# Patient Record
Sex: Male | Born: 1945
Health system: Southern US, Community
[De-identification: ages and names within clinical notes are randomized; demographics above are authoritative.]

## PROBLEM LIST (undated history)

## (undated) DIAGNOSIS — E785 Hyperlipidemia, unspecified: Secondary | ICD-10-CM

## (undated) DIAGNOSIS — E119 Type 2 diabetes mellitus without complications: Secondary | ICD-10-CM

## (undated) DIAGNOSIS — I2510 Atherosclerotic heart disease of native coronary artery without angina pectoris: Secondary | ICD-10-CM

## (undated) DIAGNOSIS — I10 Essential (primary) hypertension: Secondary | ICD-10-CM

## (undated) HISTORY — DX: Hyperlipidemia, unspecified: E78.5

## (undated) HISTORY — PX: BACK SURGERY: SHX140

## (undated) HISTORY — PX: CHOLECYSTECTOMY: SHX55

## (undated) HISTORY — PX: APPENDECTOMY: SHX54

---

## 1996-07-31 HISTORY — PX: CORONARY ANGIOPLASTY WITH STENT PLACEMENT: SHX49

## 2015-09-02 DIAGNOSIS — M79661 Pain in right lower leg: Secondary | ICD-10-CM | POA: Diagnosis not present

## 2015-09-02 DIAGNOSIS — Z6827 Body mass index (BMI) 27.0-27.9, adult: Secondary | ICD-10-CM | POA: Diagnosis not present

## 2015-09-02 DIAGNOSIS — D509 Iron deficiency anemia, unspecified: Secondary | ICD-10-CM | POA: Diagnosis not present

## 2015-09-02 DIAGNOSIS — E785 Hyperlipidemia, unspecified: Secondary | ICD-10-CM | POA: Diagnosis not present

## 2015-09-02 DIAGNOSIS — I10 Essential (primary) hypertension: Secondary | ICD-10-CM | POA: Diagnosis not present

## 2015-09-02 DIAGNOSIS — R609 Edema, unspecified: Secondary | ICD-10-CM | POA: Diagnosis not present

## 2015-09-02 DIAGNOSIS — M7989 Other specified soft tissue disorders: Secondary | ICD-10-CM | POA: Diagnosis not present

## 2015-09-02 DIAGNOSIS — Z23 Encounter for immunization: Secondary | ICD-10-CM | POA: Diagnosis not present

## 2015-09-02 DIAGNOSIS — E1140 Type 2 diabetes mellitus with diabetic neuropathy, unspecified: Secondary | ICD-10-CM | POA: Diagnosis not present

## 2015-12-08 DIAGNOSIS — D509 Iron deficiency anemia, unspecified: Secondary | ICD-10-CM | POA: Diagnosis not present

## 2015-12-08 DIAGNOSIS — Z139 Encounter for screening, unspecified: Secondary | ICD-10-CM | POA: Diagnosis not present

## 2015-12-08 DIAGNOSIS — K529 Noninfective gastroenteritis and colitis, unspecified: Secondary | ICD-10-CM | POA: Diagnosis not present

## 2015-12-08 DIAGNOSIS — I10 Essential (primary) hypertension: Secondary | ICD-10-CM | POA: Diagnosis not present

## 2015-12-08 DIAGNOSIS — E1140 Type 2 diabetes mellitus with diabetic neuropathy, unspecified: Secondary | ICD-10-CM | POA: Diagnosis not present

## 2015-12-08 DIAGNOSIS — E663 Overweight: Secondary | ICD-10-CM | POA: Diagnosis not present

## 2015-12-08 DIAGNOSIS — E785 Hyperlipidemia, unspecified: Secondary | ICD-10-CM | POA: Diagnosis not present

## 2015-12-08 DIAGNOSIS — Z6826 Body mass index (BMI) 26.0-26.9, adult: Secondary | ICD-10-CM | POA: Diagnosis not present

## 2015-12-08 DIAGNOSIS — G4762 Sleep related leg cramps: Secondary | ICD-10-CM | POA: Diagnosis not present

## 2015-12-23 DIAGNOSIS — R480 Dyslexia and alexia: Secondary | ICD-10-CM | POA: Diagnosis not present

## 2015-12-23 DIAGNOSIS — F419 Anxiety disorder, unspecified: Secondary | ICD-10-CM | POA: Diagnosis not present

## 2015-12-23 DIAGNOSIS — M5136 Other intervertebral disc degeneration, lumbar region: Secondary | ICD-10-CM | POA: Diagnosis not present

## 2015-12-23 DIAGNOSIS — M159 Polyosteoarthritis, unspecified: Secondary | ICD-10-CM | POA: Diagnosis not present

## 2015-12-23 DIAGNOSIS — M5417 Radiculopathy, lumbosacral region: Secondary | ICD-10-CM | POA: Diagnosis not present

## 2016-01-20 DIAGNOSIS — N132 Hydronephrosis with renal and ureteral calculous obstruction: Secondary | ICD-10-CM | POA: Diagnosis not present

## 2016-01-20 DIAGNOSIS — N23 Unspecified renal colic: Secondary | ICD-10-CM | POA: Diagnosis not present

## 2016-01-20 DIAGNOSIS — M47815 Spondylosis without myelopathy or radiculopathy, thoracolumbar region: Secondary | ICD-10-CM | POA: Diagnosis not present

## 2016-01-20 DIAGNOSIS — I252 Old myocardial infarction: Secondary | ICD-10-CM | POA: Diagnosis not present

## 2016-01-20 DIAGNOSIS — K429 Umbilical hernia without obstruction or gangrene: Secondary | ICD-10-CM | POA: Diagnosis not present

## 2016-01-20 DIAGNOSIS — N281 Cyst of kidney, acquired: Secondary | ICD-10-CM | POA: Diagnosis not present

## 2016-01-20 DIAGNOSIS — Q438 Other specified congenital malformations of intestine: Secondary | ICD-10-CM | POA: Diagnosis not present

## 2016-01-20 DIAGNOSIS — N200 Calculus of kidney: Secondary | ICD-10-CM | POA: Diagnosis not present

## 2016-01-20 DIAGNOSIS — N134 Hydroureter: Secondary | ICD-10-CM | POA: Diagnosis not present

## 2016-01-20 DIAGNOSIS — R161 Splenomegaly, not elsewhere classified: Secondary | ICD-10-CM | POA: Diagnosis not present

## 2016-01-20 DIAGNOSIS — J449 Chronic obstructive pulmonary disease, unspecified: Secondary | ICD-10-CM | POA: Diagnosis not present

## 2016-01-20 DIAGNOSIS — R112 Nausea with vomiting, unspecified: Secondary | ICD-10-CM | POA: Diagnosis not present

## 2016-01-20 DIAGNOSIS — I10 Essential (primary) hypertension: Secondary | ICD-10-CM | POA: Diagnosis not present

## 2016-01-20 DIAGNOSIS — K7689 Other specified diseases of liver: Secondary | ICD-10-CM | POA: Diagnosis not present

## 2016-01-20 DIAGNOSIS — E119 Type 2 diabetes mellitus without complications: Secondary | ICD-10-CM | POA: Diagnosis not present

## 2016-01-20 DIAGNOSIS — R1032 Left lower quadrant pain: Secondary | ICD-10-CM | POA: Diagnosis not present

## 2016-02-14 DIAGNOSIS — R1032 Left lower quadrant pain: Secondary | ICD-10-CM | POA: Diagnosis not present

## 2016-02-14 DIAGNOSIS — N200 Calculus of kidney: Secondary | ICD-10-CM | POA: Diagnosis not present

## 2016-03-16 DIAGNOSIS — Z79899 Other long term (current) drug therapy: Secondary | ICD-10-CM | POA: Diagnosis not present

## 2016-03-16 DIAGNOSIS — D509 Iron deficiency anemia, unspecified: Secondary | ICD-10-CM | POA: Diagnosis not present

## 2016-03-16 DIAGNOSIS — E1140 Type 2 diabetes mellitus with diabetic neuropathy, unspecified: Secondary | ICD-10-CM | POA: Diagnosis not present

## 2016-03-16 DIAGNOSIS — E785 Hyperlipidemia, unspecified: Secondary | ICD-10-CM | POA: Diagnosis not present

## 2016-03-16 DIAGNOSIS — I10 Essential (primary) hypertension: Secondary | ICD-10-CM | POA: Diagnosis not present

## 2016-03-16 DIAGNOSIS — Z9181 History of falling: Secondary | ICD-10-CM | POA: Diagnosis not present

## 2016-03-16 DIAGNOSIS — Z125 Encounter for screening for malignant neoplasm of prostate: Secondary | ICD-10-CM | POA: Diagnosis not present

## 2016-03-16 DIAGNOSIS — Z1389 Encounter for screening for other disorder: Secondary | ICD-10-CM | POA: Diagnosis not present

## 2016-03-16 DIAGNOSIS — Z6825 Body mass index (BMI) 25.0-25.9, adult: Secondary | ICD-10-CM | POA: Diagnosis not present

## 2016-06-19 DIAGNOSIS — D509 Iron deficiency anemia, unspecified: Secondary | ICD-10-CM | POA: Diagnosis not present

## 2016-06-19 DIAGNOSIS — E785 Hyperlipidemia, unspecified: Secondary | ICD-10-CM | POA: Diagnosis not present

## 2016-06-19 DIAGNOSIS — K529 Noninfective gastroenteritis and colitis, unspecified: Secondary | ICD-10-CM | POA: Diagnosis not present

## 2016-06-19 DIAGNOSIS — I10 Essential (primary) hypertension: Secondary | ICD-10-CM | POA: Diagnosis not present

## 2016-06-19 DIAGNOSIS — Z6826 Body mass index (BMI) 26.0-26.9, adult: Secondary | ICD-10-CM | POA: Diagnosis not present

## 2016-06-19 DIAGNOSIS — E1140 Type 2 diabetes mellitus with diabetic neuropathy, unspecified: Secondary | ICD-10-CM | POA: Diagnosis not present

## 2016-06-27 DIAGNOSIS — M5136 Other intervertebral disc degeneration, lumbar region: Secondary | ICD-10-CM

## 2016-06-27 DIAGNOSIS — F419 Anxiety disorder, unspecified: Secondary | ICD-10-CM | POA: Insufficient documentation

## 2016-06-27 DIAGNOSIS — M5417 Radiculopathy, lumbosacral region: Secondary | ICD-10-CM | POA: Insufficient documentation

## 2016-06-27 DIAGNOSIS — R480 Dyslexia and alexia: Secondary | ICD-10-CM | POA: Insufficient documentation

## 2016-06-27 DIAGNOSIS — M51369 Other intervertebral disc degeneration, lumbar region without mention of lumbar back pain or lower extremity pain: Secondary | ICD-10-CM | POA: Insufficient documentation

## 2016-06-27 DIAGNOSIS — M159 Polyosteoarthritis, unspecified: Secondary | ICD-10-CM

## 2016-06-27 HISTORY — DX: Other intervertebral disc degeneration, lumbar region: M51.36

## 2016-06-27 HISTORY — DX: Other intervertebral disc degeneration, lumbar region without mention of lumbar back pain or lower extremity pain: M51.369

## 2016-06-27 HISTORY — DX: Radiculopathy, lumbosacral region: M54.17

## 2016-06-27 HISTORY — DX: Dyslexia and alexia: R48.0

## 2016-06-27 HISTORY — DX: Polyosteoarthritis, unspecified: M15.9

## 2016-06-27 HISTORY — DX: Anxiety disorder, unspecified: F41.9

## 2016-09-28 DIAGNOSIS — I10 Essential (primary) hypertension: Secondary | ICD-10-CM | POA: Diagnosis not present

## 2016-09-28 DIAGNOSIS — E1140 Type 2 diabetes mellitus with diabetic neuropathy, unspecified: Secondary | ICD-10-CM | POA: Diagnosis not present

## 2016-09-28 DIAGNOSIS — K219 Gastro-esophageal reflux disease without esophagitis: Secondary | ICD-10-CM | POA: Diagnosis not present

## 2016-09-28 DIAGNOSIS — E785 Hyperlipidemia, unspecified: Secondary | ICD-10-CM | POA: Diagnosis not present

## 2016-09-28 DIAGNOSIS — Z6825 Body mass index (BMI) 25.0-25.9, adult: Secondary | ICD-10-CM | POA: Diagnosis not present

## 2016-09-28 DIAGNOSIS — D509 Iron deficiency anemia, unspecified: Secondary | ICD-10-CM | POA: Diagnosis not present

## 2016-12-26 DIAGNOSIS — R480 Dyslexia and alexia: Secondary | ICD-10-CM | POA: Diagnosis not present

## 2016-12-26 DIAGNOSIS — M159 Polyosteoarthritis, unspecified: Secondary | ICD-10-CM | POA: Diagnosis not present

## 2016-12-26 DIAGNOSIS — M5417 Radiculopathy, lumbosacral region: Secondary | ICD-10-CM | POA: Diagnosis not present

## 2016-12-26 DIAGNOSIS — F419 Anxiety disorder, unspecified: Secondary | ICD-10-CM | POA: Diagnosis not present

## 2016-12-26 DIAGNOSIS — M5136 Other intervertebral disc degeneration, lumbar region: Secondary | ICD-10-CM | POA: Diagnosis not present

## 2017-01-11 DIAGNOSIS — E785 Hyperlipidemia, unspecified: Secondary | ICD-10-CM | POA: Diagnosis not present

## 2017-01-11 DIAGNOSIS — K219 Gastro-esophageal reflux disease without esophagitis: Secondary | ICD-10-CM | POA: Diagnosis not present

## 2017-01-11 DIAGNOSIS — I10 Essential (primary) hypertension: Secondary | ICD-10-CM | POA: Diagnosis not present

## 2017-01-11 DIAGNOSIS — E1140 Type 2 diabetes mellitus with diabetic neuropathy, unspecified: Secondary | ICD-10-CM | POA: Diagnosis not present

## 2017-01-11 DIAGNOSIS — Z6825 Body mass index (BMI) 25.0-25.9, adult: Secondary | ICD-10-CM | POA: Diagnosis not present

## 2017-01-11 DIAGNOSIS — Z139 Encounter for screening, unspecified: Secondary | ICD-10-CM | POA: Diagnosis not present

## 2017-01-11 DIAGNOSIS — D509 Iron deficiency anemia, unspecified: Secondary | ICD-10-CM | POA: Diagnosis not present

## 2017-04-27 DIAGNOSIS — E1140 Type 2 diabetes mellitus with diabetic neuropathy, unspecified: Secondary | ICD-10-CM | POA: Diagnosis not present

## 2017-04-27 DIAGNOSIS — I10 Essential (primary) hypertension: Secondary | ICD-10-CM | POA: Diagnosis not present

## 2017-04-27 DIAGNOSIS — D509 Iron deficiency anemia, unspecified: Secondary | ICD-10-CM | POA: Diagnosis not present

## 2017-04-27 DIAGNOSIS — K219 Gastro-esophageal reflux disease without esophagitis: Secondary | ICD-10-CM | POA: Diagnosis not present

## 2017-04-27 DIAGNOSIS — Z1389 Encounter for screening for other disorder: Secondary | ICD-10-CM | POA: Diagnosis not present

## 2017-04-27 DIAGNOSIS — Z9181 History of falling: Secondary | ICD-10-CM | POA: Diagnosis not present

## 2017-04-27 DIAGNOSIS — E785 Hyperlipidemia, unspecified: Secondary | ICD-10-CM | POA: Diagnosis not present

## 2017-04-27 DIAGNOSIS — Z125 Encounter for screening for malignant neoplasm of prostate: Secondary | ICD-10-CM | POA: Diagnosis not present

## 2017-04-27 DIAGNOSIS — Z139 Encounter for screening, unspecified: Secondary | ICD-10-CM | POA: Diagnosis not present

## 2017-08-10 DIAGNOSIS — Z6825 Body mass index (BMI) 25.0-25.9, adult: Secondary | ICD-10-CM | POA: Diagnosis not present

## 2017-08-10 DIAGNOSIS — E1140 Type 2 diabetes mellitus with diabetic neuropathy, unspecified: Secondary | ICD-10-CM | POA: Diagnosis not present

## 2017-08-10 DIAGNOSIS — E785 Hyperlipidemia, unspecified: Secondary | ICD-10-CM | POA: Diagnosis not present

## 2017-08-10 DIAGNOSIS — D509 Iron deficiency anemia, unspecified: Secondary | ICD-10-CM | POA: Diagnosis not present

## 2017-08-10 DIAGNOSIS — I10 Essential (primary) hypertension: Secondary | ICD-10-CM | POA: Diagnosis not present

## 2017-08-10 DIAGNOSIS — K219 Gastro-esophageal reflux disease without esophagitis: Secondary | ICD-10-CM | POA: Diagnosis not present

## 2017-10-19 DIAGNOSIS — I10 Essential (primary) hypertension: Secondary | ICD-10-CM | POA: Diagnosis not present

## 2017-10-19 DIAGNOSIS — E782 Mixed hyperlipidemia: Secondary | ICD-10-CM | POA: Diagnosis not present

## 2017-10-19 DIAGNOSIS — E559 Vitamin D deficiency, unspecified: Secondary | ICD-10-CM | POA: Diagnosis not present

## 2017-10-19 DIAGNOSIS — Z79899 Other long term (current) drug therapy: Secondary | ICD-10-CM | POA: Diagnosis not present

## 2017-10-19 DIAGNOSIS — E038 Other specified hypothyroidism: Secondary | ICD-10-CM | POA: Diagnosis not present

## 2017-10-19 DIAGNOSIS — E1165 Type 2 diabetes mellitus with hyperglycemia: Secondary | ICD-10-CM | POA: Diagnosis not present

## 2017-10-19 DIAGNOSIS — Z0000 Encounter for general adult medical examination without abnormal findings: Secondary | ICD-10-CM | POA: Diagnosis not present

## 2017-10-19 DIAGNOSIS — N401 Enlarged prostate with lower urinary tract symptoms: Secondary | ICD-10-CM | POA: Diagnosis not present

## 2017-10-19 DIAGNOSIS — D518 Other vitamin B12 deficiency anemias: Secondary | ICD-10-CM | POA: Diagnosis not present

## 2017-11-09 DIAGNOSIS — I10 Essential (primary) hypertension: Secondary | ICD-10-CM | POA: Diagnosis not present

## 2017-11-09 DIAGNOSIS — K219 Gastro-esophageal reflux disease without esophagitis: Secondary | ICD-10-CM | POA: Diagnosis not present

## 2017-11-09 DIAGNOSIS — D509 Iron deficiency anemia, unspecified: Secondary | ICD-10-CM | POA: Diagnosis not present

## 2017-11-09 DIAGNOSIS — Z6825 Body mass index (BMI) 25.0-25.9, adult: Secondary | ICD-10-CM | POA: Diagnosis not present

## 2017-11-09 DIAGNOSIS — E1140 Type 2 diabetes mellitus with diabetic neuropathy, unspecified: Secondary | ICD-10-CM | POA: Diagnosis not present

## 2017-11-09 DIAGNOSIS — M25512 Pain in left shoulder: Secondary | ICD-10-CM | POA: Diagnosis not present

## 2017-11-09 DIAGNOSIS — E785 Hyperlipidemia, unspecified: Secondary | ICD-10-CM | POA: Diagnosis not present

## 2018-02-22 DIAGNOSIS — D509 Iron deficiency anemia, unspecified: Secondary | ICD-10-CM | POA: Diagnosis not present

## 2018-02-22 DIAGNOSIS — E785 Hyperlipidemia, unspecified: Secondary | ICD-10-CM | POA: Diagnosis not present

## 2018-02-22 DIAGNOSIS — E1140 Type 2 diabetes mellitus with diabetic neuropathy, unspecified: Secondary | ICD-10-CM | POA: Diagnosis not present

## 2018-05-28 DIAGNOSIS — F1010 Alcohol abuse, uncomplicated: Secondary | ICD-10-CM | POA: Diagnosis not present

## 2018-05-28 DIAGNOSIS — Z9181 History of falling: Secondary | ICD-10-CM | POA: Diagnosis not present

## 2018-05-28 DIAGNOSIS — Z139 Encounter for screening, unspecified: Secondary | ICD-10-CM | POA: Diagnosis not present

## 2018-05-28 DIAGNOSIS — I10 Essential (primary) hypertension: Secondary | ICD-10-CM | POA: Diagnosis not present

## 2018-05-28 DIAGNOSIS — Z1331 Encounter for screening for depression: Secondary | ICD-10-CM | POA: Diagnosis not present

## 2018-05-28 DIAGNOSIS — K219 Gastro-esophageal reflux disease without esophagitis: Secondary | ICD-10-CM | POA: Diagnosis not present

## 2018-05-28 DIAGNOSIS — D509 Iron deficiency anemia, unspecified: Secondary | ICD-10-CM | POA: Diagnosis not present

## 2018-05-28 DIAGNOSIS — E1140 Type 2 diabetes mellitus with diabetic neuropathy, unspecified: Secondary | ICD-10-CM | POA: Diagnosis not present

## 2018-05-28 DIAGNOSIS — E785 Hyperlipidemia, unspecified: Secondary | ICD-10-CM | POA: Diagnosis not present

## 2018-05-28 DIAGNOSIS — Z1339 Encounter for screening examination for other mental health and behavioral disorders: Secondary | ICD-10-CM | POA: Diagnosis not present

## 2018-11-19 ENCOUNTER — Emergency Department (HOSPITAL_COMMUNITY): Payer: PPO

## 2018-11-19 ENCOUNTER — Inpatient Hospital Stay (HOSPITAL_COMMUNITY)
Admission: EM | Disposition: A | Discharge status: Home Health | Payer: Self-pay | Source: Home / Self Care | Attending: Surgery

## 2018-11-19 ENCOUNTER — Other Ambulatory Visit: Payer: Self-pay

## 2018-11-19 ENCOUNTER — Inpatient Hospital Stay (HOSPITAL_COMMUNITY)
Admission: EM | Admit: 2018-11-19 | Discharge: 2018-11-27 | DRG: 234 | Disposition: A | Discharge status: Home Health | Payer: PPO | Attending: Surgery | Admitting: Surgery

## 2018-11-19 ENCOUNTER — Inpatient Hospital Stay (HOSPITAL_COMMUNITY): Payer: PPO

## 2018-11-19 ENCOUNTER — Encounter (HOSPITAL_COMMUNITY): Payer: Self-pay | Admitting: Emergency Medicine

## 2018-11-19 ENCOUNTER — Other Ambulatory Visit (HOSPITAL_COMMUNITY): Payer: PPO

## 2018-11-19 DIAGNOSIS — Z955 Presence of coronary angioplasty implant and graft: Secondary | ICD-10-CM

## 2018-11-19 DIAGNOSIS — F17220 Nicotine dependence, chewing tobacco, uncomplicated: Secondary | ICD-10-CM | POA: Diagnosis not present

## 2018-11-19 DIAGNOSIS — K429 Umbilical hernia without obstruction or gangrene: Secondary | ICD-10-CM | POA: Diagnosis present

## 2018-11-19 DIAGNOSIS — Z7982 Long term (current) use of aspirin: Secondary | ICD-10-CM

## 2018-11-19 DIAGNOSIS — D62 Acute posthemorrhagic anemia: Secondary | ICD-10-CM | POA: Diagnosis not present

## 2018-11-19 DIAGNOSIS — J9811 Atelectasis: Secondary | ICD-10-CM | POA: Diagnosis not present

## 2018-11-19 DIAGNOSIS — I252 Old myocardial infarction: Secondary | ICD-10-CM | POA: Diagnosis not present

## 2018-11-19 DIAGNOSIS — Z7984 Long term (current) use of oral hypoglycemic drugs: Secondary | ICD-10-CM

## 2018-11-19 DIAGNOSIS — E119 Type 2 diabetes mellitus without complications: Secondary | ICD-10-CM | POA: Diagnosis not present

## 2018-11-19 DIAGNOSIS — I2510 Atherosclerotic heart disease of native coronary artery without angina pectoris: Secondary | ICD-10-CM | POA: Diagnosis not present

## 2018-11-19 DIAGNOSIS — I10 Essential (primary) hypertension: Secondary | ICD-10-CM

## 2018-11-19 DIAGNOSIS — I313 Pericardial effusion (noninflammatory): Secondary | ICD-10-CM | POA: Diagnosis not present

## 2018-11-19 DIAGNOSIS — I214 Non-ST elevation (NSTEMI) myocardial infarction: Secondary | ICD-10-CM | POA: Diagnosis not present

## 2018-11-19 DIAGNOSIS — I351 Nonrheumatic aortic (valve) insufficiency: Secondary | ICD-10-CM | POA: Diagnosis not present

## 2018-11-19 DIAGNOSIS — D696 Thrombocytopenia, unspecified: Secondary | ICD-10-CM | POA: Diagnosis not present

## 2018-11-19 DIAGNOSIS — I25110 Atherosclerotic heart disease of native coronary artery with unstable angina pectoris: Secondary | ICD-10-CM | POA: Diagnosis not present

## 2018-11-19 DIAGNOSIS — R079 Chest pain, unspecified: Secondary | ICD-10-CM

## 2018-11-19 DIAGNOSIS — Z880 Allergy status to penicillin: Secondary | ICD-10-CM | POA: Diagnosis not present

## 2018-11-19 DIAGNOSIS — Z951 Presence of aortocoronary bypass graft: Secondary | ICD-10-CM

## 2018-11-19 DIAGNOSIS — Z09 Encounter for follow-up examination after completed treatment for conditions other than malignant neoplasm: Secondary | ICD-10-CM

## 2018-11-19 DIAGNOSIS — Z01810 Encounter for preprocedural cardiovascular examination: Secondary | ICD-10-CM | POA: Diagnosis not present

## 2018-11-19 HISTORY — DX: Type 2 diabetes mellitus without complications: E11.9

## 2018-11-19 HISTORY — DX: Non-ST elevation (NSTEMI) myocardial infarction: I21.4

## 2018-11-19 HISTORY — PX: LEFT HEART CATH AND CORONARY ANGIOGRAPHY: CATH118249

## 2018-11-19 HISTORY — DX: Essential (primary) hypertension: I10

## 2018-11-19 LAB — GLUCOSE, CAPILLARY
Glucose-Capillary: 103 mg/dL — ABNORMAL HIGH (ref 70–99)
Glucose-Capillary: 150 mg/dL — ABNORMAL HIGH (ref 70–99)

## 2018-11-19 LAB — CBC
HCT: 44.5 % (ref 39.0–52.0)
Hemoglobin: 14.9 g/dL (ref 13.0–17.0)
MCH: 29.7 pg (ref 26.0–34.0)
MCHC: 33.5 g/dL (ref 30.0–36.0)
MCV: 88.8 fL (ref 80.0–100.0)
Platelets: 126 10*3/uL — ABNORMAL LOW (ref 150–400)
RBC: 5.01 MIL/uL (ref 4.22–5.81)
RDW: 12.3 % (ref 11.5–15.5)
WBC: 7.1 10*3/uL (ref 4.0–10.5)
nRBC: 0 % (ref 0.0–0.2)

## 2018-11-19 LAB — ECHOCARDIOGRAM LIMITED
Height: 69 in
Weight: 2720 oz

## 2018-11-19 LAB — BASIC METABOLIC PANEL
Anion gap: 12 (ref 5–15)
BUN: 18 mg/dL (ref 8–23)
CO2: 19 mmol/L — ABNORMAL LOW (ref 22–32)
Calcium: 8.5 mg/dL — ABNORMAL LOW (ref 8.9–10.3)
Chloride: 110 mmol/L (ref 98–111)
Creatinine, Ser: 0.95 mg/dL (ref 0.61–1.24)
GFR calc Af Amer: >60 mL/min (ref >60)
GFR calc non Af Amer: >60 mL/min (ref >60)
Glucose, Bld: 263 mg/dL — ABNORMAL HIGH (ref 70–99)
Potassium: 3.7 mmol/L (ref 3.5–5.1)
Sodium: 141 mmol/L (ref 135–145)

## 2018-11-19 LAB — POCT ACTIVATED CLOTTING TIME: Activated Clotting Time: 180 seconds

## 2018-11-19 LAB — TROPONIN I: Troponin I: 0.3 ng/mL (ref <0.03)

## 2018-11-19 SURGERY — LEFT HEART CATH AND CORONARY ANGIOGRAPHY
Anesthesia: LOCAL

## 2018-11-19 MED ORDER — ONDANSETRON HCL 4 MG/2ML IJ SOLN: 4.0000 mg | Freq: Four times a day (QID) | INTRAMUSCULAR | Status: DC | PRN

## 2018-11-19 MED ORDER — HEPARIN SODIUM (PORCINE) 1000 UNIT/ML IJ SOLN
INTRAMUSCULAR | Status: AC
  Filled 2018-11-19: qty 1

## 2018-11-19 MED ORDER — IOHEXOL 350 MG/ML SOLN
INTRAVENOUS | Status: DC | PRN
  Administered 2018-11-19: 75 mL via INTRA_ARTERIAL

## 2018-11-19 MED ORDER — ASPIRIN 81 MG PO CHEW: 324.0000 mg | CHEWABLE_TABLET | ORAL | Status: DC

## 2018-11-19 MED ORDER — VERAPAMIL HCL 2.5 MG/ML IV SOLN
INTRAVENOUS | Status: AC
  Filled 2018-11-19: qty 2

## 2018-11-19 MED ORDER — ACETAMINOPHEN 325 MG PO TABS: 650.0000 mg | ORAL_TABLET | ORAL | Status: DC | PRN

## 2018-11-19 MED ORDER — NITROGLYCERIN 0.4 MG SL SUBL
0.4000 mg | SUBLINGUAL_TABLET | SUBLINGUAL | Status: DC | PRN
  Filled 2018-11-19: qty 1

## 2018-11-19 MED ORDER — SODIUM CHLORIDE 0.9% FLUSH: 3.0000 mL | INTRAVENOUS | Status: DC | PRN

## 2018-11-19 MED ORDER — SODIUM CHLORIDE 0.9 % IV SOLN
INTRAVENOUS | Status: AC | PRN
  Administered 2018-11-19: 10 mL/h via INTRAVENOUS

## 2018-11-19 MED ORDER — SODIUM CHLORIDE 0.9% FLUSH
3.0000 mL | Freq: Two times a day (BID) | INTRAVENOUS | Status: DC
  Administered 2018-11-20 (×2): 3 mL via INTRAVENOUS

## 2018-11-19 MED ORDER — SODIUM CHLORIDE 0.9 % IV SOLN: INTRAVENOUS | Status: DC

## 2018-11-19 MED ORDER — ASPIRIN 81 MG PO CHEW
324.0000 mg | CHEWABLE_TABLET | Freq: Once | ORAL | Status: AC
  Administered 2018-11-19: 324 mg via ORAL
  Filled 2018-11-19: qty 4

## 2018-11-19 MED ORDER — LABETALOL HCL 5 MG/ML IV SOLN: 10.0000 mg | INTRAVENOUS | Status: AC | PRN

## 2018-11-19 MED ORDER — ATORVASTATIN CALCIUM 80 MG PO TABS
80.0000 mg | ORAL_TABLET | Freq: Every day | ORAL | Status: DC
  Administered 2018-11-19 – 2018-11-26 (×7): 80 mg via ORAL
  Filled 2018-11-19 (×8): qty 1

## 2018-11-19 MED ORDER — HYDRALAZINE HCL 20 MG/ML IJ SOLN: 10.0000 mg | INTRAMUSCULAR | Status: AC | PRN

## 2018-11-19 MED ORDER — HEPARIN (PORCINE) 25000 UT/250ML-% IV SOLN
900.0000 [IU]/h | INTRAVENOUS | Status: DC
  Administered 2018-11-20: 950 [IU]/h via INTRAVENOUS
  Filled 2018-11-19: qty 250

## 2018-11-19 MED ORDER — ZOLPIDEM TARTRATE 5 MG PO TABS: 5.0000 mg | ORAL_TABLET | Freq: Every evening | ORAL | Status: DC | PRN

## 2018-11-19 MED ORDER — AMLODIPINE BESYLATE 10 MG PO TABS
10.0000 mg | ORAL_TABLET | Freq: Every day | ORAL | Status: DC
  Administered 2018-11-19 – 2018-11-20 (×2): 10 mg via ORAL
  Filled 2018-11-19 (×2): qty 1

## 2018-11-19 MED ORDER — HEPARIN (PORCINE) 25000 UT/250ML-% IV SOLN
950.0000 [IU]/h | INTRAVENOUS | Status: DC
  Administered 2018-11-19: 950 [IU]/h via INTRAVENOUS
  Filled 2018-11-19: qty 250

## 2018-11-19 MED ORDER — MIDAZOLAM HCL 2 MG/2ML IJ SOLN
INTRAMUSCULAR | Status: AC
  Filled 2018-11-19: qty 2

## 2018-11-19 MED ORDER — ASPIRIN 81 MG PO CHEW: 81.0000 mg | CHEWABLE_TABLET | Freq: Every day | ORAL | Status: DC

## 2018-11-19 MED ORDER — HEPARIN BOLUS VIA INFUSION
4000.0000 [IU] | Freq: Once | INTRAVENOUS | Status: AC
  Administered 2018-11-19: 4000 [IU] via INTRAVENOUS
  Filled 2018-11-19: qty 4000

## 2018-11-19 MED ORDER — CARVEDILOL 6.25 MG PO TABS
6.2500 mg | ORAL_TABLET | Freq: Two times a day (BID) | ORAL | Status: DC
  Administered 2018-11-19 – 2018-11-20 (×3): 6.25 mg via ORAL
  Filled 2018-11-19 (×3): qty 1

## 2018-11-19 MED ORDER — NITROGLYCERIN IN D5W 200-5 MCG/ML-% IV SOLN: 0.0000 ug/min | INTRAVENOUS | Status: DC

## 2018-11-19 MED ORDER — ASPIRIN EC 81 MG PO TBEC
81.0000 mg | DELAYED_RELEASE_TABLET | Freq: Every day | ORAL | Status: DC
  Administered 2018-11-20: 81 mg via ORAL
  Filled 2018-11-19: qty 1

## 2018-11-19 MED ORDER — SODIUM CHLORIDE 0.9 % IV SOLN
250.0000 mL | INTRAVENOUS | Status: DC | PRN
  Administered 2018-11-21: 12:00:00 via INTRAVENOUS

## 2018-11-19 MED ORDER — NITROGLYCERIN IN D5W 200-5 MCG/ML-% IV SOLN
INTRAVENOUS | Status: AC | PRN
  Administered 2018-11-19: 10 ug/min via INTRAVENOUS

## 2018-11-19 MED ORDER — SODIUM CHLORIDE 0.9% FLUSH: 3.0000 mL | Freq: Once | INTRAVENOUS | Status: DC

## 2018-11-19 MED ORDER — FERROUS SULFATE 325 (65 FE) MG PO TABS
325.0000 mg | ORAL_TABLET | Freq: Every day | ORAL | Status: DC
  Administered 2018-11-19 – 2018-11-20 (×2): 325 mg via ORAL
  Filled 2018-11-19 (×2): qty 1

## 2018-11-19 MED ORDER — LIDOCAINE HCL (PF) 1 % IJ SOLN
INTRAMUSCULAR | Status: AC
  Filled 2018-11-19: qty 30

## 2018-11-19 MED ORDER — PANTOPRAZOLE SODIUM 40 MG PO TBEC
40.0000 mg | DELAYED_RELEASE_TABLET | Freq: Every day | ORAL | Status: DC
  Administered 2018-11-20: 40 mg via ORAL
  Filled 2018-11-19: qty 1

## 2018-11-19 MED ORDER — FENTANYL CITRATE (PF) 100 MCG/2ML IJ SOLN
INTRAMUSCULAR | Status: DC | PRN
  Administered 2018-11-19: 25 ug via INTRAVENOUS

## 2018-11-19 MED ORDER — NITROGLYCERIN IN D5W 200-5 MCG/ML-% IV SOLN
INTRAVENOUS | Status: AC
  Filled 2018-11-19: qty 250

## 2018-11-19 MED ORDER — LISINOPRIL 40 MG PO TABS
40.0000 mg | ORAL_TABLET | Freq: Every day | ORAL | Status: DC
  Administered 2018-11-20: 40 mg via ORAL
  Filled 2018-11-19: qty 1

## 2018-11-19 MED ORDER — ASPIRIN 300 MG RE SUPP: 300.0000 mg | RECTAL | Status: DC

## 2018-11-19 MED ORDER — DIAZEPAM 5 MG PO TABS: 5.0000 mg | ORAL_TABLET | Freq: Four times a day (QID) | ORAL | Status: DC | PRN

## 2018-11-19 MED ORDER — INSULIN ASPART 100 UNIT/ML ~~LOC~~ SOLN
0.0000 [IU] | Freq: Three times a day (TID) | SUBCUTANEOUS | Status: DC
  Administered 2018-11-20 (×2): 3 [IU] via SUBCUTANEOUS
  Administered 2018-11-20: 2 [IU] via SUBCUTANEOUS

## 2018-11-19 MED ORDER — FENTANYL CITRATE (PF) 100 MCG/2ML IJ SOLN
INTRAMUSCULAR | Status: AC
  Filled 2018-11-19: qty 2

## 2018-11-19 MED ORDER — MIDAZOLAM HCL 2 MG/2ML IJ SOLN
INTRAMUSCULAR | Status: DC | PRN
  Administered 2018-11-19: 1 mg via INTRAVENOUS

## 2018-11-19 MED ORDER — HEPARIN (PORCINE) IN NACL 1000-0.9 UT/500ML-% IV SOLN
INTRAVENOUS | Status: AC
  Filled 2018-11-19: qty 1000

## 2018-11-19 MED ORDER — LIDOCAINE HCL (PF) 1 % IJ SOLN
INTRAMUSCULAR | Status: DC | PRN
  Administered 2018-11-19: 2 mL

## 2018-11-19 MED ORDER — VERAPAMIL HCL 2.5 MG/ML IV SOLN
INTRAVENOUS | Status: DC | PRN
  Administered 2018-11-19: 17:00:00 via INTRA_ARTERIAL

## 2018-11-19 MED ORDER — HEPARIN SODIUM (PORCINE) 1000 UNIT/ML IJ SOLN
INTRAMUSCULAR | Status: DC | PRN
  Administered 2018-11-19: 2000 [IU] via INTRAVENOUS

## 2018-11-19 MED ORDER — DIPHENOXYLATE-ATROPINE 2.5-0.025 MG PO TABS: 1.0000 | ORAL_TABLET | Freq: Four times a day (QID) | ORAL | Status: DC | PRN

## 2018-11-19 SURGICAL SUPPLY — 12 items
CATH INFINITI 5FR ANG PIGTAIL (CATHETERS) ×1 IMPLANT
CATH OPTITORQUE TIG 4.0 5F (CATHETERS) ×1 IMPLANT
COVER DOME SNAP 22 D (MISCELLANEOUS) ×1 IMPLANT
DEVICE RAD COMP TR BAND LRG (VASCULAR PRODUCTS) ×1 IMPLANT
GLIDESHEATH SLEND SS 6F .021 (SHEATH) ×1 IMPLANT
GUIDEWIRE INQWIRE 1.5J.035X260 (WIRE) IMPLANT
INQWIRE 1.5J .035X260CM (WIRE) ×2
KIT HEART LEFT (KITS) ×2 IMPLANT
PACK CARDIAC CATHETERIZATION (CUSTOM PROCEDURE TRAY) ×2 IMPLANT
SYR MEDRAD MARK 7 150ML (SYRINGE) ×2 IMPLANT
TRANSDUCER W/STOPCOCK (MISCELLANEOUS) ×2 IMPLANT
TUBING CIL FLEX 10 FLL-RA (TUBING) ×2 IMPLANT

## 2018-11-19 NOTE — Progress Notes (Signed)
TCTS consulted for CABG evaluation. °

## 2018-11-19 NOTE — ED Notes (Signed)
Dr. Regenia Skeeter spoke with pt's daughter via telephone and updated on pt's condition

## 2018-11-19 NOTE — Progress Notes (Signed)
ANTICOAGULATION CONSULT NOTE - Initial Consult  Pharmacy Consult for heparin Indication: chest pain/ACS  Allergies  Allergen Reactions  . Penicillins     Patient Measurements: Height: 5\' 9"  (175.3 cm) Weight: 170 lb (77.1 kg) IBW/kg (Calculated) : 70.7 Heparin Dosing Weight: 77.1kg  Vital Signs: Temp: 97.6 F (36.4 C) (04/21 1300) Temp Source: Oral (04/21 1300) BP: 120/98 (04/21 1300) Pulse Rate: 105 (04/21 1300)  Labs: Recent Labs    11/19/18 1302  HGB 14.9  HCT 44.5  PLT 126*  CREATININE 0.95  TROPONINI 0.30*    Estimated Creatinine Clearance: 67.2 mL/min (by C-G formula based on SCr of 0.95 mg/dL).   Medical History: Past Medical History:  Diagnosis Date  . Diabetes mellitus without complication ([place])    type 2  . Hypertension     Medications:  Infusions:  . heparin      Assessment: 45 yom presented to the ED with CP. Troponin elevated and now starting IV heparin. Baseline Hgb is WNL but platelets are slightly low. He is not on anticoagulation PTA.   Goal of Therapy:  Heparin level 0.3-0.7 units/ml Monitor platelets by anticoagulation protocol: Yes   Plan:  Heparin bolus 4000 units IV x 1 Heparin gtt 950 units/hr Check an 8 hr heparin level Daily heparin level and CBC  Othello Sgroi, Rande Lawman 11/19/2018,3:00 PM

## 2018-11-19 NOTE — Progress Notes (Signed)
[facility] BAND REMOVAL  LOCATION:    right radial  DEFLATED PER PROTOCOL:    Yes.    TIME BAND OFF / DRESSING APPLIED:    20:30   SITE UPON ARRIVAL:    Level 0  SITE AFTER BAND REMOVAL:    Level 0  CIRCULATION SENSATION AND MOVEMENT:    Within Normal Limits   Yes.    COMMENTS:   Post TR band instructions given. Pt tolerated well. 

## 2018-11-19 NOTE — Progress Notes (Signed)
ANTICOAGULATION CONSULT NOTE - [place] for heparin Indication: chest pain/ACS  Allergies  Allergen Reactions  . Penicillins Itching    Patient Measurements: Height: 5\' 9"  (175.3 cm) Weight: 170 lb (77.1 kg) IBW/kg (Calculated) : 70.7 Heparin Dosing Weight: 77.1kg  Vital Signs: Temp: 97.6 F (36.4 C) (04/21 1300) Temp Source: Oral (04/21 1300) BP: 149/83 (04/21 1500) Pulse Rate: 74 (04/21 1500)  Labs: Recent Labs    11/19/18 1302  HGB 14.9  HCT 44.5  PLT 126*  CREATININE 0.95  TROPONINI 0.30*    Estimated Creatinine Clearance: 67.2 mL/min (by C-G formula based on SCr of 0.95 mg/dL).   Medical History: Past Medical History:  Diagnosis Date  . Diabetes mellitus without complication ([place])    type 2  . Hypertension     Medications:  Infusions:  . sodium chloride    . sodium chloride    . nitroGLYCERIN      Assessment: Keith Lucero presented to the ED with CP. Troponin elevated and now starting IV heparin. Baseline Hgb is WNL but platelets are slightly low. He is not on anticoagulation PTA.   Angiography revealed significant multivessel CAD. Pharmacy asked to resume heparin 8hr after sheath pull.  Goal of Therapy:  Heparin level 0.3-0.7 units/ml Monitor platelets by anticoagulation protocol: Yes   Plan:  -Restart 950 units/hr with no bolus at 0100 -Check 8hr heparin level -Follow-up plans for surgical revascularization  Arrie Senate, PharmD, BCPS Clinical Pharmacist [phone] Please check AMION for all [facility]acy numbers 11/19/2018

## 2018-11-19 NOTE — ED Triage Notes (Signed)
Pt began having left CP that radiates to left arm x 1 week, bp was 268/185, took a nitro with some relief. Took bp med this morning.

## 2018-11-19 NOTE — ED Provider Notes (Signed)
[facility] EMERGENCY DEPARTMENT Provider Note   CSN: [id number] Arrival date & time: 11/19/18  1244    History   Chief Complaint Chief Complaint  Patient presents with   Chest Pain    HPI Keith Lucero. is a 73 y.o. male.     HPI  73 year old male presents for evaluation of chest pain.  Has been having on and off chest pain for about a week.  He states that it has been intermittent.  Is in the middle of his chest and sometimes feels like his heart is beating hard and sometimes the heaviness.  He is also had pain running down his left arm with some numbness.  Chronic back pain from prior injury but no new back pain or radiation of the pain to his back.  He also gets short of breath.  Mostly this is been with exertion though is now getting worse.  He took his blood pressure this morning was 268/185 and he took 1 nitroglycerin which relieved his chest pain.he has prior coronary disease and has 2 stents, one placed about 20 years ago and one 8.  These were done in [facility].  Currently no chest pain.  Past Medical History:  Diagnosis Date   Diabetes mellitus without complication ([place])    type 2   Hypertension     There are no active problems to display for this patient.   Past Surgical History:  Procedure Laterality Date   CORONARY ANGIOPLASTY WITH STENT PLACEMENT  1998        Home Medications    Prior to Admission medications   Not on File    Family History History reviewed. No pertinent family history.  Social History Social History   Tobacco Use   Smoking status: Never Smoker   Smokeless tobacco: Never Used  Substance Use Topics   Alcohol use: Not Currently   Drug use: Never     Allergies   Penicillins   Review of Systems Review of Systems  Constitutional: Negative for fever.  Respiratory: Positive for shortness of breath. Negative for cough.   Cardiovascular: Positive for chest pain.  Gastrointestinal: Negative  for abdominal pain.  Musculoskeletal: Negative for back pain.  All other systems reviewed and are negative.    Physical Exam Updated Vital Signs BP (!) 149/83    Pulse 74    Temp 97.6 F (36.4 C) (Oral)    Resp 20    Ht 5\' 9"  (1.753 m)    Wt 77.1 kg    SpO2 99%    BMI 25.10 kg/m   Physical Exam Vitals signs and nursing note reviewed.  Constitutional:      General: He is not in acute distress.    Appearance: He is well-developed. He is not ill-appearing or diaphoretic.  HENT:     Head: Normocephalic and atraumatic.     Right Ear: External ear normal.     Left Ear: External ear normal.     Nose: Nose normal.  Eyes:     General:        Right eye: No discharge.        Left eye: No discharge.  Neck:     Musculoskeletal: Neck supple.  Cardiovascular:     Rate and Rhythm: Normal rate and regular rhythm.     Pulses:          Radial pulses are 2+ on the right side and 2+ on the left side.  Heart sounds: Normal heart sounds.  Pulmonary:     Effort: Pulmonary effort is normal.     Breath sounds: Normal breath sounds.  Abdominal:     Palpations: Abdomen is soft.     Tenderness: There is no abdominal tenderness.  Skin:    General: Skin is warm and dry.  Neurological:     Mental Status: He is alert.  Psychiatric:        Mood and Affect: Mood is not anxious.      ED Treatments / Results  Labs (all labs ordered are listed, but only abnormal results are displayed) Labs Reviewed  BASIC METABOLIC PANEL - Abnormal; Notable for the following components:      Result Value   CO2 19 (*)    Glucose, Bld 263 (*)    Calcium 8.5 (*)    All other components within normal limits  CBC - Abnormal; Notable for the following components:   Platelets 126 (*)    All other components within normal limits  TROPONIN I - Abnormal; Notable for the following components:   Troponin I 0.30 (*)    All other components within normal limits  HEPARIN LEVEL (UNFRACTIONATED)    EKG EKG  Interpretation  Date/Time:  Tuesday November 19 2018 12:50:51 EDT Ventricular Rate:  95 PR Interval:  156 QRS Duration: 90 QT Interval:  430 QTC Calculation: 540 R Axis:   -67 Text Interpretation:  Normal sinus rhythm Left anterior fascicular block Anterior infarct , age undetermined T wave abnormality, consider lateral ischemia Prolonged QT Abnormal ECG No old tracing to compare Confirmed by Sherwood Gambler [phone]) on 11/19/2018 2:09:53 PM Also confirmed by Sherwood Gambler [phone]), editor Philomena Doheny [phone])  on 11/19/2018 3:24:22 PM   Radiology Dg Chest 2 View  Result Date: 11/19/2018 CLINICAL DATA:  Acute LEFT chest pain for 1 week. EXAM: CHEST - 2 VIEW COMPARISON:  09/11/2008 and prior chest FINDINGS: Cardiomegaly and mild peribronchial thickening again noted. There is no evidence of focal airspace disease, pulmonary edema, suspicious pulmonary nodule/mass, pleural effusion, or pneumothorax. No acute bony abnormalities are identified. IMPRESSION: Cardiomegaly without evidence of acute cardiopulmonary disease. Electronically Signed   By: Margarette Canada M.D.   On: 11/19/2018 13:19    Procedures .Critical Care Performed by: Sherwood Gambler, MD Authorized by: Sherwood Gambler, MD   Critical care provider statement:    Critical care time (minutes):  35   Critical care time was exclusive of:  Separately billable procedures and treating other patients   Critical care was necessary to treat or prevent imminent or life-threatening deterioration of the following conditions:  Circulatory failure and cardiac failure   Critical care was time spent personally by me on the following activities:  Discussions with consultants, evaluation of patient's response to treatment, examination of patient, ordering and performing treatments and interventions, ordering and review of laboratory studies, ordering and review of radiographic studies, pulse oximetry, re-evaluation of patient's condition, obtaining history  from patient or surrogate and review of old charts   (including critical care time)  Medications Ordered in ED Medications  sodium chloride flush (NS) 0.9 % injection 3 mL (0 mLs Intravenous Hold 11/19/18 1456)  nitroGLYCERIN (NITROSTAT) SL tablet 0.4 mg (has no administration in time range)  heparin ADULT infusion 100 units/mL (25000 units/227mL sodium chloride 0.45%) (950 Units/hr Intravenous New Bag/Given 11/19/18 1506)  aspirin chewable tablet 324 mg (324 mg Oral Given 11/19/18 1456)  heparin bolus via infusion 4,000 Units (4,000 Units Intravenous Bolus from Bag  11/19/18 1508)     Initial Impression / Assessment and Plan / ED Course  I have reviewed the triage vital signs and the nursing notes.  Pertinent labs & imaging results that were available during my care of the patient were reviewed by me and considered in my medical decision making (see chart for details).        Patient presents with intermittent chest pain, worse this morning.  The patient has nonspecific T wave seen though no old to compare to in our system.  In combination with a story, this is concerning for ACS.  His initial troponin is 0.30.  He reports a severe elevated blood pressure at home though his blood pressure is okay here.  Currently he denies any chest pain.  He will be placed on heparin, given aspirin, and have PRN nitro.  I have consulted cardiology for admission.  Final Clinical Impressions(s) / ED Diagnoses   Final diagnoses:  NSTEMI (non-ST elevated myocardial infarction) [facility])    ED Discharge Orders    None       Sherwood Gambler, MD 11/19/18 1531

## 2018-11-19 NOTE — H&P (Addendum)
Cardiology Admission History and Physical:   Patient ID: Keith Lucero. MRN: [id number]; DOB: 04-Nov-1945   Admission date: 11/19/2018  Primary Care Provider: No primary care provider on file. Primary Cardiologist: No primary care provider on file.  Primary Electrophysiologist:  None   Chief Complaint:  Chest Pain  Patient Profile:   Keith Rosko. is a 73 y.o. male with CAD, hypertension, and type 2 diabetes mellitus, who presents to the [facility] ED for evaluation of chest pain.  History of Present Illness:   Keith Lucero is a 73 year old male with a history of CAD, hypertension, and type 2 diabetes mellitus who is followed at [facility].   Patient presents to the ED for evaluation of chest pain.  Patient reports substernal left sided chest pain with radiation to left arm over the last 2-3 days. Patient describes it as a "heavy pain" that has been consistently worse with exertion over the last few day. This morning patient had significant 10/10 left arm pain while walking. He sat down and checked his BP which was significantly elevated in the 260s/180s. He took a sublingual  Nitroglycerin with almost complete resolution of his pain and after a few minutes his blood pressure improved some but was still elevated. Patient reports feeling weak and nauseous over the past few days with a decreased appetite but denies any fever, chills, body aches, vomiting, or respiratory symptoms. He reports some palpitations with the pain this morning and some dizziness with standing. No orthopnea, PND, or edema.   Upon arrival to the ED, vitals stable with BP of 120/98. EKG showed normal sinus rhythm with T wave inversions in the anterolateral leads (no prior tracings available for comparison). Initial troponin elevated at 0.30. Chest x-ray showed cardiomegaly but no acute findings. WBC 7.1, Hgb 14.9, Plts 126. Na 141, K 3.7, Glucose 263, SCr 0.95.   Patient reports chewing tobacco since the  age of 56 but denies any other tobacco use. No alcohol or illicit drug use. No known family history of heart disease.   Of note, patient cannot read and can only write his name. He states he only completed the 3rd grade because he had to start working to help provide for his family.  Past Medical History:  Diagnosis Date  . Diabetes mellitus without complication ([place])    type 2  . Hypertension     Past Surgical History:  Procedure Laterality Date  . CORONARY ANGIOPLASTY WITH STENT PLACEMENT  1998     Medications Prior to Admission: Prior to Admission medications   Not on File     Allergies:    Allergies  Allergen Reactions  . Penicillins     Social History:   Social History   Socioeconomic History  . Marital status: Married    Spouse name: Not on file  . Number of children: Not on file  . Years of education: Not on file  . Highest education level: Not on file  Occupational History  . Not on file  Social Needs  . Financial resource strain: Not on file  . Food insecurity:    Worry: Not on file    Inability: Not on file  . Transportation needs:    Medical: Not on file    Non-medical: Not on file  Tobacco Use  . Smoking status: Never Smoker  . Smokeless tobacco: Never Used  Substance and Sexual Activity  . Alcohol use: Not Currently  . Drug use: Never  .  Sexual activity: Not on file  Lifestyle  . Physical activity:    Days per week: Not on file    Minutes per session: Not on file  . Stress: Not on file  Relationships  . Social connections:    Talks on phone: Not on file    Gets together: Not on file    Attends religious service: Not on file    Active member of club or organization: Not on file    Attends meetings of clubs or organizations: Not on file    Relationship status: Not on file  . Intimate partner violence:    Fear of current or ex partner: Not on file    Emotionally abused: Not on file    Physically abused: Not on file    Forced sexual  activity: Not on file  Other Topics Concern  . Not on file  Social History Narrative  . Not on file    Family History:   The patient's family history is not on file.    ROS:  Please see the history of present illness.  Review of Systems  Constitutional: Negative for chills and fever. Diaphoresis: sweaty.  HENT: Negative for congestion, nosebleeds and sore throat.   Respiratory: Negative for cough, hemoptysis and shortness of breath.   Cardiovascular: Positive for chest pain and palpitations. Negative for orthopnea, leg swelling and PND.  Gastrointestinal: Positive for nausea. Negative for blood in stool and vomiting.  Genitourinary: Negative for hematuria.  Musculoskeletal: Negative for myalgias.  Neurological: Positive for dizziness. Negative for tingling and loss of consciousness.  Endo/Heme/Allergies: Does not bruise/bleed easily.  Psychiatric/Behavioral: Positive for memory loss and substance abuse (chews tobacco).     Physical Exam/Data:   Vitals:   11/19/18 1300 11/19/18 1400  BP: (!) 120/98   Pulse: (!) 105   Resp: 16   Temp: 97.6 F (36.4 C)   TempSrc: Oral   SpO2: 99%   Weight:  77.1 kg  Height:  5\' 9"  (1.753 m)   No intake or output data in the 24 hours ending 11/19/18 1455 Last 3 Weights 11/19/2018  Weight (lbs) 170 lb  Weight (kg) 77.111 kg   Physical Exam per MD:  Body mass index is 25.1 kg/m.  General:  Well nourished, well developed, in no acute distress. HEENT: Normal Neck: Supple. No JVD. Vascular: No carotid bruits. FA pulses 2+ bilaterally without bruits  Cardiac: RRR. Distinct S1 and S2. No significant murmurs, gallops, or rubs.  Lungs: No increased work of breathing. Clear to auscultation bilaterally. No wheezing, rhonchi or rales.  Abd: Soft, non-distended, and non-tender to palpation. Umbilical hernia noted. Bowel sounds present. Ext: No edema. Musculoskeletal:  No deformities, BUE and BLE strength normal and equal Skin: warm and dry   Neuro:  Alert and oriented x3. No focal deficits. Psych:  Normal affect. Responds appropriately.   EKG:  The ECG that was done was personally reviewed and demonstrates T wave inversion in anterolateral leads.  Telemetry: Telemetry was personally reviewed and demonstrates sinus rhythm.  Relevant CV Studies: None  Laboratory Data:  Chemistry Recent Labs  Lab 11/19/18 1302  NA 141  K 3.7  CL 110  CO2 19*  GLUCOSE 263*  BUN 18  CREATININE 0.95  CALCIUM 8.5*  GFRNONAA >60  GFRAA >60  ANIONGAP 12    No results for input(s): PROT, ALBUMIN, AST, ALT, ALKPHOS, BILITOT in the last 168 hours. Hematology Recent Labs  Lab 11/19/18 1302  WBC 7.1  RBC 5.01  HGB 14.9  HCT 44.5  MCV 88.8  MCH 29.7  MCHC 33.5  RDW 12.3  PLT 126*   Cardiac Enzymes Recent Labs  Lab 11/19/18 1302  TROPONINI 0.30*   No results for input(s): TROPIPOC in the last 168 hours.  BNPNo results for input(s): BNP, PROBNP in the last 168 hours.  DDimer No results for input(s): DDIMER in the last 168 hours.  Radiology/Studies:  Dg Chest 2 View  Result Date: 11/19/2018 CLINICAL DATA:  Acute LEFT chest pain for 1 week. EXAM: CHEST - 2 VIEW COMPARISON:  09/11/2008 and prior chest FINDINGS: Cardiomegaly and mild peribronchial thickening again noted. There is no evidence of focal airspace disease, pulmonary edema, suspicious pulmonary nodule/mass, pleural effusion, or pneumothorax. No acute bony abnormalities are identified. IMPRESSION: Cardiomegaly without evidence of acute cardiopulmonary disease. Electronically Signed   By: Margarette Canada M.D.   On: 11/19/2018 13:19    Assessment and Plan:   NSTEMI - Patient presents with exertional chest pain that radiates to left arm for the past 2-3 days. Patient has history of CAD s/p 2 stents several years ago. - EKG shows T wave inversions in anterolateral leads (no prior tracings for comparison). - Initial troponin 0.30. - Patient currently chest pain free.  -  Will check lipid panel and hemoglobin A1c. - IV Heparin started in the ED. - Continue Aspirin 81mg  daily. Patient not on a statin or beta-blocker. Will start both. - Plan is for cardiac catheterization today. The patient understands that risks include but are not limited to stroke (1 in 1000), death (1 in 53), kidney failure [usually temporary] (1 in 500), bleeding (1 in 200), allergic reaction [possibly serious] (1 in 200), and agrees to proceed.   Hypertension - BP elevated at 149/83. Patient reports it was as high as the 260s/180s earlier today. - Continue home Amlodipine 10mg  daily and Lisinopril 40mg  daily. - Will add Coreg 6.25mg  twice daily.  Type 2 Diabetes Mellitus - Patient on Metformin 750mg  daily at home. Last dose was last night. Will hold given cardiac catheterization. Can restart 48 hours after cath. - Will check hemoglobin A1c. - Will start sliding scale insulin.   Severity of Illness: The appropriate patient status for this patient is INPATIENT. Inpatient status is judged to be reasonable and necessary in order to provide the required intensity of service to ensure the patient's safety. The patient's presenting symptoms, physical exam findings, and initial radiographic and laboratory data in the context of their chronic comorbidities is felt to place them at high risk for further clinical deterioration. Furthermore, it is not anticipated that the patient will be medically stable for discharge from the hospital within 2 midnights of admission. The following factors support the patient status of inpatient.   " The patient's presenting symptoms include chest pain. " The worrisome physical exam findings as above. " The initial radiographic and laboratory data are worrisome because of abnormal EKG and elevated troponin. " The chronic co-morbidities include hypertension and diabetes.   * I certify that at the point of admission it is my clinical judgment that the patient will  require inpatient hospital care spanning beyond 2 midnights from the point of admission due to high intensity of service, high risk for further deterioration and high frequency of surveillance required.*    For questions or updates, please contact [place] Please consult [email or web address] for contact info under        Signed, Darreld Mclean, PA-C  11/19/2018 2:55 PM  Patient seen and examined and history reviewed. Agree with above findings and plan. Very pleasant 73  Yo WM with known history of CAD s/p PCI  With stent x 2 > 10 years ago. History of HTN and DM. On no lipid treatment. Reports severe episode of chest pain on Saturday lasting several hours. Did not take Ntg or seek attention. This am pain recurred and BP was severely elevated. Sought medical attention. Currently pain free.  On exam he is hypertensive. No distress No JVD or bruits. Lungs are clear.  CV no gallop, murmur or rub. Abd with umbilical hernia otherwise normal Pulses 2+ no edema Neuro intact  Ecg shows NSR with LAFB, marked ST-T wave changes in anterolateral leads. I have personally reviewed and interpreted this study.  Impression NSTEMI. Known history of CAD. Cardiac risk factors of DM, HTN, untreated lipids.  Plan; admit IV heparin. Add Coreg for beta blocker. Continue ACEi and amlodipine. Start  High dose statin Recommend proceeding with cardiac cath today with possible PCI. The procedure and risks were reviewed including but not limited to death, myocardial infarction, stroke, arrythmias, bleeding, transfusion, emergency surgery, dye allergy, or renal dysfunction. The patient voices understanding and is agreeable to proceed.  Sahiti Joswick [place], [place] 11/19/2018 4:08 PM

## 2018-11-19 NOTE — Progress Notes (Signed)
  Echocardiogram 2D Echocardiogram has been performed.  Keith Lucero 11/19/2018, 5:56 PM

## 2018-11-20 ENCOUNTER — Encounter (HOSPITAL_COMMUNITY): Payer: Self-pay | Admitting: Cardiovascular Disease

## 2018-11-20 ENCOUNTER — Inpatient Hospital Stay (HOSPITAL_COMMUNITY): Payer: PPO

## 2018-11-20 DIAGNOSIS — I25110 Atherosclerotic heart disease of native coronary artery with unstable angina pectoris: Secondary | ICD-10-CM

## 2018-11-20 DIAGNOSIS — E119 Type 2 diabetes mellitus without complications: Secondary | ICD-10-CM

## 2018-11-20 DIAGNOSIS — Z01810 Encounter for preprocedural cardiovascular examination: Secondary | ICD-10-CM

## 2018-11-20 DIAGNOSIS — I10 Essential (primary) hypertension: Secondary | ICD-10-CM

## 2018-11-20 LAB — CBC
HCT: 40.2 % (ref 39.0–52.0)
Hemoglobin: 13.8 g/dL (ref 13.0–17.0)
MCH: 29.6 pg (ref 26.0–34.0)
MCHC: 34.3 g/dL (ref 30.0–36.0)
MCV: 86.3 fL (ref 80.0–100.0)
Platelets: 112 10*3/uL — ABNORMAL LOW (ref 150–400)
RBC: 4.66 MIL/uL (ref 4.22–5.81)
RDW: 12.3 % (ref 11.5–15.5)
WBC: 6.6 10*3/uL (ref 4.0–10.5)
nRBC: 0 % (ref 0.0–0.2)

## 2018-11-20 LAB — URINALYSIS, ROUTINE W REFLEX MICROSCOPIC
Bacteria, UA: NONE SEEN
Bilirubin Urine: NEGATIVE
Glucose, UA: NEGATIVE mg/dL
Ketones, ur: NEGATIVE mg/dL
Nitrite: NEGATIVE
Protein, ur: 30 mg/dL — AB
Specific Gravity, Urine: 1.018 (ref 1.005–1.030)
pH: 5 (ref 5.0–8.0)

## 2018-11-20 LAB — BASIC METABOLIC PANEL
Anion gap: 9 (ref 5–15)
BUN: 13 mg/dL (ref 8–23)
CO2: 20 mmol/L — ABNORMAL LOW (ref 22–32)
Calcium: 8.5 mg/dL — ABNORMAL LOW (ref 8.9–10.3)
Chloride: 110 mmol/L (ref 98–111)
Creatinine, Ser: 0.82 mg/dL (ref 0.61–1.24)
GFR calc Af Amer: >60 mL/min (ref >60)
GFR calc non Af Amer: >60 mL/min (ref >60)
Glucose, Bld: 156 mg/dL — ABNORMAL HIGH (ref 70–99)
Potassium: 3.9 mmol/L (ref 3.5–5.1)
Sodium: 139 mmol/L (ref 135–145)

## 2018-11-20 LAB — HEMOGLOBIN A1C
Hgb A1c MFr Bld: 6.2 % — ABNORMAL HIGH (ref 4.8–5.6)
Mean Plasma Glucose: 131.24 mg/dL

## 2018-11-20 LAB — HEPARIN LEVEL (UNFRACTIONATED): Heparin Unfractionated: 0.66 IU/mL (ref 0.30–0.70)

## 2018-11-20 LAB — SURGICAL PCR SCREEN
MRSA, PCR: NEGATIVE
Staphylococcus aureus: NEGATIVE

## 2018-11-20 LAB — LIPID PANEL
Cholesterol: 117 mg/dL (ref 0–200)
HDL: 23 mg/dL — ABNORMAL LOW (ref >40)
LDL Cholesterol: 60 mg/dL (ref 0–99)
Total CHOL/HDL Ratio: 5.1 RATIO
Triglycerides: 171 mg/dL — ABNORMAL HIGH (ref <150)
VLDL: 34 mg/dL (ref 0–40)

## 2018-11-20 LAB — TYPE AND SCREEN
ABO/RH(D): O POS
Antibody Screen: NEGATIVE

## 2018-11-20 LAB — GLUCOSE, CAPILLARY
Glucose-Capillary: 144 mg/dL — ABNORMAL HIGH (ref 70–99)
Glucose-Capillary: 167 mg/dL — ABNORMAL HIGH (ref 70–99)
Glucose-Capillary: 114 mg/dL — ABNORMAL HIGH (ref 70–99)

## 2018-11-20 LAB — PROTIME-INR
INR: 1.2 (ref 0.8–1.2)
Prothrombin Time: 15.5 seconds — ABNORMAL HIGH (ref 11.4–15.2)

## 2018-11-20 LAB — APTT: aPTT: 46 seconds — ABNORMAL HIGH (ref 24–36)

## 2018-11-20 MED ORDER — PHENYLEPHRINE HCL-NACL 20-0.9 MG/250ML-% IV SOLN
30.0000 ug/min | INTRAVENOUS | Status: DC
  Administered 2018-11-21: 09:00:00 25 ug/min via INTRAVENOUS
  Filled 2018-11-20: qty 250

## 2018-11-20 MED ORDER — NOREPINEPHRINE 4 MG/250ML-% IV SOLN
0.0000 ug/min | INTRAVENOUS | Status: DC
  Filled 2018-11-20: qty 250

## 2018-11-20 MED ORDER — DOPAMINE-DEXTROSE 3.2-5 MG/ML-% IV SOLN
0.0000 ug/kg/min | INTRAVENOUS | Status: DC
  Filled 2018-11-20: qty 250

## 2018-11-20 MED ORDER — TRANEXAMIC ACID (OHS) BOLUS VIA INFUSION
15.0000 mg/kg | INTRAVENOUS | Status: DC
  Filled 2018-11-20: qty 1164

## 2018-11-20 MED ORDER — TRANEXAMIC ACID (OHS) PUMP PRIME SOLUTION
2.0000 mg/kg | INTRAVENOUS | Status: DC
  Filled 2018-11-20: qty 1.55

## 2018-11-20 MED ORDER — ALUM & MAG HYDROXIDE-SIMETH 200-200-20 MG/5ML PO SUSP
30.0000 mL | ORAL | Status: DC | PRN
  Administered 2018-11-20: 23:00:00 30 mL via ORAL
  Filled 2018-11-20: qty 30

## 2018-11-20 MED ORDER — CHLORHEXIDINE GLUCONATE CLOTH 2 % EX PADS: 6.0000 | MEDICATED_PAD | Freq: Once | CUTANEOUS | Status: DC

## 2018-11-20 MED ORDER — SODIUM CHLORIDE 0.9 % IV SOLN
INTRAVENOUS | Status: DC
  Filled 2018-11-20: qty 30

## 2018-11-20 MED ORDER — LEVOFLOXACIN IN D5W 500 MG/100ML IV SOLN
500.0000 mg | INTRAVENOUS | Status: DC
  Administered 2018-11-21: 500 mg via INTRAVENOUS
  Filled 2018-11-20: qty 100

## 2018-11-20 MED ORDER — CHLORHEXIDINE GLUCONATE CLOTH 2 % EX PADS
6.0000 | MEDICATED_PAD | Freq: Once | CUTANEOUS | Status: AC
  Administered 2018-11-20: 6 via TOPICAL

## 2018-11-20 MED ORDER — INSULIN REGULAR(HUMAN) IN NACL 100-0.9 UT/100ML-% IV SOLN
INTRAVENOUS | Status: DC
  Administered 2018-11-21: 1.5 [IU]/h via INTRAVENOUS
  Filled 2018-11-20: qty 100

## 2018-11-20 MED ORDER — MAGNESIUM SULFATE 50 % IJ SOLN
40.0000 meq | INTRAMUSCULAR | Status: DC
  Filled 2018-11-20: qty 9.85

## 2018-11-20 MED ORDER — EPINEPHRINE PF 1 MG/ML IJ SOLN
0.0000 ug/min | INTRAVENOUS | Status: DC
  Filled 2018-11-20: qty 4

## 2018-11-20 MED ORDER — PLASMA-LYTE 148 IV SOLN
INTRAVENOUS | Status: DC
  Filled 2018-11-20: qty 2.5

## 2018-11-20 MED ORDER — DEXMEDETOMIDINE HCL IN NACL 400 MCG/100ML IV SOLN
0.1000 ug/kg/h | INTRAVENOUS | Status: DC
  Administered 2018-11-21: 08:00:00 .2 ug/kg/h via INTRAVENOUS
  Filled 2018-11-20: qty 100

## 2018-11-20 MED ORDER — CHLORHEXIDINE GLUCONATE 0.12 % MT SOLN
15.0000 mL | Freq: Once | OROMUCOSAL | Status: AC
  Administered 2018-11-21: 15 mL via OROMUCOSAL
  Filled 2018-11-20: qty 15

## 2018-11-20 MED ORDER — BISACODYL 5 MG PO TBEC: 5.0000 mg | DELAYED_RELEASE_TABLET | Freq: Once | ORAL | Status: DC

## 2018-11-20 MED ORDER — POTASSIUM CHLORIDE 2 MEQ/ML IV SOLN
80.0000 meq | INTRAVENOUS | Status: DC
  Filled 2018-11-20: qty 40

## 2018-11-20 MED ORDER — MILRINONE LACTATE IN DEXTROSE 20-5 MG/100ML-% IV SOLN
0.3000 ug/kg/min | INTRAVENOUS | Status: DC
  Filled 2018-11-20: qty 100

## 2018-11-20 MED ORDER — NITROGLYCERIN IN D5W 200-5 MCG/ML-% IV SOLN
2.0000 ug/min | INTRAVENOUS | Status: DC
  Filled 2018-11-20: qty 250

## 2018-11-20 MED ORDER — TRANEXAMIC ACID 1000 MG/10ML IV SOLN
1.5000 mg/kg/h | INTRAVENOUS | Status: DC
  Administered 2018-11-21: 15 mg/kg/h via INTRAVENOUS
  Filled 2018-11-20: qty 25

## 2018-11-20 MED ORDER — CHLORHEXIDINE GLUCONATE 4 % EX LIQD
Freq: Once | CUTANEOUS | Status: AC
  Administered 2018-11-20: 22:00:00 via TOPICAL
  Filled 2018-11-20: qty 120

## 2018-11-20 MED ORDER — METOPROLOL TARTRATE 12.5 MG HALF TABLET
12.5000 mg | ORAL_TABLET | Freq: Once | ORAL | Status: AC
  Administered 2018-11-21: 12.5 mg via ORAL
  Filled 2018-11-20: qty 1

## 2018-11-20 MED ORDER — DIAZEPAM 2 MG PO TABS
2.0000 mg | ORAL_TABLET | Freq: Once | ORAL | Status: AC
  Administered 2018-11-21: 2 mg via ORAL
  Filled 2018-11-20: qty 1

## 2018-11-20 MED ORDER — TEMAZEPAM 7.5 MG PO CAPS: 15.0000 mg | ORAL_CAPSULE | Freq: Once | ORAL | Status: DC | PRN

## 2018-11-20 MED ORDER — VANCOMYCIN HCL 10 G IV SOLR
1250.0000 mg | INTRAVENOUS | Status: DC
  Administered 2018-11-21: 1250 mg via INTRAVENOUS
  Filled 2018-11-20: qty 1250

## 2018-11-20 MED FILL — Heparin Sod (Porcine)-NaCl IV Soln 1000 Unit/500ML-0.9%: INTRAVENOUS | Qty: 1000 | Status: AC

## 2018-11-20 NOTE — Progress Notes (Signed)
Cardiac Rehab Advisory Cardiac Rehab Phase I is not seeing pts face to face at this time due to Covid 19 restrictions. Ambulation is occurring through nursing, PT, and mobility teams. We will help facilitate that process as needed. We are calling pts in their rooms and discussing education. We will then deliver education materials to pts RN for delivery to pt.   [phone] Discussed with pt the importance of walking and IS after surgery. RN will need to give IS and instruct pt in use. Discussed with pt staying in the tube and adhering to sternal precautions. Encouraged pt to watch pre op video as chart states does not read well. Will give [facility] booklet, care guide and in the tube handout for pt to take home with him. He stated that his daughter and grandson will be available after discharge to help with care. Graylon Good RN BSN 11/20/2018 10:02 AM

## 2018-11-20 NOTE — Progress Notes (Addendum)
ANTICOAGULATION CONSULT NOTE - Outlook for heparin Indication: chest pain/ACS  Allergies  Allergen Reactions  . Penicillins Itching    Patient Measurements: Height: 5\' 9"  (175.3 cm) Weight: 171 lb 1.2 oz (77.6 kg) IBW/kg (Calculated) : 70.7 Heparin Dosing Weight: 77.1kg  Vital Signs: Temp: 97.8 F (36.6 C) (04/22 0601) Temp Source: Oral (04/22 0601) BP: 127/75 (04/22 0601) Pulse Rate: 82 (04/22 0601)  Labs: Recent Labs    11/19/18 1302 11/20/18 0414 11/20/18 0845  HGB 14.9 13.8  --   HCT 44.5 40.2  --   PLT 126* 112*  --   HEPARINUNFRC  --   --  0.66  CREATININE 0.95 0.82  --   TROPONINI 0.30*  --   --     Estimated Creatinine Clearance: 77.8 mL/min (by C-G formula based on SCr of 0.82 mg/dL).   Medical History: Past Medical History:  Diagnosis Date  . Diabetes mellitus without complication ([place])    type 2  . Hypertension     Medications:  Infusions:  . sodium chloride 70 mL/hr at 11/20/18 0210  . sodium chloride    . heparin 950 Units/hr (11/20/18 0600)  . nitroGLYCERIN 10 mcg/min (11/19/18 2200)    Assessment: 14 yom presented to the ED with CP. Troponin elevated and now starting IV heparin. Baseline Hgb is WNL but platelets are slightly low. He is not on anticoagulation PTA.   Angiography revealed significant multivessel CAD. Pharmacy asked to resume heparin 8hr after sheath pull. Heparin level today came back at 0.66, on 950 units/hr. Hgb 13.8, plt 112. No s/sx of bleeding. No infusion issues.   Goal of Therapy:  Heparin level 0.3-0.7 units/ml Monitor platelets by anticoagulation protocol: Yes   Plan:  -Reduce slightly to 900 units/hr since on higher end of goal range  -Plan for CABG on 4/23 AM -Monitor daily HL, CBC, and for s/sx of bleeding  Antonietta Jewel, PharmD, [place] Clinical Pharmacist  Pager: [phone] Phone: [phone] Please check AMION for all [facility] numbers 11/20/2018

## 2018-11-20 NOTE — Progress Notes (Signed)
Pre cabg evaluation       has been completed. Preliminary results can be found under CV proc through chart review. June Leap, BS, RDMS, RVT

## 2018-11-20 NOTE — Progress Notes (Addendum)
Progress Note  Patient Name: Keith Lucero. Date of Encounter: 11/20/2018  Primary Cardiologist: Ovie Cornelio Martinique, MD   Subjective   Feels well this am. No recurrent chest pain. Breathing ok.  Inpatient Medications    Scheduled Meds:  amLODipine  10 mg Oral Daily   aspirin EC  81 mg Oral Daily   atorvastatin  80 mg Oral q1800   carvedilol  6.25 mg Oral BID WC   ferrous sulfate  325 mg Oral QHS   insulin aspart  0-15 Units Subcutaneous TID WC   lisinopril  40 mg Oral Daily   pantoprazole  40 mg Oral Daily   sodium chloride flush  3 mL Intravenous Once   sodium chloride flush  3 mL Intravenous Q12H   Continuous Infusions:  sodium chloride 70 mL/hr at 11/20/18 0210   sodium chloride     heparin 950 Units/hr (11/20/18 0600)   nitroGLYCERIN 10 mcg/min (11/19/18 2200)   PRN Meds: sodium chloride, acetaminophen, diazepam, diphenoxylate-atropine, nitroGLYCERIN, ondansetron (ZOFRAN) IV, sodium chloride flush, zolpidem   Vital Signs    Vitals:   11/19/18 1828 11/19/18 1900 11/19/18 2000 11/20/18 0601  BP: 120/81 116/76 120/71 127/75  Pulse: 86 71 70 82  Resp:   18 19  Temp:   97.7 F (36.5 C) 97.8 F (36.6 C)  TempSrc:   Oral Oral  SpO2:  99% 97% 99%  Weight:    77.6 kg  Height:        Intake/Output Summary (Last 24 hours) at 11/20/2018 0910 Last data filed at 11/20/2018 0600 Gross per 24 hour  Intake 452.4 ml  Output 400 ml  Net 52.4 ml   Last 3 Weights 11/20/2018 11/19/2018  Weight (lbs) 171 lb 1.2 oz 170 lb  Weight (kg) 77.6 kg 77.111 kg      Telemetry    NSR with rare PVC - Personally Reviewed  ECG    NSR with LAFB, deep T wave inversion in anterolateral leads - Personally Reviewed  Physical Exam   GEN: No acute distress.   Neck: No JVD Cardiac: RRR, no murmurs, rubs, or gallops. Radial cath site without hematoma. Respiratory: Clear to auscultation bilaterally. GI: Soft, nontender, non-distended  MS: No edema; No  deformity. Neuro:  Nonfocal  Psych: Normal affect   Labs    Chemistry Recent Labs  Lab 11/19/18 1302 11/20/18 0414  NA 141 139  K 3.7 3.9  CL 110 110  CO2 19* 20*  GLUCOSE 263* 156*  BUN 18 13  CREATININE 0.95 0.82  CALCIUM 8.5* 8.5*  GFRNONAA >60 >60  GFRAA >60 >60  ANIONGAP 12 9     Hematology Recent Labs  Lab 11/19/18 1302 11/20/18 0414  WBC 7.1 6.6  RBC 5.01 4.66  HGB 14.9 13.8  HCT 44.5 40.2  MCV 88.8 86.3  MCH 29.7 29.6  MCHC 33.5 34.3  RDW 12.3 12.3  PLT 126* 112*    Cardiac Enzymes Recent Labs  Lab 11/19/18 1302  TROPONINI 0.30*   No results for input(s): TROPIPOC in the last 168 hours.   BNPNo results for input(s): BNP, PROBNP in the last 168 hours.   DDimer No results for input(s): DDIMER in the last 168 hours.   Radiology    Dg Chest 2 View  Result Date: 11/19/2018 CLINICAL DATA:  Acute LEFT chest pain for 1 week. EXAM: CHEST - 2 VIEW COMPARISON:  09/11/2008 and prior chest FINDINGS: Cardiomegaly and mild peribronchial thickening again noted. There is no evidence of  focal airspace disease, pulmonary edema, suspicious pulmonary nodule/mass, pleural effusion, or pneumothorax. No acute bony abnormalities are identified. IMPRESSION: Cardiomegaly without evidence of acute cardiopulmonary disease. Electronically Signed   By: Margarette Canada M.D.   On: 11/19/2018 13:19    Cardiac Studies   Procedures   LEFT HEART CATH AND CORONARY ANGIOGRAPHY  Conclusion     Prox RCA to Mid RCA lesion is 100% stenosed.  Ost 1st Mrg to 1st Mrg lesion is 70% stenosed.  1st Mrg lesion is 70% stenosed.  Mid Cx to Dist Cx lesion is 30% stenosed.  Dist LM to Prox LAD lesion is 40% stenosed.  Ost 1st Sept lesion is 90% stenosed.  Prox LAD lesion is 95% stenosed.  Ost 2nd Diag to 2nd Diag lesion is 80% stenosed.  Mid LAD to Dist LAD lesion is 85% stenosed.  Ost Cx to Prox Cx lesion is 50% stenosed.  The left ventricular systolic function is normal.  LV  end diastolic pressure is low.   Severe multivessel CAD with evidence for coronary calcification.    The left main is short and immediately bifurcates into a large LAD and dominant left circumflex vessel.    The LAD has diffuse 40% very proximal stenosis to the first septal perforating artery.  There is 90% stenosis in this large first septal followed by 95% stenosis in the LAD arising at sharp angle after the septal perforating artery.  The diagonal vessel has 80% mid stenosis.  The LAD beyond the diagonal in the mid LAD is 60% stenosed.  There is diffuse 80 to 85% mid distal LAD stenosis extending almost to the apex.  The left circumflex is a dominant vessel that has proximal stenosis of 50% and gives rise to a large first marginal branch which extends to the apex.  There is a long stent in the proximal portion of this marginal vessel with diffuse 70% in-stent narrowing and additional 70% narrowing beyond the stented segment.  The distal circumflex has 30% narrowing prior to giving rise to the smallPDA/ PLA vessels.  The RCA is a small totally occluded nondominant vessel.  Normal global LV contractility with an EF estimate of approximately 50% with a very small focal mid anterolateral area of hypocontractility and apical hypocontractility.  LVEDP is 5 mmHg.  RECOMMENDATION: With significant diffuse disease, will initially obtain surgical consultation for consideration of CABG revascularization surgery.  During the procedure the patient was started on intravenous nitroglycerin and was titrated to 20 mcg.  Plan for reinstituting heparin therapy 8 hours after TR band is removed.    Echo: IMPRESSIONS    1. Mild hypokinesis of the left ventricular, mid-apical anteroseptal wall, anterior wall and anterolateral wall.  2. Moderate hypokinesis of the left ventricular, entire apical segment.  3. The left ventricle has low normal systolic function, with an ejection fraction of 50-55%. The  cavity size was normal. There is mild concentric left ventricular hypertrophy. Left ventricular diastolic Doppler parameters are consistent with impaired  relaxation.  4. The right ventricle has normal systolic function. The cavity was normal. There is no increase in right ventricular wall thickness. Right ventricular systolic pressure could not be assessed.  5. Left atrial size was not assessed.  6. Small to moderate pericardial effusion.  7. The pericardial effusion is circumferential.  8. The mitral valve is grossly normal. Mild calcification of the mitral valve leaflet.  9. The aortic valve is tricuspid. Mild thickening of the aortic valve. Moderate calcification of the aortic valve. Aortic  valve regurgitation is trivial by color flow Doppler.   Patient Profile     73 y.o. male with CAD s/p remote PCI, hypertension, and type 2 diabetes mellitus, who presents to the [facility] ED for evaluation of unstable angina.  Assessment & Plan    1. NSTEMI - Patient presents with progressive chest pain that radiates to left arm for the past 2-3 days. Patient has history of CAD s/p 2 stents several years ago. - EKG shows marked T wave inversions in anterolateral leads (no prior tracings for comparison). - Initial troponin 0.30. - Patient currently chest pain free.  - on IV Ntg and IV heparin. - Continue Aspirin 81mg  daily. - on high dose statin and beta blocker - Cardiac cath demonstrates severe 3 vessel obstructive CAD. EF 50%. Echo with EF 50-55%. Small to moderate pericardial effusion - evaluated by CT surgery. Dr Cyndia Bent. Anticipate CABG in am.  2. Hypertension - BP now with good control  3. Type 2 Diabetes Mellitus - Patient on Metformin 750mg  daily at home- now on hold - A1c 6.2%.  - on sliding scale insulin.  For questions or updates, please contact [facility] Please consult [email or web address] for contact info under        Signed, Theodus Ran Martinique, MD  11/20/2018, 9:10 AM

## 2018-11-20 NOTE — Anesthesia Preprocedure Evaluation (Addendum)
Anesthesia Evaluation  Patient identified by MRN, date of birth, ID band Patient awake    Reviewed: Allergy & Precautions, H&P , NPO status , Patient's Chart, lab work & pertinent test results  Airway Mallampati: II  TM Distance: >3 FB     Dental  (+) Dental Advidsory Given   Pulmonary neg pulmonary ROS,    breath sounds clear to auscultation       Cardiovascular hypertension, Pt. on medications + CAD and + Past MI   Rhythm:Regular Rate:Normal     Neuro/Psych negative neurological ROS     GI/Hepatic negative GI ROS, Neg liver ROS,   Endo/Other  diabetes, Type 2  Renal/GU negative Renal ROS     Musculoskeletal   Abdominal   Peds  Hematology negative hematology ROS (+)   Anesthesia Other Findings   Reproductive/Obstetrics                           Lab Results  Component Value Date   WBC 6.6 11/20/2018   HGB 13.8 11/20/2018   HCT 40.2 11/20/2018   MCV 86.3 11/20/2018   PLT 112 (L) 11/20/2018   Lab Results  Component Value Date   CREATININE 0.82 11/20/2018   BUN 13 11/20/2018   NA 139 11/20/2018   K 3.9 11/20/2018   CL 110 11/20/2018   CO2 20 (L) 11/20/2018    Anesthesia Physical Anesthesia Plan  ASA: IV  Anesthesia Plan: General   Post-op Pain Management:    Induction: Intravenous  PONV Risk Score and Plan: 2 and Dexamethasone and Ondansetron  Airway Management Planned: Oral ETT  Additional Equipment: Arterial line, CVP, PA Cath, TEE and Ultrasound Guidance Line Placement  Intra-op Plan:   Post-operative Plan: Post-operative intubation/ventilation  Informed Consent: I have reviewed the patients History and Physical, chart, labs and discussed the procedure including the risks, benefits and alternatives for the proposed anesthesia with the patient or authorized representative who has indicated his/her understanding and acceptance.     Dental Advisory Given  Plan  Discussed with: CRNA  Anesthesia Plan Comments:       Anesthesia Quick Evaluation

## 2018-11-20 NOTE — Consult Note (Addendum)
[address]       [name] [address]             [phone]      Cardiothoracic Surgery Consultation  Reason for Consult: Severe multivessel coronary disease status post non-ST segment elevation MI Referring Physician: Dr. Linzie Collin. is an 73 y.o. male.  HPI:   The patient is a 73 year old gentleman with a history of type 2 diabetes, hypertension, and coronary artery disease status post PCI with stents in the past at [facility].  He believes this was in the late 1990s.  He has done relatively well over the years although he relates that over the past year he has had times when he felt good and other times when he felt wiped out.  He now presents with a 2 to 3-day history of intermittent substernal left chest pain with radiation left arm associated with exertion.  Yesterday morning he had 10 out of 10 chest and left arm pain while walking and sat down to check his blood pressure which was markedly elevated.  He took a sublingual nitroglycerin that he had with near complete resolution of his pain and a drop in his blood pressure to mildly elevated levels.  He had associated weakness and nausea.  On arrival to the emergency room his electrocardiogram showed sinus rhythm with T wave inversions in the anterolateral leads.  His initial troponin was elevated at 0.30.  He underwent cardiac catheterization yesterday showing severe three-vessel coronary disease with ejection fraction estimated at 50% with focal mid anterolateral and apical hypokinesis.  LVEDP was 5 mmHg.  He has been on heparin and nitroglycerin since catheterization with no further symptoms.  The patient is here alone today due to the coronavirus pandemic.  He lives at home with a 37 year old grandson who he has cared for.  His wife has been in a nursing home for several years due to dementia.  He cared for her for many years at home but it became too much for him to handle alone.   He has never smoked but is been chewing tobacco since age 61.  He says that he only completed third grade because he had to start working to help provide for his family so he cannot read and can only write his name.  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    Past Medical History:  Diagnosis Date   Diabetes mellitus without complication ([place])    type 2   Hypertension     Past Surgical History:  Procedure Laterality Date   CORONARY ANGIOPLASTY WITH STENT PLACEMENT  1998   LEFT HEART CATH AND CORONARY ANGIOGRAPHY N/A 11/19/2018   Procedure: LEFT HEART CATH AND CORONARY ANGIOGRAPHY;  Surgeon: Troy Sine, MD;  Location: [facility];  Service: Cardiovascular;  Laterality: N/A;    History reviewed. No pertinent family history.  Social History:  reports that he has never smoked. He has never used smokeless tobacco. He reports previous alcohol use. He reports that he does not use drugs.  Allergies:  Allergies  Allergen Reactions   Penicillins Itching    Medications:  I have reviewed the patient's current medications. Prior to Admission:  Medications Prior to Admission  Medication Sig Dispense Refill Last Dose   amLODipine (NORVASC) 10 MG tablet Take 10 mg by mouth daily.   11/18/2018 at Unknown time   aspirin EC  81 MG tablet Take 81 mg by mouth daily.   11/18/2018 at Unknown time   diphenoxylate-atropine (LOMOTIL) 2.5-0.025 MG tablet Take 1 tablet by mouth 4 (four) times daily as needed for diarrhea or loose stools.   11/18/2018 at Unknown time   ferrous sulfate (FEROSUL) 325 (65 FE) MG tablet Take 325 mg by mouth at bedtime.   11/18/2018 at Unknown time   lisinopril (ZESTRIL) 40 MG tablet Take 40 mg by mouth daily.   11/19/2018 at Unknown time   metFORMIN (GLUCOPHAGE-XR) 750 MG 24 hr tablet Take 750 mg by mouth at bedtime.   11/18/2018 at Unknown time   omeprazole (PRILOSEC) 20 MG capsule Take 20 mg by mouth every morning.    11/19/2018 at Unknown time   Scheduled:  amLODipine  10 mg Oral Daily   aspirin EC  81 mg Oral Daily   atorvastatin  80 mg Oral q1800   carvedilol  6.25 mg Oral BID WC   ferrous sulfate  325 mg Oral QHS   insulin aspart  0-15 Units Subcutaneous TID WC   lisinopril  40 mg Oral Daily   pantoprazole  40 mg Oral Daily   sodium chloride flush  3 mL Intravenous Once   sodium chloride flush  3 mL Intravenous Q12H   Continuous:  sodium chloride 70 mL/hr at 11/20/18 0210   sodium chloride     heparin 950 Units/hr (11/20/18 0600)   nitroGLYCERIN 10 mcg/min (11/19/18 2200)   UMP:NTIRWE chloride, acetaminophen, diazepam, diphenoxylate-atropine, nitroGLYCERIN, ondansetron (ZOFRAN) IV, sodium chloride flush, zolpidem Anti-infectives (From admission, onward)   None      Results for orders placed or performed during the hospital encounter of 11/19/18 (from the past 48 hour(s))  Basic metabolic panel     Status: Abnormal   Collection Time: 11/19/18  1:02 PM  Result Value Ref Range   Sodium 141 135 - 145 mmol/L   Potassium 3.7 3.5 - 5.1 mmol/L   Chloride 110 98 - 111 mmol/L   CO2 19 (L) 22 - 32 mmol/L   Glucose, Bld 263 (H) 70 - 99 mg/dL   BUN 18 8 - 23 mg/dL   Creatinine, Ser 0.95 0.61 - 1.24 mg/dL   Calcium 8.5 (L) 8.9 - 10.3 mg/dL   GFR calc non Af Amer >60 >60 mL/min   GFR calc Af Amer >60 >60 mL/min   Anion gap 12 5 - 15    Comment: Performed at [facility], [place] [address], [place], [place] [address]  CBC     Status: Abnormal   Collection Time: 11/19/18  1:02 PM  Result Value Ref Range   WBC 7.1 4.0 - 10.5 K/uL   RBC 5.01 4.22 - 5.81 MIL/uL   Hemoglobin 14.9 13.0 - 17.0 g/dL   HCT 44.5 39.0 - 52.0 %   MCV 88.8 80.0 - 100.0 fL   MCH 29.7 26.0 - 34.0 pg   MCHC 33.5 30.0 - 36.0 g/dL   RDW 12.3 11.5 - 15.5 %   Platelets 126 (L) 150 - 400 K/uL   nRBC 0.0 0.0 - 0.2 %    Comment: Performed at [facility], [place] [address] [place], [place] [address]    Troponin I - ONCE - STAT     Status: Abnormal   Collection Time: 11/19/18  1:02 PM  Result Value Ref Range   Troponin I 0.30 (HH) <0.03 ng/mL    Comment: CRITICAL RESULT CALLED TO, READ BACK BY AND VERIFIED WITH:  PULLIAM,P RN @ [phone] 11/19/18 LEONARD,A Performed at [facility], [place] [address], [place], [place] [address]   POCT Activated clotting time     Status: None   Collection Time: 11/19/18  4:54 PM  Result Value Ref Range   Activated Clotting Time 180 seconds  Glucose, capillary     Status: Abnormal   Collection Time: 11/19/18  6:10 PM  Result Value Ref Range   Glucose-Capillary 103 (H) 70 - 99 mg/dL  Glucose, capillary     Status: Abnormal   Collection Time: 11/19/18  9:27 PM  Result Value Ref Range   Glucose-Capillary 150 (H) 70 - 99 mg/dL  CBC     Status: Abnormal   Collection Time: 11/20/18  4:14 AM  Result Value Ref Range   WBC 6.6 4.0 - 10.5 K/uL   RBC 4.66 4.22 - 5.81 MIL/uL   Hemoglobin 13.8 13.0 - 17.0 g/dL   HCT 40.2 39.0 - 52.0 %   MCV 86.3 80.0 - 100.0 fL   MCH 29.6 26.0 - 34.0 pg   MCHC 34.3 30.0 - 36.0 g/dL   RDW 12.3 11.5 - 15.5 %   Platelets 112 (L) 150 - 400 K/uL    Comment: REPEATED TO VERIFY PLATELET COUNT CONFIRMED BY SMEAR SPECIMEN CHECKED FOR CLOTS Immature Platelet Fraction may be clinically indicated, consider ordering this additional test [id number]    nRBC 0.0 0.0 - 0.2 %    Comment: Performed at [facility], [place] [address] [place], [place] [address]  Hemoglobin A1c     Status: Abnormal   Collection Time: 11/20/18  4:14 AM  Result Value Ref Range   Hgb A1c MFr Bld 6.2 (H) 4.8 - 5.6 %    Comment: (NOTE) Pre diabetes:          5.7%-6.4% Diabetes:              >6.4% Glycemic control for   <7.0% adults with diabetes    Mean Plasma Glucose 131.24 mg/dL    Comment: Performed at [facility] [address], [place], [place] [address]  Lipid panel     Status: Abnormal   Collection Time: 11/20/18  4:14 AM  Result Value  Ref Range   Cholesterol 117 0 - 200 mg/dL   Triglycerides 171 (H) <150 mg/dL   HDL 23 (L) >40 mg/dL   Total CHOL/HDL Ratio 5.1 RATIO   VLDL 34 0 - 40 mg/dL   LDL Cholesterol 60 0 - 99 mg/dL    Comment:        Total Cholesterol/HDL:CHD Risk Coronary Heart Disease Risk Table                     Men   Women  1/2 Average Risk   3.4   3.3  Average Risk       5.0   4.4  2 X Average Risk   9.6   7.1  3 X Average Risk  23.4   11.0        Use the calculated Patient Ratio above and the CHD Risk Table to determine the patient's CHD Risk.        ATP III CLASSIFICATION (LDL):  <100     mg/dL   Optimal  100-129  mg/dL   Near or Above                    Optimal  130-159  mg/dL   Borderline  160-189  mg/dL   High  >190     mg/dL   Very High Performed at [facility] [address], [place], [place] [address]   Basic metabolic panel     Status: Abnormal   Collection Time: 11/20/18  4:14 AM  Result Value Ref Range   Sodium 139 135 - 145 mmol/L   Potassium 3.9 3.5 - 5.1 mmol/L   Chloride 110 98 - 111 mmol/L   CO2 20 (L) 22 - 32 mmol/L   Glucose, Bld 156 (H) 70 - 99 mg/dL   BUN 13 8 - 23 mg/dL   Creatinine, Ser 0.82 0.61 - 1.24 mg/dL   Calcium 8.5 (L) 8.9 - 10.3 mg/dL   GFR calc non Af Amer >60 >60 mL/min   GFR calc Af Amer >60 >60 mL/min   Anion gap 9 5 - 15    Comment: Performed at [facility] [address] [place], [place] [address]  Glucose, capillary     Status: Abnormal   Collection Time: 11/20/18  8:13 AM  Result Value Ref Range   Glucose-Capillary 144 (H) 70 - 99 mg/dL    Dg Chest 2 View  Result Date: 11/19/2018 CLINICAL DATA:  Acute LEFT chest pain for 1 week. EXAM: CHEST - 2 VIEW COMPARISON:  09/11/2008 and prior chest FINDINGS: Cardiomegaly and mild peribronchial thickening again noted. There is no evidence of focal airspace disease, pulmonary edema, suspicious pulmonary nodule/mass, pleural effusion, or pneumothorax. No acute bony abnormalities are  identified. IMPRESSION: Cardiomegaly without evidence of acute cardiopulmonary disease. Electronically Signed   By: Margarette Canada M.D.   On: 11/19/2018 13:19    Review of Systems  Constitutional: Positive for malaise/fatigue. Negative for chills and fever.  HENT: Negative.        Edentulous  Eyes: Negative.   Respiratory: Negative for cough and shortness of breath.   Cardiovascular: Positive for chest pain. Negative for palpitations, orthopnea, leg swelling and PND.  Gastrointestinal: Positive for nausea. Negative for vomiting.  Genitourinary: Negative.   Musculoskeletal: Positive for back pain and neck pain.       Due to chronic degenerative arthritis with ruptured disc in his back  Skin: Negative.   Neurological: Negative.   Endo/Heme/Allergies: Negative.   Psychiatric/Behavioral: Negative.    Blood pressure 127/75, pulse 82, temperature 97.8 F (36.6 C), temperature source Oral, resp. rate 19, height 5\' 9"  (1.753 m), weight 77.6 kg, SpO2 99 %. Physical Exam  Constitutional: He is oriented to person, place, and time. He appears well-developed and well-nourished. No distress.  HENT:  Head: Normocephalic and atraumatic.  Mouth/Throat: Oropharynx is clear and moist.  Eyes: Pupils are equal, round, and reactive to light. Conjunctivae and EOM are normal.  Neck: Normal range of motion. No JVD present. No thyromegaly present.  Cardiovascular: Normal rate, regular rhythm, normal heart sounds and intact distal pulses.  No murmur heard. Respiratory: Effort normal and breath sounds normal. No respiratory distress.  GI: Soft. Bowel sounds are normal. He exhibits no distension. There is no abdominal tenderness.  Musculoskeletal: Normal range of motion.        General: No edema.  Lymphadenopathy:    He has no cervical adenopathy.  Neurological: He is alert and oriented to person, place, and time. He has normal strength. No cranial nerve deficit or sensory deficit.  Skin: Skin is warm and dry.    Psychiatric: He has a normal mood and affect.   Physicians   Panel Physicians Referring  Physician Case Authorizing Physician  Troy Sine, MD (Primary)    Procedures   LEFT HEART CATH AND CORONARY ANGIOGRAPHY  Conclusion     Prox RCA to Mid RCA lesion is 100% stenosed.  Ost 1st Mrg to 1st Mrg lesion is 70% stenosed.  1st Mrg lesion is 70% stenosed.  Mid Cx to Dist Cx lesion is 30% stenosed.  Dist LM to Prox LAD lesion is 40% stenosed.  Ost 1st Sept lesion is 90% stenosed.  Prox LAD lesion is 95% stenosed.  Ost 2nd Diag to 2nd Diag lesion is 80% stenosed.  Mid LAD to Dist LAD lesion is 85% stenosed.  Ost Cx to Prox Cx lesion is 50% stenosed.  The left ventricular systolic function is normal.  LV end diastolic pressure is low.   Severe multivessel CAD with evidence for coronary calcification.    The left main is short and immediately bifurcates into a large LAD and dominant left circumflex vessel.    The LAD has diffuse 40% very proximal stenosis to the first septal perforating artery.  There is 90% stenosis in this large first septal followed by 95% stenosis in the LAD arising at sharp angle after the septal perforating artery.  The diagonal vessel has 80% mid stenosis.  The LAD beyond the diagonal in the mid LAD is 60% stenosed.  There is diffuse 80 to 85% mid distal LAD stenosis extending almost to the apex.  The left circumflex is a dominant vessel that has proximal stenosis of 50% and gives rise to a large first marginal branch which extends to the apex.  There is a long stent in the proximal portion of this marginal vessel with diffuse 70% in-stent narrowing and additional 70% narrowing beyond the stented segment.  The distal circumflex has 30% narrowing prior to giving rise to the smallPDA/ PLA vessels.  The RCA is a small totally occluded nondominant vessel.  Normal global LV contractility with an EF estimate of approximately 50% with a very small  focal mid anterolateral area of hypocontractility and apical hypocontractility.  LVEDP is 5 mmHg.  RECOMMENDATION: With significant diffuse disease, will initially obtain surgical consultation for consideration of CABG revascularization surgery.  During the procedure the patient was started on intravenous nitroglycerin and was titrated to 20 mcg.  Plan for reinstituting heparin therapy 8 hours after TR band is removed.   Recommendations   Antiplatelet/Anticoag Recommend Aspirin 81mg  daily for moderate CAD.  Discharge Date Anticipated discharge date to be determined.  Indications   Non-ST elevation (NSTEMI) myocardial infarction ([place]) [I21.4 (ICD-10-CM)]  Procedural Details   Technical Details Mr. Thad Osoria is a 73 year old gentleman who has known CAD, hypertension, and diabetes mellitus.  He has undergone 2 prior stent procedures.  His were done in [facility].  We did not know the specifics.  He has not been on any lipid-lowering therapy.  For the past 2 to 3 days he has noticed recurrent episodes of chest pressure and tightness.  This morning, he experienced severe chest pain ultimately leading to his emergency room evaluation.  His chest pain ultimately subsided with nitro glycerin sublingually.  He was initially significantly hypertensive at home prior to ER arrival.  His ECG shows diffuse anterolateral T wave inversion.  Initial troponin is mildly positive consistent with an NSTEMI.  He has brought to the catheterization laboratory for urgent cardiac catheterization.  Upon arrival to the catheterization laboratory, the patient was pain-free.  In the ER he had received heparin 4000 and was  started on a drip.  His drip was discontinued upon Cath Lab arrival.  He was premedicated with Versed 1 mg and fentanyl 25 mcg. A right radial approach was utilized after an Allen's test verified adequate circulation. The right radial artery was punctured via the Seldinger technique, and a 6 [place]  Glidesheath Slender was inserted without difficulty.  A radial cocktail consisting of Verapamil 3 mg was administered. The patient received an additional 2000 units of heparin. A safety J wire was advanced into the ascending aorta. Diagnostic catheterization was done with a 5 [place] TIG 4.0 catheter. A 5 French pigtail catheter was used for left ventriculography. A TR radial band was applied for hemostasis. The patient left the catheterization laboratory in stable condition.   Estimated blood loss <50 mL.   During this procedure medications were administered to achieve and maintain moderate conscious sedation while the patient's heart rate, blood pressure, and oxygen saturation were continuously monitored and I was present face-to-face 100% of this time.  Medications  (Filter: Administrations occurring from 11/19/18 1600 to 11/19/18 1716)  Medication Rate/Dose/Volume Action  Date Time   0.9 % sodium chloride infusion (mL/hr) 10 mL/hr New Bag/Given 11/19/18 1618   Dosing weight:  77.1 kg *100 mL/hr Rate/Dose Change 1705   Total dose as of 11/20/18 0846        Cannot be calculated        midazolam (VERSED) injection (mg) 1 mg Given 11/19/18 1630   Total dose as of 11/20/18 0846        1 mg        fentaNYL (SUBLIMAZE) injection (mcg) 25 mcg Given 11/19/18 1630   Total dose as of 11/20/18 0846        25 mcg        lidocaine (PF) (XYLOCAINE) 1 % injection (mL) 2 mL Given 11/19/18 1633   Total dose as of 11/20/18 0846        2 mL        Radial Cocktail/Verapamil only (mL)  Given 11/19/18 1635   Total dose as of 11/20/18 0846        Cannot be calculated        heparin injection (Units) 2,000 Units Given 11/19/18 1637   Total dose as of 11/20/18 0846        2,000 Units        nitroGLYCERIN 50 mg in dextrose 5 % 250 mL (0.2 mg/mL) infusion (mcg/min) 10 mcg/min - 3 mL/hr New Bag/Given 11/19/18 1641   Dosing weight:  77.1 kg *20 mcg/min - 6 mL/hr Rate/Dose Change 1656   Total dose as of 11/20/18  0846        Cannot be calculated        iohexol (OMNIPAQUE) 350 MG/ML injection (mL) 75 mL Given 11/19/18 1659   Total dose as of 11/20/18 0846        75 mL        nitroGLYCERIN (NITROSTAT) SL tablet 0.4 mg (mg) *Not included in total [facility] Hold 11/19/18 1609   Total dose as of 11/20/18 0846        Cannot be calculated        sodium chloride flush (NS) 0.9 % injection 3 mL (mL) *Not included in total [facility] Hold 11/19/18 1609   Total dose as of 11/20/18 0846        Cannot be calculated        heparin ADULT infusion 100 units/mL (25000 units/289mL sodium chloride 0.45%) (Units/hr)  Stopped 11/19/18 1602   Dosing weight:  77.1 kg        Total dose as of 11/20/18 0846        Cannot be calculated        Sedation Time   Sedation Time Physician-1: 25 minutes 15 seconds  Coronary Findings   Diagnostic  Dominance: Left  Left Main  Dist LM to Prox LAD lesion 40% stenosed  Dist LM to Prox LAD lesion is 40% stenosed.  Left Anterior Descending  Prox LAD lesion 95% stenosed  Prox LAD lesion is 95% stenosed.  Mid LAD to Dist LAD lesion 85% stenosed  Mid LAD to Dist LAD lesion is 85% stenosed.  First Septal Branch  Ost 1st Sept lesion 90% stenosed  Ost 1st Sept lesion is 90% stenosed.  Second Diagonal [employer] 2nd Diag to 2nd Diag lesion 80% stenosed  Ost 2nd Diag to 2nd Diag lesion is 80% stenosed.  Left Circumflex  Ost Cx to Prox Cx lesion 50% stenosed  Ost Cx to Prox Cx lesion is 50% stenosed.  Mid Cx to Dist Cx lesion 30% stenosed  Mid Cx to Dist Cx lesion is 30% stenosed.  First Obtuse Marginal Branch  Ost 1st Mrg to 1st Mrg lesion 70% stenosed  Ost 1st Mrg to 1st Mrg lesion is 70% stenosed. The lesion was previously treated.  1st Mrg lesion 70% stenosed  1st Mrg lesion is 70% stenosed.  Right Coronary Artery  Prox RCA to Mid RCA lesion 100% stenosed  Prox RCA to Mid RCA lesion is 100% stenosed.  Intervention   No interventions have been documented.  Wall Motion   Resting        The mid inferior, basilar anterior, basilar inferior and apical anterior segments are normal. The mid anterior and apical inferior segments are hypokinetic.           Left Heart   Left Ventricle The left ventricular size is normal. The left ventricular systolic function is normal. LV end diastolic pressure is low. There are LV function abnormalities. There is low normal LV function with an EF estimate at approximately 50%.  There is a small focal area of mid anterolateral hypokinesis as well as apical hypokinesis.  LV EDP is 5 mmHg.  Coronary Diagrams   Diagnostic  Dominance: Left    Intervention   Implants    No implant documentation for this case.  Syngo Images   Show images for CARDIAC CATHETERIZATION  MERGE Images   Show images for CARDIAC CATHETERIZATION   Link to Procedure Log   Procedure Log    Hemo Data    Most Recent Value  AO Systolic Pressure 017 mmHg  AO Diastolic Pressure 63 mmHg  AO Mean 87 mmHg  LV Systolic Pressure 510 mmHg  LV Diastolic Pressure 1 mmHg  LV EDP 5 mmHg  AOp Systolic Pressure 258 mmHg  AOp Diastolic Pressure 61 mmHg  AOp Mean Pressure 83 mmHg  LVp Systolic Pressure 527 mmHg  LVp Diastolic Pressure 0 mmHg  LVp EDP Pressure 4 mmHg     ECHOCARDIOGRAM REPORT       Patient Name:   Keith Lucero. Date of Exam: 11/19/2018 Medical Rec #:  [id number]             Height:       69.0 in Accession #:    [id number]            Weight:       170.0  lb Date of Birth:  Aug 13, 1945             BSA:          1.93 m Patient Age:    67 years              BP:           143/83 mmHg Patient Gender: M                     HR:           72 bpm. Exam Location:  Inpatient    Procedure: 2D Echo, Cardiac Doppler and Color Doppler  Indications:    Chest Pain 786.50 / R07.9   History:        Patient has no prior history of Echocardiogram examinations.                 STEMI.   Sonographer:    Madelaine Etienne RDCS (AE) Referring Phys: Marnee Spring Dunlevy    1. Mild hypokinesis of the left ventricular, mid-apical anteroseptal wall, anterior wall and anterolateral wall.  2. Moderate hypokinesis of the left ventricular, entire apical segment.  3. The left ventricle has low normal systolic function, with an ejection fraction of 50-55%. The cavity size was normal. There is mild concentric left ventricular hypertrophy. Left ventricular diastolic Doppler parameters are consistent with impaired  relaxation.  4. The right ventricle has normal systolic function. The cavity was normal. There is no increase in right ventricular wall thickness. Right ventricular systolic pressure could not be assessed.  5. Left atrial size was not assessed.  6. Small to moderate pericardial effusion.  7. The pericardial effusion is circumferential.  8. The mitral valve is grossly normal. Mild calcification of the mitral valve leaflet.  9. The aortic valve is tricuspid. Mild thickening of the aortic valve. Moderate calcification of the aortic valve. Aortic valve regurgitation is trivial by color flow Doppler.  FINDINGS  Left Ventricle: The left ventricle has low normal systolic function, with an ejection fraction of 50-55%. The cavity size was normal. There is mild concentric left ventricular hypertrophy. Left ventricular diastolic Doppler parameters are consistent  with impaired relaxation. Mild hypokinesis of the left ventricular, mid-apical anteroseptal wall, anterior wall and anterolateral wall. Moderate hypokinesis of the left ventricular, entire apical segment.  Right Ventricle: The right ventricle has normal systolic function. The cavity was normal. There is no increase in right ventricular wall thickness. Right ventricular systolic pressure could not be assessed.  Left Atrium: Left atrial size was not assessed.  Right Atrium: Right atrial size was not assessed. Right atrial pressure is estimated at 10 mmHg.  Interatrial Septum: No  atrial level shunt detected by color flow Doppler.  Pericardium: Small to moderate pericardial effusion. The pericardial effusion is circumferential.  Mitral Valve: The mitral valve is grossly normal. Mild calcification of the mitral valve leaflet. Mitral valve regurgitation is trivial by color flow Doppler.  Tricuspid Valve: The tricuspid valve is normal in structure. Tricuspid valve regurgitation was not visualized by color flow Doppler.  Aortic Valve: The aortic valve is tricuspid Mild thickening of the aortic valve. Moderate calcification of the aortic valve. Aortic valve regurgitation is trivial by color flow Doppler. There is no evidence of aortic valve stenosis.  Pulmonic Valve: The pulmonic valve was grossly normal. Pulmonic valve regurgitation is not visualized by color flow Doppler.  Pulmonary Artery: The pulmonary artery is not well seen.  Venous:  The inferior vena cava is normal in size with greater than 50% respiratory variability.    +--------------+-------++  LEFT VENTRICLE           +--------------+-------++  PLAX 2D                  +--------------+-------++  LVIDd:         3.80 cm   +--------------+-------++  LVIDs:         1.90 cm   +--------------+-------++  LV PW:         1.20 cm   +--------------+-------++  LV IVS:        1.30 cm   +--------------+-------++  LV SV:         51 ml     +--------------+-------++  LV SV Index:   26.09     +--------------+-------++                           +--------------+-------++   +------------------+---------++  LV Volumes (MOD)               +------------------+---------++  LV area d, A2C:    36.60 cm   +------------------+---------++  LV area d, A4C:    34.60 cm   +------------------+---------++  LV area s, A2C:    23.80 cm   +------------------+---------++  LV area s, A4C:    23.70 cm   +------------------+---------++  LV major d, A2C:   8.83 cm     +------------------+---------++  LV major d, A4C:    8.47 cm     +------------------+---------++  LV major s, A2C:   8.15 cm     +------------------+---------++  LV major s, A4C:   7.85 cm     +------------------+---------++  LV vol d, MOD A2C: 125.0 ml    +------------------+---------++  LV vol d, MOD A4C: 115.0 ml    +------------------+---------++  LV vol s, MOD A2C: 56.9 ml     +------------------+---------++  LV vol s, MOD A4C: 59.1 ml     +------------------+---------++  LV SV MOD A2C:     68.1 ml     +------------------+---------++  LV SV MOD A4C:     115.0 ml    +------------------+---------++  LV SV MOD BP:      63.4 ml     +------------------+---------++  +-----------+-------++----------++  LEFT ATRIUM          Index        +-----------+-------++----------++  LA diam:    3.90 cm  2.02 cm/m   +-----------+-------++----------++    +-------------+-------++  AORTA                   +-------------+-------++  Ao Root diam: 3.10 cm   +-------------+-------++  Ao Asc diam:  3.00 cm   +-------------+-------++  +--------------+----------++  MITRAL VALVE                +--------------+----------++  MV Area (PHT): 2.74 cm     +--------------+----------++  MV PHT:        80.33 msec   +--------------+----------++  MV Decel Time: 277 msec     +--------------+----------++ +--------------+-----------++  MV E velocity: 63.80 cm/s    +--------------+-----------++  MV A velocity: 108.00 cm/s   +--------------+-----------++  MV E/A ratio:  0.59          +--------------+-----------++    Buford Dresser MD Electronically signed by Buford Dresser MD Signature Date/Time: 11/19/2018/6:41:22 PM     Assessment/Plan:  This 73 year old gentleman  has severe multivessel coronary disease presenting with a non-ST segment elevation MI.  Echocardiogram shows mild anterior septal, anterolateral, and apical wall motion abnormality with an overall normal left ventricular ejection fraction of 50 to 55%.  He is now  stable on heparin and nitroglycerin.  I agree that coronary bypass graft surgery is the best treatment for this patient with diabetes and multivessel disease. I discussed the operative procedure with the patient including alternatives, benefits and risks; including but not limited to bleeding, blood transfusion, infection, stroke, myocardial infarction, graft failure, heart block requiring a permanent pacemaker, organ dysfunction, and death.  [name] understands and agrees to proceed.  We will schedule surgery for Thursday.  I spent 40 minutes performing this consultation and > 50% of this time was spent face to face counseling and coordinating the care of this patient's severe multivessel coronary disease.  Gaye Pollack 11/20/2018, 8:41 AM   I talked with his daughter Brayten Komar by phone at [phone] and explained all of the above with her and answered her questions. She is in agreement with him proceeding with surgery tomorrow.

## 2018-11-21 ENCOUNTER — Inpatient Hospital Stay (HOSPITAL_COMMUNITY)
Admission: EM | Disposition: A | Discharge status: Home Health | Payer: Self-pay | Source: Home / Self Care | Attending: Surgery

## 2018-11-21 ENCOUNTER — Inpatient Hospital Stay (HOSPITAL_COMMUNITY): Payer: PPO | Admitting: Anesthesiology

## 2018-11-21 ENCOUNTER — Inpatient Hospital Stay (HOSPITAL_COMMUNITY): Payer: PPO

## 2018-11-21 DIAGNOSIS — Z951 Presence of aortocoronary bypass graft: Secondary | ICD-10-CM

## 2018-11-21 DIAGNOSIS — I25110 Atherosclerotic heart disease of native coronary artery with unstable angina pectoris: Secondary | ICD-10-CM

## 2018-11-21 HISTORY — DX: Presence of aortocoronary bypass graft: Z95.1

## 2018-11-21 HISTORY — PX: CORONARY ARTERY BYPASS GRAFT: SHX141

## 2018-11-21 HISTORY — PX: TEE WITHOUT CARDIOVERSION: SHX5443

## 2018-11-21 LAB — GLUCOSE, CAPILLARY
Glucose-Capillary: 116 mg/dL — ABNORMAL HIGH (ref 70–99)
Glucose-Capillary: 99 mg/dL (ref 70–99)
Glucose-Capillary: 102 mg/dL — ABNORMAL HIGH (ref 70–99)
Glucose-Capillary: 101 mg/dL — ABNORMAL HIGH (ref 70–99)
Glucose-Capillary: 93 mg/dL (ref 70–99)
Glucose-Capillary: 121 mg/dL — ABNORMAL HIGH (ref 70–99)
Glucose-Capillary: 125 mg/dL — ABNORMAL HIGH (ref 70–99)
Glucose-Capillary: 88 mg/dL (ref 70–99)
Glucose-Capillary: 112 mg/dL — ABNORMAL HIGH (ref 70–99)
Glucose-Capillary: 77 mg/dL (ref 70–99)

## 2018-11-21 LAB — CBC
HCT: 40.1 % (ref 39.0–52.0)
HCT: 30.9 % — ABNORMAL LOW (ref 39.0–52.0)
HCT: 35.7 % — ABNORMAL LOW (ref 39.0–52.0)
Hemoglobin: 14.1 g/dL (ref 13.0–17.0)
Hemoglobin: 10.4 g/dL — ABNORMAL LOW (ref 13.0–17.0)
Hemoglobin: 12.2 g/dL — ABNORMAL LOW (ref 13.0–17.0)
MCH: 29.9 pg (ref 26.0–34.0)
MCH: 29.9 pg (ref 26.0–34.0)
MCH: 30.2 pg (ref 26.0–34.0)
MCHC: 35.2 g/dL (ref 30.0–36.0)
MCHC: 33.7 g/dL (ref 30.0–36.0)
MCHC: 34.2 g/dL (ref 30.0–36.0)
MCV: 85.1 fL (ref 80.0–100.0)
MCV: 88.8 fL (ref 80.0–100.0)
MCV: 88.4 fL (ref 80.0–100.0)
Platelets: 107 10*3/uL — ABNORMAL LOW (ref 150–400)
Platelets: 89 10*3/uL — ABNORMAL LOW (ref 150–400)
Platelets: 87 10*3/uL — ABNORMAL LOW (ref 150–400)
RBC: 4.71 MIL/uL (ref 4.22–5.81)
RBC: 3.48 MIL/uL — ABNORMAL LOW (ref 4.22–5.81)
RBC: 4.04 MIL/uL — ABNORMAL LOW (ref 4.22–5.81)
RDW: 12 % (ref 11.5–15.5)
RDW: 12.2 % (ref 11.5–15.5)
RDW: 12.4 % (ref 11.5–15.5)
WBC: 6.3 10*3/uL (ref 4.0–10.5)
WBC: 12.2 10*3/uL — ABNORMAL HIGH (ref 4.0–10.5)
WBC: 13.4 10*3/uL — ABNORMAL HIGH (ref 4.0–10.5)
nRBC: 0 % (ref 0.0–0.2)
nRBC: 0 % (ref 0.0–0.2)
nRBC: 0 % (ref 0.0–0.2)

## 2018-11-21 LAB — POCT I-STAT 4, (NA,K, GLUC, HGB,HCT)
Glucose, Bld: 110 mg/dL — ABNORMAL HIGH (ref 70–99)
HCT: 30 % — ABNORMAL LOW (ref 39.0–52.0)
Hemoglobin: 10.2 g/dL — ABNORMAL LOW (ref 13.0–17.0)
Potassium: 3.8 mmol/L (ref 3.5–5.1)
Sodium: 140 mmol/L (ref 135–145)

## 2018-11-21 LAB — BLOOD GAS, ARTERIAL
Acid-base deficit: 2.8 mmol/L — ABNORMAL HIGH (ref 0.0–2.0)
Bicarbonate: 20.7 mmol/L (ref 20.0–28.0)
Drawn by: 51133
O2 Saturation: 96.6 %
Patient temperature: 98.5
pCO2 arterial: 31.4 mmHg — ABNORMAL LOW (ref 32.0–48.0)
pH, Arterial: 7.436 (ref 7.350–7.450)
pO2, Arterial: 82 mmHg — ABNORMAL LOW (ref 83.0–108.0)

## 2018-11-21 LAB — BASIC METABOLIC PANEL
Anion gap: 10 (ref 5–15)
Anion gap: 9 (ref 5–15)
BUN: 6 mg/dL — ABNORMAL LOW (ref 8–23)
BUN: 8 mg/dL (ref 8–23)
CO2: 19 mmol/L — ABNORMAL LOW (ref 22–32)
CO2: 21 mmol/L — ABNORMAL LOW (ref 22–32)
Calcium: 8.4 mg/dL — ABNORMAL LOW (ref 8.9–10.3)
Calcium: 7.3 mg/dL — ABNORMAL LOW (ref 8.9–10.3)
Chloride: 109 mmol/L (ref 98–111)
Chloride: 108 mmol/L (ref 98–111)
Creatinine, Ser: 0.83 mg/dL (ref 0.61–1.24)
Creatinine, Ser: 0.79 mg/dL (ref 0.61–1.24)
GFR calc Af Amer: >60 mL/min (ref >60)
GFR calc Af Amer: >60 mL/min (ref >60)
GFR calc non Af Amer: >60 mL/min (ref >60)
GFR calc non Af Amer: >60 mL/min (ref >60)
Glucose, Bld: 120 mg/dL — ABNORMAL HIGH (ref 70–99)
Glucose, Bld: 123 mg/dL — ABNORMAL HIGH (ref 70–99)
Potassium: 3.6 mmol/L (ref 3.5–5.1)
Potassium: 4.2 mmol/L (ref 3.5–5.1)
Sodium: 138 mmol/L (ref 135–145)
Sodium: 138 mmol/L (ref 135–145)

## 2018-11-21 LAB — POCT I-STAT 7, (LYTES, BLD GAS, ICA,H+H)
Acid-base deficit: 6 mmol/L — ABNORMAL HIGH (ref 0.0–2.0)
Acid-base deficit: 4 mmol/L — ABNORMAL HIGH (ref 0.0–2.0)
Bicarbonate: 20.1 mmol/L (ref 20.0–28.0)
Bicarbonate: 20.7 mmol/L (ref 20.0–28.0)
Calcium, Ion: 1.11 mmol/L — ABNORMAL LOW (ref 1.15–1.40)
Calcium, Ion: 1.07 mmol/L — ABNORMAL LOW (ref 1.15–1.40)
HCT: 28 % — ABNORMAL LOW (ref 39.0–52.0)
HCT: 33 % — ABNORMAL LOW (ref 39.0–52.0)
Hemoglobin: 9.5 g/dL — ABNORMAL LOW (ref 13.0–17.0)
Hemoglobin: 11.2 g/dL — ABNORMAL LOW (ref 13.0–17.0)
O2 Saturation: 94 %
O2 Saturation: 97 %
Patient temperature: 36
Patient temperature: 36.7
Potassium: 3.7 mmol/L (ref 3.5–5.1)
Potassium: 4.3 mmol/L (ref 3.5–5.1)
Sodium: 140 mmol/L (ref 135–145)
Sodium: 140 mmol/L (ref 135–145)
TCO2: 21 mmol/L — ABNORMAL LOW (ref 22–32)
TCO2: 22 mmol/L (ref 22–32)
pCO2 arterial: 40.9 mmHg (ref 32.0–48.0)
pCO2 arterial: 34.5 mmHg (ref 32.0–48.0)
pH, Arterial: 7.294 — ABNORMAL LOW (ref 7.350–7.450)
pH, Arterial: 7.383 (ref 7.350–7.450)
pO2, Arterial: 75 mmHg — ABNORMAL LOW (ref 83.0–108.0)
pO2, Arterial: 94 mmHg (ref 83.0–108.0)

## 2018-11-21 LAB — ECHO INTRAOPERATIVE TEE
Height: 69 in
Weight: 2712 oz

## 2018-11-21 LAB — PLATELET COUNT: Platelets: 119 10*3/uL — ABNORMAL LOW (ref 150–400)

## 2018-11-21 LAB — APTT: aPTT: 31 seconds (ref 24–36)

## 2018-11-21 LAB — HEMOGLOBIN AND HEMATOCRIT, BLOOD
HCT: 30.4 % — ABNORMAL LOW (ref 39.0–52.0)
Hemoglobin: 10.3 g/dL — ABNORMAL LOW (ref 13.0–17.0)

## 2018-11-21 LAB — ABO/RH: ABO/RH(D): O POS

## 2018-11-21 LAB — MAGNESIUM: Magnesium: 3.4 mg/dL — ABNORMAL HIGH (ref 1.7–2.4)

## 2018-11-21 LAB — PROTIME-INR
INR: 1.9 — ABNORMAL HIGH (ref 0.8–1.2)
Prothrombin Time: 21.3 seconds — ABNORMAL HIGH (ref 11.4–15.2)

## 2018-11-21 SURGERY — CORONARY ARTERY BYPASS GRAFTING (CABG)
Anesthesia: General | Site: Chest

## 2018-11-21 MED ORDER — HEPARIN SODIUM (PORCINE) 1000 UNIT/ML IJ SOLN
INTRAMUSCULAR | Status: AC
  Filled 2018-11-21: qty 1

## 2018-11-21 MED ORDER — ROCURONIUM BROMIDE 10 MG/ML (PF) SYRINGE
PREFILLED_SYRINGE | INTRAVENOUS | Status: DC | PRN
  Administered 2018-11-21 (×2): 50 mg via INTRAVENOUS

## 2018-11-21 MED ORDER — TRAMADOL HCL 50 MG PO TABS: 50.0000 mg | ORAL_TABLET | ORAL | Status: DC | PRN

## 2018-11-21 MED ORDER — DOCUSATE SODIUM 100 MG PO CAPS
200.0000 mg | ORAL_CAPSULE | Freq: Every day | ORAL | Status: DC
  Administered 2018-11-22 – 2018-11-23 (×2): 200 mg via ORAL
  Filled 2018-11-21 (×3): qty 2

## 2018-11-21 MED ORDER — SODIUM CHLORIDE 0.9 % IV SOLN
INTRAVENOUS | Status: DC
  Administered 2018-11-21: 14:00:00 via INTRAVENOUS

## 2018-11-21 MED ORDER — ACETAMINOPHEN 160 MG/5ML PO SOLN
1000.0000 mg | Freq: Four times a day (QID) | ORAL | Status: AC
  Filled 2018-11-21: qty 40.6

## 2018-11-21 MED ORDER — ONDANSETRON HCL 4 MG/2ML IJ SOLN
INTRAMUSCULAR | Status: DC | PRN
  Administered 2018-11-21: 4 mg via INTRAVENOUS

## 2018-11-21 MED ORDER — METOPROLOL TARTRATE 25 MG/10 ML ORAL SUSPENSION: 12.5000 mg | Freq: Two times a day (BID) | ORAL | Status: DC

## 2018-11-21 MED ORDER — SODIUM BICARBONATE 8.4 % IV SOLN
INTRAVENOUS | Status: AC
  Filled 2018-11-21: qty 50

## 2018-11-21 MED ORDER — ROCURONIUM BROMIDE 50 MG/5ML IV SOSY
PREFILLED_SYRINGE | INTRAVENOUS | Status: AC
  Filled 2018-11-21: qty 20

## 2018-11-21 MED ORDER — SODIUM BICARBONATE 8.4 % IV SOLN
100.0000 meq | Freq: Once | INTRAVENOUS | Status: AC
  Administered 2018-11-21: 100 meq via INTRAVENOUS

## 2018-11-21 MED ORDER — LACTATED RINGERS IV SOLN: 500.0000 mL | Freq: Once | INTRAVENOUS | Status: DC | PRN

## 2018-11-21 MED ORDER — PROTAMINE SULFATE 10 MG/ML IV SOLN
INTRAVENOUS | Status: DC | PRN
  Administered 2018-11-21: 50 mg via INTRAVENOUS
  Administered 2018-11-21: 20 mg via INTRAVENOUS
  Administered 2018-11-21 (×2): 50 mg via INTRAVENOUS
  Administered 2018-11-21: 20 mg via INTRAVENOUS

## 2018-11-21 MED ORDER — ARTIFICIAL TEARS OPHTHALMIC OINT
TOPICAL_OINTMENT | OPHTHALMIC | Status: AC
  Filled 2018-11-21: qty 3.5

## 2018-11-21 MED ORDER — LEVOFLOXACIN IN D5W 750 MG/150ML IV SOLN
750.0000 mg | INTRAVENOUS | Status: AC
  Administered 2018-11-22: 750 mg via INTRAVENOUS
  Filled 2018-11-21: qty 150

## 2018-11-21 MED ORDER — ORAL CARE MOUTH RINSE
15.0000 mL | OROMUCOSAL | Status: DC
  Administered 2018-11-21: 15 mL via OROMUCOSAL

## 2018-11-21 MED ORDER — VECURONIUM BROMIDE 10 MG IV SOLR
INTRAVENOUS | Status: DC | PRN
  Administered 2018-11-21 (×3): 5 mg via INTRAVENOUS

## 2018-11-21 MED ORDER — NEOSTIGMINE METHYLSULFATE 3 MG/3ML IV SOSY
PREFILLED_SYRINGE | INTRAVENOUS | Status: AC
  Filled 2018-11-21: qty 6

## 2018-11-21 MED ORDER — MORPHINE SULFATE (PF) 2 MG/ML IV SOLN
1.0000 mg | INTRAVENOUS | Status: DC | PRN
  Administered 2018-11-22 (×2): 4 mg via INTRAVENOUS
  Administered 2018-11-22: 2 mg via INTRAVENOUS
  Filled 2018-11-21: qty 1
  Filled 2018-11-21: qty 2
  Filled 2018-11-21 (×2): qty 1

## 2018-11-21 MED ORDER — ALBUMIN HUMAN 5 % IV SOLN
INTRAVENOUS | Status: DC | PRN
  Administered 2018-11-21 (×3): via INTRAVENOUS

## 2018-11-21 MED ORDER — THROMBIN 5000 UNITS EX SOLR
CUTANEOUS | Status: DC | PRN
  Administered 2018-11-21 (×3): 5000 [IU] via TOPICAL

## 2018-11-21 MED ORDER — MIDAZOLAM HCL 5 MG/5ML IJ SOLN
INTRAMUSCULAR | Status: DC | PRN
  Administered 2018-11-21: 1 mg via INTRAVENOUS
  Administered 2018-11-21 (×4): 2 mg via INTRAVENOUS
  Administered 2018-11-21: 1 mg via INTRAVENOUS
  Administered 2018-11-21: 2 mg via INTRAVENOUS

## 2018-11-21 MED ORDER — DEXMEDETOMIDINE HCL IN NACL 200 MCG/50ML IV SOLN
0.0000 ug/kg/h | INTRAVENOUS | Status: DC
  Administered 2018-11-21: 0.1 ug/kg/h via INTRAVENOUS
  Filled 2018-11-21: qty 50

## 2018-11-21 MED ORDER — PHENYLEPHRINE 40 MCG/ML (10ML) SYRINGE FOR IV PUSH (FOR BLOOD PRESSURE SUPPORT)
PREFILLED_SYRINGE | INTRAVENOUS | Status: AC
  Filled 2018-11-21: qty 30

## 2018-11-21 MED ORDER — FENTANYL CITRATE (PF) 250 MCG/5ML IJ SOLN
INTRAMUSCULAR | Status: AC
  Filled 2018-11-21: qty 25

## 2018-11-21 MED ORDER — SUCCINYLCHOLINE CHLORIDE 20 MG/ML IJ SOLN
INTRAMUSCULAR | Status: DC | PRN
  Administered 2018-11-21: 100 mg via INTRAVENOUS

## 2018-11-21 MED ORDER — PLASMA-LYTE 148 IV SOLN
INTRAVENOUS | Status: DC | PRN
  Administered 2018-11-21: 500 mL via INTRAVASCULAR

## 2018-11-21 MED ORDER — LACTATED RINGERS IV SOLN: INTRAVENOUS | Status: DC

## 2018-11-21 MED ORDER — PHENYLEPHRINE HCL-NACL 20-0.9 MG/250ML-% IV SOLN: 0.0000 ug/min | INTRAVENOUS | Status: DC

## 2018-11-21 MED ORDER — ACETAMINOPHEN 500 MG PO TABS
1000.0000 mg | ORAL_TABLET | Freq: Four times a day (QID) | ORAL | Status: AC
  Administered 2018-11-22 – 2018-11-26 (×16): 1000 mg via ORAL
  Filled 2018-11-21 (×18): qty 2

## 2018-11-21 MED ORDER — ORAL CARE MOUTH RINSE
15.0000 mL | Freq: Two times a day (BID) | OROMUCOSAL | Status: DC
  Administered 2018-11-22 – 2018-11-27 (×8): 15 mL via OROMUCOSAL

## 2018-11-21 MED ORDER — VECURONIUM BROMIDE 10 MG IV SOLR
INTRAVENOUS | Status: AC
  Filled 2018-11-21: qty 20

## 2018-11-21 MED ORDER — SODIUM CHLORIDE 0.9% FLUSH
3.0000 mL | Freq: Two times a day (BID) | INTRAVENOUS | Status: DC
  Administered 2018-11-22 – 2018-11-23 (×2): 3 mL via INTRAVENOUS

## 2018-11-21 MED ORDER — MIDAZOLAM HCL 2 MG/2ML IJ SOLN: 2.0000 mg | INTRAMUSCULAR | Status: DC | PRN

## 2018-11-21 MED ORDER — METOPROLOL TARTRATE 5 MG/5ML IV SOLN
2.5000 mg | INTRAVENOUS | Status: DC | PRN
  Administered 2018-11-21: 2.5 mg via INTRAVENOUS

## 2018-11-21 MED ORDER — PROPOFOL 10 MG/ML IV BOLUS
INTRAVENOUS | Status: AC
  Filled 2018-11-21: qty 20

## 2018-11-21 MED ORDER — MIDAZOLAM HCL 2 MG/2ML IJ SOLN
INTRAMUSCULAR | Status: AC
  Filled 2018-11-21: qty 2

## 2018-11-21 MED ORDER — VANCOMYCIN HCL IN DEXTROSE 1-5 GM/200ML-% IV SOLN
1000.0000 mg | Freq: Once | INTRAVENOUS | Status: AC
  Administered 2018-11-21: 1000 mg via INTRAVENOUS
  Filled 2018-11-21: qty 200

## 2018-11-21 MED ORDER — FENTANYL CITRATE (PF) 250 MCG/5ML IJ SOLN
INTRAMUSCULAR | Status: DC | PRN
  Administered 2018-11-21: 150 ug via INTRAVENOUS
  Administered 2018-11-21 (×2): 100 ug via INTRAVENOUS
  Administered 2018-11-21: 250 ug via INTRAVENOUS
  Administered 2018-11-21: 450 ug via INTRAVENOUS
  Administered 2018-11-21: 150 ug via INTRAVENOUS
  Administered 2018-11-21: 50 ug via INTRAVENOUS

## 2018-11-21 MED ORDER — ASPIRIN 81 MG PO CHEW: 324.0000 mg | CHEWABLE_TABLET | Freq: Every day | ORAL | Status: DC

## 2018-11-21 MED ORDER — PROTAMINE SULFATE 10 MG/ML IV SOLN
INTRAVENOUS | Status: AC
  Filled 2018-11-21: qty 5

## 2018-11-21 MED ORDER — LACTATED RINGERS IV SOLN
INTRAVENOUS | Status: DC | PRN
  Administered 2018-11-21 (×2): via INTRAVENOUS

## 2018-11-21 MED ORDER — METOPROLOL TARTRATE 12.5 MG HALF TABLET: 12.5000 mg | ORAL_TABLET | Freq: Two times a day (BID) | ORAL | Status: DC

## 2018-11-21 MED ORDER — THROMBIN 5000 UNITS EX SOLR
OROMUCOSAL | Status: DC | PRN
  Administered 2018-11-21 (×3): via TOPICAL

## 2018-11-21 MED ORDER — SODIUM CHLORIDE 0.9 % IV SOLN
250.0000 mL | INTRAVENOUS | Status: DC
  Administered 2018-11-22: 250 mL via INTRAVENOUS

## 2018-11-21 MED ORDER — ARTIFICIAL TEARS OPHTHALMIC OINT
TOPICAL_OINTMENT | OPHTHALMIC | Status: DC | PRN
  Administered 2018-11-21: 1 via OPHTHALMIC

## 2018-11-21 MED ORDER — POTASSIUM CHLORIDE 10 MEQ/50ML IV SOLN
10.0000 meq | INTRAVENOUS | Status: AC
  Administered 2018-11-21 (×3): 10 meq via INTRAVENOUS

## 2018-11-21 MED ORDER — ONDANSETRON HCL 4 MG/2ML IJ SOLN
4.0000 mg | Freq: Four times a day (QID) | INTRAMUSCULAR | Status: DC | PRN
  Administered 2018-11-23: 4 mg via INTRAVENOUS
  Filled 2018-11-21: qty 2

## 2018-11-21 MED ORDER — PROTAMINE SULFATE 10 MG/ML IV SOLN
INTRAVENOUS | Status: AC
  Filled 2018-11-21: qty 25

## 2018-11-21 MED ORDER — SUCCINYLCHOLINE CHLORIDE 200 MG/10ML IV SOSY
PREFILLED_SYRINGE | INTRAVENOUS | Status: AC
  Filled 2018-11-21: qty 10

## 2018-11-21 MED ORDER — EPHEDRINE SULFATE 50 MG/ML IJ SOLN
INTRAMUSCULAR | Status: DC | PRN
  Administered 2018-11-21 (×2): 10 mg via INTRAVENOUS

## 2018-11-21 MED ORDER — ALBUMIN HUMAN 5 % IV SOLN
250.0000 mL | INTRAVENOUS | Status: DC | PRN
  Administered 2018-11-21: 15:00:00 12.5 g via INTRAVENOUS

## 2018-11-21 MED ORDER — LACTATED RINGERS IV SOLN
INTRAVENOUS | Status: DC
  Administered 2018-11-21: 14:00:00 via INTRAVENOUS

## 2018-11-21 MED ORDER — CHLORHEXIDINE GLUCONATE CLOTH 2 % EX PADS
6.0000 | MEDICATED_PAD | Freq: Every day | CUTANEOUS | Status: DC
  Administered 2018-11-22 – 2018-11-23 (×2): 6 via TOPICAL

## 2018-11-21 MED ORDER — INSULIN REGULAR BOLUS VIA INFUSION
0.0000 [IU] | Freq: Three times a day (TID) | INTRAVENOUS | Status: DC
  Filled 2018-11-21: qty 10

## 2018-11-21 MED ORDER — 0.9 % SODIUM CHLORIDE (POUR BTL) OPTIME
TOPICAL | Status: DC | PRN
  Administered 2018-11-21: 09:00:00 6000 mL

## 2018-11-21 MED ORDER — GLYCOPYRROLATE PF 0.2 MG/ML IJ SOSY
PREFILLED_SYRINGE | INTRAMUSCULAR | Status: AC
  Filled 2018-11-21: qty 3

## 2018-11-21 MED ORDER — CHLORHEXIDINE GLUCONATE 0.12% ORAL RINSE (MEDLINE KIT): 15.0000 mL | Freq: Two times a day (BID) | OROMUCOSAL | Status: DC

## 2018-11-21 MED ORDER — THROMBIN 5000 UNITS EX SOLR
CUTANEOUS | Status: AC
  Filled 2018-11-21: qty 15000

## 2018-11-21 MED ORDER — HEPARIN SODIUM (PORCINE) 1000 UNIT/ML IJ SOLN
INTRAMUSCULAR | Status: DC | PRN
  Administered 2018-11-21: 27000 [IU] via INTRAVENOUS

## 2018-11-21 MED ORDER — LIDOCAINE 2% (20 MG/ML) 5 ML SYRINGE
INTRAMUSCULAR | Status: AC
  Filled 2018-11-21: qty 5

## 2018-11-21 MED ORDER — SODIUM CHLORIDE 0.9% FLUSH: 10.0000 mL | INTRAVENOUS | Status: DC | PRN

## 2018-11-21 MED ORDER — MIDAZOLAM HCL (PF) 10 MG/2ML IJ SOLN
INTRAMUSCULAR | Status: AC
  Filled 2018-11-21: qty 2

## 2018-11-21 MED ORDER — HEMOSTATIC AGENTS (NO CHARGE) OPTIME
TOPICAL | Status: DC | PRN
  Administered 2018-11-21: 1 via TOPICAL

## 2018-11-21 MED ORDER — ACETAMINOPHEN 650 MG RE SUPP: 650.0000 mg | Freq: Once | RECTAL | Status: AC

## 2018-11-21 MED ORDER — SODIUM CHLORIDE 0.9% FLUSH
10.0000 mL | Freq: Two times a day (BID) | INTRAVENOUS | Status: DC
  Administered 2018-11-21 – 2018-11-23 (×4): 10 mL

## 2018-11-21 MED ORDER — PROPOFOL 10 MG/ML IV BOLUS
INTRAVENOUS | Status: DC | PRN
  Administered 2018-11-21: 50 mg via INTRAVENOUS

## 2018-11-21 MED ORDER — LACTATED RINGERS IV SOLN
INTRAVENOUS | Status: DC | PRN
  Administered 2018-11-21: 07:00:00 via INTRAVENOUS

## 2018-11-21 MED ORDER — FAMOTIDINE IN NACL 20-0.9 MG/50ML-% IV SOLN
20.0000 mg | Freq: Two times a day (BID) | INTRAVENOUS | Status: AC
  Administered 2018-11-21: 20 mg via INTRAVENOUS
  Filled 2018-11-21 (×2): qty 50

## 2018-11-21 MED ORDER — SODIUM CHLORIDE 0.9 % IV SOLN
INTRAVENOUS | Status: DC | PRN
  Administered 2018-11-21: 25 ug/min via INTRAVENOUS

## 2018-11-21 MED ORDER — MAGNESIUM SULFATE 4 GM/100ML IV SOLN
4.0000 g | Freq: Once | INTRAVENOUS | Status: AC
  Administered 2018-11-21: 4 g via INTRAVENOUS
  Filled 2018-11-21: qty 100

## 2018-11-21 MED ORDER — SODIUM CHLORIDE 0.9% FLUSH: 3.0000 mL | INTRAVENOUS | Status: DC | PRN

## 2018-11-21 MED ORDER — ASPIRIN EC 325 MG PO TBEC
325.0000 mg | DELAYED_RELEASE_TABLET | Freq: Every day | ORAL | Status: DC
  Administered 2018-11-22 – 2018-11-27 (×6): 325 mg via ORAL
  Filled 2018-11-21 (×6): qty 1

## 2018-11-21 MED ORDER — BISACODYL 5 MG PO TBEC
10.0000 mg | DELAYED_RELEASE_TABLET | Freq: Every day | ORAL | Status: DC
  Administered 2018-11-22 – 2018-11-23 (×2): 10 mg via ORAL
  Filled 2018-11-21 (×2): qty 2

## 2018-11-21 MED ORDER — CHLORHEXIDINE GLUCONATE 0.12 % MT SOLN
15.0000 mL | OROMUCOSAL | Status: AC
  Administered 2018-11-21: 15 mL via OROMUCOSAL

## 2018-11-21 MED ORDER — SODIUM CHLORIDE 0.45 % IV SOLN: INTRAVENOUS | Status: DC | PRN

## 2018-11-21 MED ORDER — BISACODYL 10 MG RE SUPP: 10.0000 mg | Freq: Every day | RECTAL | Status: DC

## 2018-11-21 MED ORDER — PANTOPRAZOLE SODIUM 40 MG PO TBEC
40.0000 mg | DELAYED_RELEASE_TABLET | Freq: Every day | ORAL | Status: DC
  Administered 2018-11-23 – 2018-11-27 (×5): 40 mg via ORAL
  Filled 2018-11-21 (×5): qty 1

## 2018-11-21 MED ORDER — ACETAMINOPHEN 160 MG/5ML PO SOLN
650.0000 mg | Freq: Once | ORAL | Status: AC
  Administered 2018-11-21: 650 mg
  Filled 2018-11-21: qty 20.3

## 2018-11-21 MED ORDER — NITROGLYCERIN IN D5W 200-5 MCG/ML-% IV SOLN
0.0000 ug/min | INTRAVENOUS | Status: DC
  Administered 2018-11-21: 17:00:00 5 ug/min via INTRAVENOUS

## 2018-11-21 MED ORDER — OXYCODONE HCL 5 MG PO TABS
5.0000 mg | ORAL_TABLET | ORAL | Status: DC | PRN
  Administered 2018-11-22 (×2): 10 mg via ORAL
  Administered 2018-11-25: 5 mg via ORAL
  Filled 2018-11-21 (×2): qty 2
  Filled 2018-11-21: qty 1
  Filled 2018-11-21: qty 2

## 2018-11-21 MED ORDER — ONDANSETRON HCL 4 MG/2ML IJ SOLN
INTRAMUSCULAR | Status: AC
  Filled 2018-11-21: qty 4

## 2018-11-21 MED ORDER — INSULIN REGULAR(HUMAN) IN NACL 100-0.9 UT/100ML-% IV SOLN: INTRAVENOUS | Status: DC

## 2018-11-21 SURGICAL SUPPLY — 103 items
BAG DECANTER FOR FLEXI CONT (MISCELLANEOUS) ×4 IMPLANT
BANDAGE ACE 4X5 VEL STRL LF (GAUZE/BANDAGES/DRESSINGS) ×4 IMPLANT
BANDAGE ACE 6X5 VEL STRL LF (GAUZE/BANDAGES/DRESSINGS) ×4 IMPLANT
BANDAGE ELASTIC 4 VELCRO ST LF (GAUZE/BANDAGES/DRESSINGS) ×4 IMPLANT
BANDAGE ELASTIC 6 VELCRO ST LF (GAUZE/BANDAGES/DRESSINGS) ×4 IMPLANT
BASKET HEART  (ORDER IN 25'S) (MISCELLANEOUS) ×1
BASKET HEART (ORDER IN 25'S) (MISCELLANEOUS) ×1
BASKET HEART (ORDER IN 25S) (MISCELLANEOUS) ×2 IMPLANT
BLADE STERNUM SYSTEM 6 (BLADE) ×4 IMPLANT
BNDG GAUZE ELAST 4 BULKY (GAUZE/BANDAGES/DRESSINGS) ×6 IMPLANT
CANISTER SUCT 3000ML PPV (MISCELLANEOUS) ×4 IMPLANT
CATH ROBINSON RED A/P 18FR (CATHETERS) ×8 IMPLANT
CATH THORACIC 28FR (CATHETERS) ×4 IMPLANT
CATH THORACIC 36FR (CATHETERS) ×4 IMPLANT
CATH THORACIC 36FR RT ANG (CATHETERS) ×4 IMPLANT
CLIP VESOCCLUDE MED 24/CT (CLIP) ×4 IMPLANT
CLIP VESOCCLUDE SM WIDE 24/CT (CLIP) ×8 IMPLANT
COVER WAND RF STERILE (DRAPES) ×2 IMPLANT
CRADLE DONUT ADULT HEAD (MISCELLANEOUS) ×4 IMPLANT
DRAPE CARDIOVASCULAR INCISE (DRAPES) ×4
DRAPE SLUSH/WARMER DISC (DRAPES) ×4 IMPLANT
DRAPE SRG 135X102X78XABS (DRAPES) ×2 IMPLANT
DRSG COVADERM 4X14 (GAUZE/BANDAGES/DRESSINGS) ×4 IMPLANT
ELECT CAUTERY BLADE 6.4 (BLADE) ×4 IMPLANT
ELECT REM PT RETURN 9FT ADLT (ELECTROSURGICAL) ×8
ELECTRODE REM PT RTRN 9FT ADLT (ELECTROSURGICAL) ×4 IMPLANT
FELT TEFLON 1X6 (MISCELLANEOUS) ×8 IMPLANT
GAUZE SPONGE 4X4 12PLY STRL (GAUZE/BANDAGES/DRESSINGS) ×8 IMPLANT
GAUZE SPONGE 4X4 12PLY STRL LF (GAUZE/BANDAGES/DRESSINGS) ×6 IMPLANT
GLOVE BIO SURGEON STRL SZ 6 (GLOVE) IMPLANT
GLOVE BIO SURGEON STRL SZ 6.5 (GLOVE) ×2 IMPLANT
GLOVE BIO SURGEON STRL SZ7 (GLOVE) IMPLANT
GLOVE BIO SURGEON STRL SZ7.5 (GLOVE) ×4 IMPLANT
GLOVE BIO SURGEONS STRL SZ 6.5 (GLOVE) ×2
GLOVE BIOGEL PI IND STRL 6 (GLOVE) IMPLANT
GLOVE BIOGEL PI IND STRL 6.5 (GLOVE) IMPLANT
GLOVE BIOGEL PI IND STRL 7.0 (GLOVE) IMPLANT
GLOVE BIOGEL PI INDICATOR 6 (GLOVE) ×8
GLOVE BIOGEL PI INDICATOR 6.5 (GLOVE) ×4
GLOVE BIOGEL PI INDICATOR 7.0 (GLOVE) ×2
GLOVE EUDERMIC 7 POWDERFREE (GLOVE) ×8 IMPLANT
GLOVE ORTHO TXT STRL SZ7.5 (GLOVE) IMPLANT
GOWN STRL REUS W/ TWL LRG LVL3 (GOWN DISPOSABLE) ×8 IMPLANT
GOWN STRL REUS W/ TWL XL LVL3 (GOWN DISPOSABLE) ×2 IMPLANT
GOWN STRL REUS W/TWL LRG LVL3 (GOWN DISPOSABLE) ×16
GOWN STRL REUS W/TWL XL LVL3 (GOWN DISPOSABLE) ×4
HEMOSTAT POWDER SURGIFOAM 1G (HEMOSTASIS) ×12 IMPLANT
HEMOSTAT SURGICEL 2X14 (HEMOSTASIS) ×4 IMPLANT
INSERT FOGARTY 61MM (MISCELLANEOUS) IMPLANT
INSERT FOGARTY XLG (MISCELLANEOUS) IMPLANT
KIT BASIN OR (CUSTOM PROCEDURE TRAY) ×4 IMPLANT
KIT CATH CPB BARTLE (MISCELLANEOUS) ×4 IMPLANT
KIT SUCTION CATH 14FR (SUCTIONS) ×4 IMPLANT
KIT TURNOVER KIT B (KITS) ×4 IMPLANT
KIT VASOVIEW HEMOPRO 2 VH 4000 (KITS) ×4 IMPLANT
NS IRRIG 1000ML POUR BTL (IV SOLUTION) ×22 IMPLANT
PACK E OPEN HEART (SUTURE) ×4 IMPLANT
PACK OPEN HEART (CUSTOM PROCEDURE TRAY) ×4 IMPLANT
PAD ARMBOARD 7.5X6 YLW CONV (MISCELLANEOUS) ×8 IMPLANT
PAD ELECT DEFIB RADIOL ZOLL (MISCELLANEOUS) ×4 IMPLANT
PENCIL BUTTON HOLSTER BLD 10FT (ELECTRODE) ×4 IMPLANT
PUNCH AORTIC ROTATE 4.0MM (MISCELLANEOUS) IMPLANT
PUNCH AORTIC ROTATE 4.5MM 8IN (MISCELLANEOUS) ×4 IMPLANT
PUNCH AORTIC ROTATE 5MM 8IN (MISCELLANEOUS) IMPLANT
SET CARDIOPLEGIA MPS 5001102 (MISCELLANEOUS) ×2 IMPLANT
SPONGE INTESTINAL PEANUT (DISPOSABLE) IMPLANT
SPONGE LAP 18X18 RF (DISPOSABLE) ×2 IMPLANT
SPONGE LAP 4X18 RFD (DISPOSABLE) ×2 IMPLANT
SUT BONE WAX W31G (SUTURE) ×4 IMPLANT
SUT MNCRL AB 4-0 PS2 18 (SUTURE) IMPLANT
SUT PROLENE 3 0 SH DA (SUTURE) IMPLANT
SUT PROLENE 3 0 SH1 36 (SUTURE) ×4 IMPLANT
SUT PROLENE 4 0 RB 1 (SUTURE)
SUT PROLENE 4 0 SH DA (SUTURE) IMPLANT
SUT PROLENE 4-0 RB1 .5 CRCL 36 (SUTURE) IMPLANT
SUT PROLENE 5 0 C 1 36 (SUTURE) IMPLANT
SUT PROLENE 6 0 C 1 30 (SUTURE) ×6 IMPLANT
SUT PROLENE 7 0 BV 1 (SUTURE) ×4 IMPLANT
SUT PROLENE 7 0 BV1 MDA (SUTURE) ×4 IMPLANT
SUT PROLENE 8 0 BV175 6 (SUTURE) ×2 IMPLANT
SUT SILK  1 MH (SUTURE)
SUT SILK 1 MH (SUTURE) IMPLANT
SUT SILK 2 0 SH (SUTURE) IMPLANT
SUT STEEL STERNAL CCS#1 18IN (SUTURE) ×4 IMPLANT
SUT STEEL SZ 6 DBL 3X14 BALL (SUTURE) IMPLANT
SUT VIC AB 1 CTX 36 (SUTURE) ×12
SUT VIC AB 1 CTX36XBRD ANBCTR (SUTURE) ×4 IMPLANT
SUT VIC AB 2-0 CT1 27 (SUTURE) ×8
SUT VIC AB 2-0 CT1 TAPERPNT 27 (SUTURE) IMPLANT
SUT VIC AB 2-0 CTX 27 (SUTURE) IMPLANT
SUT VIC AB 3-0 SH 27 (SUTURE)
SUT VIC AB 3-0 SH 27X BRD (SUTURE) IMPLANT
SUT VIC AB 3-0 X1 27 (SUTURE) ×4 IMPLANT
SUT VICRYL 4-0 PS2 18IN ABS (SUTURE) IMPLANT
SYSTEM SAHARA CHEST DRAIN ATS (WOUND CARE) ×4 IMPLANT
TAPE CLOTH SURG 4X10 WHT LF (GAUZE/BANDAGES/DRESSINGS) ×6 IMPLANT
TAPE PAPER 2X10 WHT MICROPORE (GAUZE/BANDAGES/DRESSINGS) ×2 IMPLANT
TOWEL GREEN STERILE (TOWEL DISPOSABLE) ×4 IMPLANT
TOWEL GREEN STERILE FF (TOWEL DISPOSABLE) ×4 IMPLANT
TRAY FOLEY SLVR 16FR TEMP STAT (SET/KITS/TRAYS/PACK) ×4 IMPLANT
TUBING LAP HI FLOW INSUFFLATIO (TUBING) ×4 IMPLANT
UNDERPAD 30X30 (UNDERPADS AND DIAPERS) ×4 IMPLANT
WATER STERILE IRR 1000ML POUR (IV SOLUTION) ×8 IMPLANT

## 2018-11-21 NOTE — Op Note (Signed)
CARDIOVASCULAR SURGERY OPERATIVE NOTE  11/21/2018  Surgeon:  Gaye Pollack, MD  First Assistant: Jadene Pierini,  PA-C   Preoperative Diagnosis:  Severe multi-vessel coronary artery disease   Postoperative Diagnosis:  Same   Procedure:  1. Median Sternotomy 2. Extracorporeal circulation 3.   Coronary artery bypass grafting x 5   Left internal mammary graft to the LAD  SVG to diagonal  Sequential SVG to OM2 and PDA (LCX)  SVG to OM1 4.   Endoscopic vein harvest from the left leg   Anesthesia:  General Endotracheal   Clinical History/Surgical Indication:  This 73 year old gentleman has severe multivessel coronary disease s/p PCI with stents at [facility] in the late 1990's and presens with a non-ST segment elevation MI.  Echocardiogram shows mild anterior septal, anterolateral, and apical wall motion abnormality with an overall normal left ventricular ejection fraction of 50 to 55%.  He is now stable on heparin and nitroglycerin.  I agree that coronary bypass graft surgery is the best treatment for this patient with diabetes and multivessel disease. I discussed the operative procedure with the patient including alternatives, benefits and risks; including but not limited to bleeding, blood transfusion, infection, stroke, myocardial infarction, graft failure, heart block requiring a permanent pacemaker, organ dysfunction, and death.  Keith Lucero. understands and agrees to proceed.  Preparation:  The patient was seen in the preoperative holding area and the correct patient, correct operation were confirmed with the patient after reviewing the medical record and catheterization. The consent was signed by me. Preoperative antibiotics were given. A pulmonary arterial line and radial arterial line were placed by the anesthesia team. The patient was taken back to the operating room and  positioned supine on the operating room table. After being placed under general endotracheal anesthesia by the anesthesia team a foley catheter was placed. The neck, chest, abdomen, and both legs were prepped with betadine soap and solution and draped in the usual sterile manner. A surgical time-out was taken and the correct patient and operative procedure were confirmed with the nursing and anesthesia staff.   Cardiopulmonary Bypass:  A median sternotomy was performed. The pericardium was opened in the midline. Right ventricular function appeared normal. The ascending aorta was of normal size and had no palpable plaque. There were no contraindications to aortic cannulation or cross-clamping. The patient was fully systemically heparinized and the ACT was maintained > 400 sec. The proximal aortic arch was cannulated with a 20 F aortic cannula for arterial inflow. Venous cannulation was performed via the right atrial appendage using a two-staged venous cannula. An antegrade cardioplegia/vent cannula was inserted into the mid-ascending aorta. Aortic occlusion was performed with a single cross-clamp. Systemic cooling to 32 degrees Centigrade and topical cooling of the heart with iced saline were used. Hyperkalemic antegrade cold blood cardioplegia was used to induce diastolic arrest and was then given at about 20 minute intervals throughout the period of arrest to maintain myocardial temperature at or below 10 degrees centigrade. A temperature probe was inserted into the interventricular septum and an insulating pad was placed in the pericardium.   Left internal mammary harvest:  The left side of the sternum was retracted using the Rultract retractor. The left internal mammary artery was harvested as a pedicle graft. All side branches were clipped. It was a medium-sized vessel of good quality with excellent blood flow. It was ligated distally and divided. It was sprayed with topical papaverine solution to  prevent vasospasm.   Endoscopic vein harvest:  We initially examined the right greater saphenous vein medial to the right knee and it was a small dual system. Therefore it was not harvested.  The left greater saphenous vein was harvested endoscopically through a 2 cm incision medial to the left knee. It was harvested from the upper thigh to below the knee. It was a medium-sized vein of good quality. The side branches were all ligated with 4-0 silk ties.    Coronary arteries:  The coronary arteries were examined.   LAD:  Diffusely diseased extending to the distal vessel. The diagonal was diffusely diseased but one branch was graftable.  LCX:  OM1 intramyocardial and located in the mid portion where it was a medium sized vessel with mild disease. The OM2 was a medium caliber vessel with no distal disease. The LCX terminated as a branching PDA that had no distal disease.  RCA:  Small, non-dominant.   Grafts:  1. LIMA to the LAD: 1.75 mm distally. It was sewn end to side using 8-0 prolene continuous suture. 2. SVG to diagonal:  1.6 mm. It was sewn end to side using 7-0 prolene continuous suture. 3. Sequential SVG to OM2:  1.75 mm. It was sewn sequential side to side using 7-0 prolene continuous suture. 4. Sequential SVG to PDA (LCX):  1.75 mm. It was sewn sequential end        to side using 7-0 prolene continuous suture. 5.   SVG to OM1:  1.62mm. It was sewn end to side using 7-0 prolene continuous suture.  The proximal vein graft anastomoses were performed to the mid-ascending aorta using continuous 6-0 prolene suture. Graft markers were placed around the proximal anastomoses.   Completion:  The patient was rewarmed to 37 degrees Centigrade. The clamp was removed from the LIMA pedicle and there was rapid warming of the septum and return of ventricular fibrillation. The crossclamp was removed with a time of 98 minutes. There was spontaneous return of sinus rhythm. The distal and  proximal anastomoses were checked for hemostasis. The position of the grafts was satisfactory. Two temporary epicardial pacing wires were placed on the right atrium and two on the right ventricle. The patient was weaned from CPB without difficulty on no inotropes. CPB time was 114 minutes. Cardiac output was 5 LPM. TEE showed normal LV systolic function. Heparin was fully reversed with protamine and the aortic and venous cannulas removed. Hemostasis was achieved. Mediastinal and left pleural drainage tubes were placed. The sternum was closed with #6 stainless steel wires. The fascia was closed with continuous # 1 vicryl suture. The subcutaneous tissue was closed with 2-0 vicryl continuous suture. The skin was closed with 3-0 vicryl subcuticular suture. All sponge, needle, and instrument counts were reported correct at the end of the case. Dry sterile dressings were placed over the incisions and around the chest tubes which were connected to pleurevac suction. The patient was then transported to the surgical intensive care unit in stable condition.

## 2018-11-21 NOTE — Anesthesia Procedure Notes (Signed)
Central Venous Catheter Insertion Performed by: Suzette Battiest, MD, anesthesiologist Start/End4/23/2020 7:05 AM, 11/21/2018 7:15 AM Patient location: Pre-op. Preanesthetic checklist: patient identified, IV checked, site marked, risks and benefits discussed, surgical consent, monitors and equipment checked, pre-op evaluation, timeout performed and anesthesia consent Hand hygiene performed  and maximum sterile barriers used  PA cath was placed.Swan type:thermodilution PA Cath depth:42 Procedure performed without using ultrasound guided technique. Attempts: 1 Patient tolerated the procedure well with no immediate complications.

## 2018-11-21 NOTE — Procedures (Signed)
Extubation Procedure Note  Patient Details:   Name: Keith Lucero. DOB: [birth year] MRN: [id number]   Airway Documentation:    Vent end date: 11/21/18 Vent end time: 1808   Evaluation  O2 sats: stable throughout Complications: No apparent complications Patient did tolerate procedure well. Bilateral Breath Sounds: Clear, Diminished   Yes   Patient extubated per order and protocol to 4L [place] with no apparent complications. Positive cuff leak was noted prior to extubation. Patient was able to achieve VC of .60L and NIF of only -15. MD Dr. Cyndia Bent was called and ordered to proceed with extubation. Patient is able to weakly speak. Vitals are stable. RT will continue to monitor.   Jesusita Jocelyn Clyda Greener 11/21/2018, 6:13 PM

## 2018-11-21 NOTE — Progress Notes (Signed)
TCTS BRIEF SICU PROGRESS NOTE  Day of Surgery  S/P Procedure(s) (LRB): CORONARY ARTERY BYPASS GRAFTING (CABG) TIMES FIVE USING LEFT INTERNAL MAMMARY ARTERY AND LEFT SAPHENOUS VEIN HARVESTED ENDOSCOPICALLY (N/A) TRANSESOPHAGEAL ECHOCARDIOGRAM (TEE) (N/A)   Just extubated Sleepy but follows commands NSR w/ stable hemodynamics O2 sats 97% Chest tube output low UOP > 150 mL/hr Labs okay  Plan: Continue routine early postop  Rexene Alberts, MD 11/21/2018 6:45 PM

## 2018-11-21 NOTE — Progress Notes (Signed)
RT NOTE: Rapid weaning protocol 

## 2018-11-21 NOTE — Transfer of Care (Signed)
Immediate Anesthesia Transfer of Care Note  Patient: Keith Lucero.  Procedure(s) Performed: CORONARY ARTERY BYPASS GRAFTING (CABG) TIMES FIVE USING LEFT INTERNAL MAMMARY ARTERY AND LEFT SAPHENOUS VEIN HARVESTED ENDOSCOPICALLY (N/A Chest) TRANSESOPHAGEAL ECHOCARDIOGRAM (TEE) (N/A )  Patient Location: SICU  Anesthesia Type:General  Level of Consciousness: Patient remains intubated per anesthesia plan  Airway & Oxygen Therapy: Patient remains intubated per anesthesia plan and Patient placed on Ventilator (see vital sign flow sheet for setting)  Post-op Assessment: Report given to RN and Post -op Vital signs reviewed and stable  Post vital signs: Reviewed and stable  Last Vitals:  Vitals Value Taken Time  BP    Temp    Pulse 80 11/21/2018  1:06 PM  Resp 13 11/21/2018  1:06 PM  SpO2 93 % 11/21/2018  1:06 PM  Vitals shown include unvalidated device data.  Last Pain:  Vitals:   11/21/18 0508  TempSrc: Oral  PainSc:          Complications: No apparent anesthesia complications

## 2018-11-21 NOTE — Anesthesia Postprocedure Evaluation (Signed)
Anesthesia Post Note  Patient: Keith Lucero.  Procedure(s) Performed: CORONARY ARTERY BYPASS GRAFTING (CABG) TIMES FIVE USING LEFT INTERNAL MAMMARY ARTERY AND LEFT SAPHENOUS VEIN HARVESTED ENDOSCOPICALLY (N/A Chest) TRANSESOPHAGEAL ECHOCARDIOGRAM (TEE) (N/A )     Patient location during evaluation: SICU Anesthesia Type: General Level of consciousness: sedated Pain management: pain level controlled Vital Signs Assessment: post-procedure vital signs reviewed and stable Respiratory status: patient remains intubated per anesthesia plan Cardiovascular status: stable Postop Assessment: no apparent nausea or vomiting Anesthetic complications: no    Last Vitals:  Vitals:   11/21/18 1300 11/21/18 1302  BP:  (!) 106/46  Pulse:  80  Resp:  12  Temp:    SpO2: 93% 96%    Last Pain:  Vitals:   11/21/18 0508  TempSrc: Oral  PainSc:                  Tiajuana Amass

## 2018-11-21 NOTE — Anesthesia Procedure Notes (Signed)
Procedure Name: Intubation Date/Time: 11/21/2018 7:40 AM Performed by: Neldon Newport, CRNA Pre-anesthesia Checklist: Timeout performed, Patient being monitored, Suction available, Emergency Drugs available and Patient identified Patient Re-evaluated:Patient Re-evaluated prior to induction Oxygen Delivery Method: Circle system utilized Preoxygenation: Pre-oxygenation with 100% oxygen Induction Type: IV induction and Rapid sequence Laryngoscope Size: Mac and 4 Grade View: Grade I Tube type: Oral Tube size: 8.0 mm Number of attempts: 1 Placement Confirmation: breath sounds checked- equal and bilateral,  positive ETCO2 and ETT inserted through vocal cords under direct vision Secured at: 24 cm Tube secured with: Tape Dental Injury: Teeth and Oropharynx as per pre-operative assessment

## 2018-11-21 NOTE — Brief Op Note (Signed)
11/19/2018 - 11/21/2018  11:44 AM  PATIENT:  Keith Lucero.  73 y.o. male  PRE-OPERATIVE DIAGNOSIS:  CAD  POST-OPERATIVE DIAGNOSIS:  CAD  PROCEDURE:  Procedure(s): CORONARY ARTERY BYPASS GRAFTING (CABG) TIMES FIVE USING LEFT INTERNAL MAMMARY ARTERY AND LEFT SAPHENOUS VEIN HARVESTED ENDOSCOPICALLY (N/A) TRANSESOPHAGEAL ECHOCARDIOGRAM (TEE) (N/A) LIMA-LAD SEQ SVG-OM2-CX SVG-DIAG SVG-OM1  SURGEON:  Surgeon(s) and Role:    * Bartle, Fernande Boyden, MD - Primary  PHYSICIAN ASSISTANT: WAYNE GOLD PA-C  ANESTHESIA:   general  EBL:  1000 mL   BLOOD ADMINISTERED:none  DRAINS: ROUTINE PLEURAL AND PERICARDIAL DRAINS   LOCAL MEDICATIONS USED:  NONE  SPECIMEN:  No Specimen  DISPOSITION OF SPECIMEN:  N/A  COUNTS:  YES  TOURNIQUET:  * No tourniquets in log *  DICTATION: .Dragon Dictation  PLAN OF CARE: Admit to inpatient   PATIENT DISPOSITION:  ICU - intubated and hemodynamically stable.   Delay start of Pharmacological VTE agent (>24hrs) due to surgical blood loss or risk of bleeding: yes  COMPLICATIONS: NO KNOWN

## 2018-11-21 NOTE — Anesthesia Procedure Notes (Signed)
Central Venous Catheter Insertion Performed by: Suzette Battiest, MD, anesthesiologist Start/End4/23/2020 7:05 AM, 11/21/2018 7:15 AM Patient location: Pre-op. Preanesthetic checklist: patient identified, IV checked, site marked, risks and benefits discussed, surgical consent, monitors and equipment checked, pre-op evaluation, timeout performed and anesthesia consent Position: Trendelenburg Lidocaine 1% used for infiltration and patient sedated Hand hygiene performed , maximum sterile barriers used  and Seldinger technique used Catheter size: 8.5 Fr Total catheter length 10. Central line was placed.Sheath introducer Swan type:thermodilution PA Cath depth:50 Procedure performed using ultrasound guided technique. Ultrasound Notes:anatomy identified, needle tip was noted to be adjacent to the nerve/plexus identified, no ultrasound evidence of intravascular and/or intraneural injection and image(s) printed for medical record Attempts: 1 Following insertion, line sutured, dressing applied and Biopatch. Post procedure assessment: blood return through all ports, free fluid flow and no air  Patient tolerated the procedure well with no immediate complications.

## 2018-11-21 NOTE — Anesthesia Procedure Notes (Signed)
Arterial Line Insertion Start/End4/23/2020 6:50 AM, 11/21/2018 7:08 AM Performed by: Neldon Newport, CRNA, CRNA  Patient location: Pre-op. Preanesthetic checklist: patient identified, IV checked, site marked, risks and benefits discussed, surgical consent and monitors and equipment checked Right, radial was placed Catheter size: 20 G Hand hygiene performed  and maximum sterile barriers used   Attempts: 2 Procedure performed without using ultrasound guided technique. Following insertion, dressing applied. Post procedure assessment: normal and unchanged  Patient tolerated the procedure well with no immediate complications.

## 2018-11-22 ENCOUNTER — Inpatient Hospital Stay (HOSPITAL_COMMUNITY): Payer: PPO

## 2018-11-22 ENCOUNTER — Encounter (HOSPITAL_COMMUNITY): Payer: Self-pay | Admitting: Surgery

## 2018-11-22 LAB — GLUCOSE, CAPILLARY
Glucose-Capillary: 109 mg/dL — ABNORMAL HIGH (ref 70–99)
Glucose-Capillary: 115 mg/dL — ABNORMAL HIGH (ref 70–99)
Glucose-Capillary: 126 mg/dL — ABNORMAL HIGH (ref 70–99)
Glucose-Capillary: 126 mg/dL — ABNORMAL HIGH (ref 70–99)
Glucose-Capillary: 127 mg/dL — ABNORMAL HIGH (ref 70–99)
Glucose-Capillary: 130 mg/dL — ABNORMAL HIGH (ref 70–99)
Glucose-Capillary: 134 mg/dL — ABNORMAL HIGH (ref 70–99)
Glucose-Capillary: 137 mg/dL — ABNORMAL HIGH (ref 70–99)
Glucose-Capillary: 144 mg/dL — ABNORMAL HIGH (ref 70–99)
Glucose-Capillary: 147 mg/dL — ABNORMAL HIGH (ref 70–99)
Glucose-Capillary: 150 mg/dL — ABNORMAL HIGH (ref 70–99)
Glucose-Capillary: 152 mg/dL — ABNORMAL HIGH (ref 70–99)
Glucose-Capillary: 157 mg/dL — ABNORMAL HIGH (ref 70–99)
Glucose-Capillary: 175 mg/dL — ABNORMAL HIGH (ref 70–99)
Glucose-Capillary: 86 mg/dL (ref 70–99)
Glucose-Capillary: 99 mg/dL (ref 70–99)

## 2018-11-22 LAB — BASIC METABOLIC PANEL
Anion gap: 12 (ref 5–15)
Anion gap: 9 (ref 5–15)
BUN: 11 mg/dL (ref 8–23)
BUN: 16 mg/dL (ref 8–23)
CO2: 18 mmol/L — ABNORMAL LOW (ref 22–32)
CO2: 19 mmol/L — ABNORMAL LOW (ref 22–32)
Calcium: 7.8 mg/dL — ABNORMAL LOW (ref 8.9–10.3)
Calcium: 7.8 mg/dL — ABNORMAL LOW (ref 8.9–10.3)
Chloride: 108 mmol/L (ref 98–111)
Chloride: 108 mmol/L (ref 98–111)
Creatinine, Ser: 0.98 mg/dL (ref 0.61–1.24)
Creatinine, Ser: 0.94 mg/dL (ref 0.61–1.24)
GFR calc Af Amer: >60 mL/min (ref >60)
GFR calc Af Amer: >60 mL/min (ref >60)
GFR calc non Af Amer: >60 mL/min (ref >60)
GFR calc non Af Amer: >60 mL/min (ref >60)
Glucose, Bld: 186 mg/dL — ABNORMAL HIGH (ref 70–99)
Glucose, Bld: 164 mg/dL — ABNORMAL HIGH (ref 70–99)
Potassium: 4.4 mmol/L (ref 3.5–5.1)
Potassium: 4.4 mmol/L (ref 3.5–5.1)
Sodium: 138 mmol/L (ref 135–145)
Sodium: 136 mmol/L (ref 135–145)

## 2018-11-22 LAB — CBC
HCT: 34.6 % — ABNORMAL LOW (ref 39.0–52.0)
HCT: 35.3 % — ABNORMAL LOW (ref 39.0–52.0)
Hemoglobin: 11.7 g/dL — ABNORMAL LOW (ref 13.0–17.0)
Hemoglobin: 11.9 g/dL — ABNORMAL LOW (ref 13.0–17.0)
MCH: 30.2 pg (ref 26.0–34.0)
MCH: 30 pg (ref 26.0–34.0)
MCHC: 33.8 g/dL (ref 30.0–36.0)
MCHC: 33.7 g/dL (ref 30.0–36.0)
MCV: 89.2 fL (ref 80.0–100.0)
MCV: 88.9 fL (ref 80.0–100.0)
Platelets: 85 10*3/uL — ABNORMAL LOW (ref 150–400)
Platelets: 111 10*3/uL — ABNORMAL LOW (ref 150–400)
RBC: 3.88 MIL/uL — ABNORMAL LOW (ref 4.22–5.81)
RBC: 3.97 MIL/uL — ABNORMAL LOW (ref 4.22–5.81)
RDW: 12.7 % (ref 11.5–15.5)
RDW: 12.8 % (ref 11.5–15.5)
WBC: 15 10*3/uL — ABNORMAL HIGH (ref 4.0–10.5)
WBC: 16.2 10*3/uL — ABNORMAL HIGH (ref 4.0–10.5)
nRBC: 0 % (ref 0.0–0.2)
nRBC: 0 % (ref 0.0–0.2)

## 2018-11-22 LAB — POCT I-STAT 4, (NA,K, GLUC, HGB,HCT)
Glucose, Bld: 136 mg/dL — ABNORMAL HIGH (ref 70–99)
Glucose, Bld: 104 mg/dL — ABNORMAL HIGH (ref 70–99)
Glucose, Bld: 124 mg/dL — ABNORMAL HIGH (ref 70–99)
Glucose, Bld: 152 mg/dL — ABNORMAL HIGH (ref 70–99)
Glucose, Bld: 156 mg/dL — ABNORMAL HIGH (ref 70–99)
HCT: 35 % — ABNORMAL LOW (ref 39.0–52.0)
HCT: 25 % — ABNORMAL LOW (ref 39.0–52.0)
HCT: 33 % — ABNORMAL LOW (ref 39.0–52.0)
HCT: 26 % — ABNORMAL LOW (ref 39.0–52.0)
HCT: 28 % — ABNORMAL LOW (ref 39.0–52.0)
Hemoglobin: 11.9 g/dL — ABNORMAL LOW (ref 13.0–17.0)
Hemoglobin: 11.2 g/dL — ABNORMAL LOW (ref 13.0–17.0)
Hemoglobin: 8.5 g/dL — ABNORMAL LOW (ref 13.0–17.0)
Hemoglobin: 8.8 g/dL — ABNORMAL LOW (ref 13.0–17.0)
Hemoglobin: 9.5 g/dL — ABNORMAL LOW (ref 13.0–17.0)
Potassium: 4.1 mmol/L (ref 3.5–5.1)
Potassium: 4 mmol/L (ref 3.5–5.1)
Potassium: 4.2 mmol/L (ref 3.5–5.1)
Potassium: 5.3 mmol/L — ABNORMAL HIGH (ref 3.5–5.1)
Potassium: 4.7 mmol/L (ref 3.5–5.1)
Sodium: 137 mmol/L (ref 135–145)
Sodium: 137 mmol/L (ref 135–145)
Sodium: 139 mmol/L (ref 135–145)
Sodium: 135 mmol/L (ref 135–145)
Sodium: 136 mmol/L (ref 135–145)

## 2018-11-22 LAB — POCT I-STAT 7, (LYTES, BLD GAS, ICA,H+H)
Acid-base deficit: 2 mmol/L (ref 0.0–2.0)
Bicarbonate: 23.7 mmol/L (ref 20.0–28.0)
Calcium, Ion: 1.2 mmol/L (ref 1.15–1.40)
HCT: 36 % — ABNORMAL LOW (ref 39.0–52.0)
Hemoglobin: 12.2 g/dL — ABNORMAL LOW (ref 13.0–17.0)
O2 Saturation: 100 %
Potassium: 4.2 mmol/L (ref 3.5–5.1)
Sodium: 140 mmol/L (ref 135–145)
TCO2: 25 mmol/L (ref 22–32)
pCO2 arterial: 43 mmHg (ref 32.0–48.0)
pH, Arterial: 7.349 — ABNORMAL LOW (ref 7.350–7.450)
pO2, Arterial: 377 mmHg — ABNORMAL HIGH (ref 83.0–108.0)

## 2018-11-22 LAB — MAGNESIUM
Magnesium: 2.6 mg/dL — ABNORMAL HIGH (ref 1.7–2.4)
Magnesium: 2.6 mg/dL — ABNORMAL HIGH (ref 1.7–2.4)

## 2018-11-22 MED ORDER — INSULIN DETEMIR 100 UNIT/ML ~~LOC~~ SOLN
15.0000 [IU] | Freq: Every day | SUBCUTANEOUS | Status: DC
  Filled 2018-11-22: qty 0.15

## 2018-11-22 MED ORDER — TRAMADOL HCL 50 MG PO TABS: 50.0000 mg | ORAL_TABLET | ORAL | Status: DC | PRN

## 2018-11-22 MED ORDER — METOPROLOL TARTRATE 25 MG PO TABS
25.0000 mg | ORAL_TABLET | Freq: Two times a day (BID) | ORAL | Status: DC
  Administered 2018-11-22 – 2018-11-27 (×11): 25 mg via ORAL
  Filled 2018-11-22 (×11): qty 1

## 2018-11-22 MED ORDER — INSULIN ASPART 100 UNIT/ML ~~LOC~~ SOLN
0.0000 [IU] | SUBCUTANEOUS | Status: DC
  Administered 2018-11-22 – 2018-11-23 (×4): 2 [IU] via SUBCUTANEOUS

## 2018-11-22 MED ORDER — METOPROLOL TARTRATE 25 MG/10 ML ORAL SUSPENSION
25.0000 mg | Freq: Two times a day (BID) | ORAL | Status: DC
  Filled 2018-11-22 (×9): qty 10

## 2018-11-22 MED ORDER — FUROSEMIDE 10 MG/ML IJ SOLN
40.0000 mg | Freq: Once | INTRAMUSCULAR | Status: AC
  Administered 2018-11-22: 40 mg via INTRAVENOUS
  Filled 2018-11-22: qty 4

## 2018-11-22 MED ORDER — FUROSEMIDE 40 MG PO TABS
40.0000 mg | ORAL_TABLET | Freq: Every day | ORAL | Status: DC
  Administered 2018-11-23 – 2018-11-26 (×4): 40 mg via ORAL
  Filled 2018-11-22 (×4): qty 1

## 2018-11-22 MED ORDER — INSULIN DETEMIR 100 UNIT/ML ~~LOC~~ SOLN
15.0000 [IU] | Freq: Every day | SUBCUTANEOUS | Status: DC
  Administered 2018-11-22 – 2018-11-24 (×3): 15 [IU] via SUBCUTANEOUS
  Filled 2018-11-22 (×5): qty 0.15

## 2018-11-22 MED FILL — Sodium Bicarbonate IV Soln 8.4%: INTRAVENOUS | Qty: 50 | Status: AC

## 2018-11-22 MED FILL — Electrolyte-R (PH 7.4) Solution: INTRAVENOUS | Qty: 3000 | Status: AC

## 2018-11-22 MED FILL — Mannitol IV Soln 20%: INTRAVENOUS | Qty: 500 | Status: AC

## 2018-11-22 MED FILL — Heparin Sodium (Porcine) Inj 1000 Unit/ML: INTRAMUSCULAR | Qty: 30 | Status: AC

## 2018-11-22 MED FILL — Heparin Sodium (Porcine) Inj 1000 Unit/ML: INTRAMUSCULAR | Qty: 10 | Status: AC

## 2018-11-22 MED FILL — Potassium Chloride Inj 2 mEq/ML: INTRAVENOUS | Qty: 40 | Status: AC

## 2018-11-22 MED FILL — Magnesium Sulfate Inj 50%: INTRAMUSCULAR | Qty: 10 | Status: AC

## 2018-11-22 MED FILL — Lidocaine HCl(Cardiac) IV PF Soln Pref Syr 100 MG/5ML (2%): INTRAVENOUS | Qty: 5 | Status: AC

## 2018-11-22 MED FILL — Sodium Chloride IV Soln 0.9%: INTRAVENOUS | Qty: 2000 | Status: AC

## 2018-11-22 NOTE — Progress Notes (Signed)
1 Day Post-Op Procedure(s) (LRB): CORONARY ARTERY BYPASS GRAFTING (CABG) TIMES FIVE USING LEFT INTERNAL MAMMARY ARTERY AND LEFT SAPHENOUS VEIN HARVESTED ENDOSCOPICALLY (N/A) TRANSESOPHAGEAL ECHOCARDIOGRAM (TEE) (N/A) Subjective: Sore but otherwise feels ok   Objective: Vital signs in last 24 hours: Temp:  [96.6 F (35.9 C)-99.1 F (37.3 C)] 99 F (37.2 C) (04/24 0800) Pulse Rate:  [76-91] 81 (04/24 0800) Cardiac Rhythm: Normal sinus rhythm;Atrial paced (04/24 0400) Resp:  [11-24] 24 (04/24 0800) BP: (81-124)/(46-84) 124/71 (04/24 0800) SpO2:  [88 %-100 %] 94 % (04/24 0800) Arterial Line BP: (114-163)/(42-70) 150/51 (04/24 0800) FiO2 (%):  [36 %-50 %] 36 % (04/23 1810) Weight:  [81.1 kg] 81.1 kg (04/24 0500)  Hemodynamic parameters for last 24 hours: PAP: (16-32)/(4-18) 22/6 CO:  [3.3 L/min-4.5 L/min] 4.3 L/min CI:  [1.7 L/min/m2-2.3 L/min/m2] 2.2 L/min/m2  Intake/Output from previous day: 04/23 0701 - 04/24 0700 In: 6008.8 [I.V.:3536.5; Blood:400; IV Piggyback:2072.3] Out: 0947 [Urine:3115; Blood:1000; Chest Tube:340] Intake/Output this shift: No intake/output data recorded.  General appearance: alert and cooperative Neurologic: intact Heart: regular rate and rhythm, S1, S2 normal, no murmur, click, rub or gallop Lungs: clear to auscultation bilaterally Extremities: edema mild Wound: dressings dry  Lab Results: Recent Labs    11/21/18 1830 11/22/18 0451  WBC 13.4* 15.0*  HGB 12.2* 11.7*  HCT 35.7* 34.6*  PLT 87* 85*   BMET:  Recent Labs    11/21/18 1830 11/22/18 0451  NA 138 138  K 4.2 4.4  CL 108 108  CO2 21* 18*  GLUCOSE 123* 186*  BUN 8 11  CREATININE 0.79 0.98  CALCIUM 7.3* 7.8*    PT/INR:  Recent Labs    11/21/18 1304  LABPROT 21.3*  INR 1.9*   ABG    Component Value Date/Time   PHART 7.383 11/21/2018 1756   HCO3 20.7 11/21/2018 1756   TCO2 22 11/21/2018 1756   ACIDBASEDEF 4.0 (H) 11/21/2018 1756   O2SAT 97.0 11/21/2018 1756   CBG  (last 3)  Recent Labs    11/22/18 0231 11/22/18 0317 11/22/18 0414  GLUCAP 86 115* 175*   CXR: ok  Assessment/Plan: S/P Procedure(s) (LRB): CORONARY ARTERY BYPASS GRAFTING (CABG) TIMES FIVE USING LEFT INTERNAL MAMMARY ARTERY AND LEFT SAPHENOUS VEIN HARVESTED ENDOSCOPICALLY (N/A) TRANSESOPHAGEAL ECHOCARDIOGRAM (TEE) (N/A)  POD 1 Hemodynamically stable in sinus rhythm. Continue Lopressor. Resume lisinopril later as tolerated.  DM: glucose under adequate control on insulin drip. He was on Metformin preop with Hgb A1c of 6.2. Will start Levemir and SSI.  Volume excess: filling pressures are low. Wt is 10 lbs over preop. Will start oral Lasix tomorrow.  DC chest tubes, swan, arterial line. OOB, IS.  Thrombocytopenia: probably due to pump run and heparin. Follow. Hold off on Lovenox for now.   Plan ASA and Plavix at discharge.   LOS: 3 days    Gaye Pollack 11/22/2018

## 2018-11-22 NOTE — Progress Notes (Signed)
      [address]       [place] [address]             [phone]      POD # 1 CABG  Up in chair, no complaints  BP 132/73   Pulse 79   Temp 98.6 F (37 C) (Oral)   Resp (!) 21   Ht 5\' 9"  (1.753 m)   Wt 81.1 kg   SpO2 95%   BMI 26.40 kg/m   Intake/Output Summary (Last 24 hours) at 11/22/2018 1726 Last data filed at 11/22/2018 1700 Gross per 24 hour  Intake 1023.13 ml  Output 1420 ml  Net -396.87 ml  K= 4.4, creatinine 0.94 Hct- 35 PLT up to 111K  Doing well POD # 1  Ripken Rekowski C. Roxan Hockey, MD [facility] [phone]

## 2018-11-23 ENCOUNTER — Inpatient Hospital Stay (HOSPITAL_COMMUNITY): Payer: PPO

## 2018-11-23 LAB — CBC
HCT: 33.5 % — ABNORMAL LOW (ref 39.0–52.0)
Hemoglobin: 11 g/dL — ABNORMAL LOW (ref 13.0–17.0)
MCH: 29.6 pg (ref 26.0–34.0)
MCHC: 32.8 g/dL (ref 30.0–36.0)
MCV: 90.1 fL (ref 80.0–100.0)
Platelets: 94 10*3/uL — ABNORMAL LOW (ref 150–400)
RBC: 3.72 MIL/uL — ABNORMAL LOW (ref 4.22–5.81)
RDW: 12.9 % (ref 11.5–15.5)
WBC: 12.4 10*3/uL — ABNORMAL HIGH (ref 4.0–10.5)
nRBC: 0 % (ref 0.0–0.2)

## 2018-11-23 LAB — GLUCOSE, CAPILLARY
Glucose-Capillary: 135 mg/dL — ABNORMAL HIGH (ref 70–99)
Glucose-Capillary: 145 mg/dL — ABNORMAL HIGH (ref 70–99)
Glucose-Capillary: 141 mg/dL — ABNORMAL HIGH (ref 70–99)
Glucose-Capillary: 121 mg/dL — ABNORMAL HIGH (ref 70–99)
Glucose-Capillary: 136 mg/dL — ABNORMAL HIGH (ref 70–99)

## 2018-11-23 LAB — BASIC METABOLIC PANEL
Anion gap: 6 (ref 5–15)
BUN: 21 mg/dL (ref 8–23)
CO2: 24 mmol/L (ref 22–32)
Calcium: 7.9 mg/dL — ABNORMAL LOW (ref 8.9–10.3)
Chloride: 106 mmol/L (ref 98–111)
Creatinine, Ser: 1.08 mg/dL (ref 0.61–1.24)
GFR calc Af Amer: >60 mL/min (ref >60)
GFR calc non Af Amer: >60 mL/min (ref >60)
Glucose, Bld: 131 mg/dL — ABNORMAL HIGH (ref 70–99)
Potassium: 4.2 mmol/L (ref 3.5–5.1)
Sodium: 136 mmol/L (ref 135–145)

## 2018-11-23 MED ORDER — MAGNESIUM HYDROXIDE 400 MG/5ML PO SUSP: 30.0000 mL | Freq: Every day | ORAL | Status: DC | PRN

## 2018-11-23 MED ORDER — MOVING RIGHT ALONG BOOK
Freq: Once | Status: AC
  Administered 2018-11-23: 12:00:00

## 2018-11-23 MED ORDER — BISACODYL 5 MG PO TBEC: 10.0000 mg | DELAYED_RELEASE_TABLET | Freq: Every day | ORAL | Status: DC | PRN

## 2018-11-23 MED ORDER — INSULIN ASPART 100 UNIT/ML ~~LOC~~ SOLN
0.0000 [IU] | Freq: Three times a day (TID) | SUBCUTANEOUS | Status: DC
  Administered 2018-11-23 – 2018-11-25 (×4): 2 [IU] via SUBCUTANEOUS
  Administered 2018-11-26 (×2): 3 [IU] via SUBCUTANEOUS
  Administered 2018-11-26: 2 [IU] via SUBCUTANEOUS
  Administered 2018-11-27: 3 [IU] via SUBCUTANEOUS

## 2018-11-23 MED ORDER — BISACODYL 10 MG RE SUPP: 10.0000 mg | Freq: Every day | RECTAL | Status: DC | PRN

## 2018-11-23 MED ORDER — INSULIN ASPART 100 UNIT/ML ~~LOC~~ SOLN: 0.0000 [IU] | Freq: Three times a day (TID) | SUBCUTANEOUS | Status: DC

## 2018-11-23 MED ORDER — SODIUM CHLORIDE 0.9% FLUSH
3.0000 mL | Freq: Two times a day (BID) | INTRAVENOUS | Status: DC
  Administered 2018-11-23 – 2018-11-26 (×8): 3 mL via INTRAVENOUS

## 2018-11-23 MED ORDER — SODIUM CHLORIDE 0.9 % IV SOLN: 250.0000 mL | INTRAVENOUS | Status: DC | PRN

## 2018-11-23 MED ORDER — SODIUM CHLORIDE 0.9% FLUSH: 3.0000 mL | INTRAVENOUS | Status: DC | PRN

## 2018-11-23 MED ORDER — POTASSIUM CHLORIDE CRYS ER 10 MEQ PO TBCR
20.0000 meq | EXTENDED_RELEASE_TABLET | Freq: Every day | ORAL | Status: DC
  Administered 2018-11-23 – 2018-11-27 (×5): 20 meq via ORAL
  Filled 2018-11-23 (×9): qty 2

## 2018-11-23 MED ORDER — ALUM & MAG HYDROXIDE-SIMETH 200-200-20 MG/5ML PO SUSP
15.0000 mL | Freq: Four times a day (QID) | ORAL | Status: DC | PRN
  Administered 2018-11-23: 15 mL via ORAL
  Filled 2018-11-23: qty 30

## 2018-11-23 NOTE — Progress Notes (Signed)
Patient arrived from [facility] to 4E room 8, p/o day 2 from CABG x5 w/Dr.Bartle on 11/21/18.Marland Kitchen  Telemetry monitor applied and CCMD notified.  Patient oriented to unit and room to include call light and phone.  Patient noted to have had 3 to 4 loose BM's today and another upon arrival to 4E.  Will make daily laxative prn at this time.  Will continue to monitor.

## 2018-11-23 NOTE — Evaluation (Signed)
Physical Therapy Evaluation Patient Details Name: Keith Lucero. MRN: [id number] DOB: 16-May-1946 Today's Date: 11/23/2018   History of Present Illness  Keith Lucero. is a 73 y.o. male with CAD, hypertension, and type 2 diabetes mellitus, who presents to the [facility] ED for evaluation of chest pain.  pt found to have multivessel coronary disease s/p NSTEMI.  4/23 s/p CABG x5.  Clinical Impression  Pt admitted with/for CP, NSTEMI, s/p CABGx5.  Pt is generally at a min guard to supervision level of mobility, but does not aloways follow sternal precautions..  Pt currently limited functionally due to the problems listed below.  (see problems list.)  Pt will benefit from PT to maximize function and safety to be able to get home safely with available assist.     Follow Up Recommendations Home health PT;Supervision - Intermittent    Equipment Recommendations  Other (comment)(TBA)    Recommendations for Other Services       Precautions / Restrictions Precautions Precautions: Fall;Sternal Precaution Comments: pt heard sternal precautions, but doesn't internallize Restrictions Weight Bearing Restrictions: Yes(Sternal precautions)      Mobility  Bed Mobility Overal bed mobility: Needs Assistance Bed Mobility: Sidelying to Sit   Sidelying to sit: Min assist       General bed mobility comments: worked on transition from side to sit without using UE's  Transfers Overall transfer level: Needs assistance   Transfers: Sit to/from Stand Sit to Stand: Min guard         General transfer comment: Reinforced sternal precautions  Ambulation/Gait Ambulation/Gait assistance: Supervision Gait Distance (Feet): 300 Feet Assistive device: None Gait Pattern/deviations: Step-through pattern Gait velocity: slower   General Gait Details: generally steady and able to scan/turn without overt deviation.  Stairs            Wheelchair Mobility    Modified Rankin (Stroke  Patients Only)       Balance Overall balance assessment: Needs assistance Sitting-balance support: Feet supported Sitting balance-Leahy Scale: Good       Standing balance-Leahy Scale: Good                               Pertinent Vitals/Pain Pain Assessment: Faces Faces Pain Scale: Hurts little more Pain Location: sternal Pain Descriptors / Indicators: Aching;Guarding Pain Intervention(s): Monitored during session    Home Living Family/patient expects to be discharged to:: Private residence Living Arrangements: Children Available Help at Discharge: Family;Available 24 hours/day Type of Home: House Home Access: Stairs to enter   [employer] of Steps: 1 Home Layout: One level Home Equipment: Cane - quad;Cane - single point(not sure if still has BSC and RW)      Prior Function Level of Independence: Independent               Hand Dominance   Dominant Hand: Right    Extremity/Trunk Assessment        Lower Extremity Assessment Lower Extremity Assessment: Overall WFL for tasks assessed       Communication   Communication: HOH  Cognition Arousal/Alertness: Awake/alert Behavior During Therapy: WFL for tasks assessed/performed Overall Cognitive Status: (NT formally, pt doesn't internalize precautions well.)                                 General Comments: follows simple direction       General Comments General  comments (skin integrity, edema, etc.): vss    Exercises     Assessment/Plan    PT Assessment Patient needs continued PT services  PT Problem List Decreased activity tolerance;Decreased mobility;Cardiopulmonary status limiting activity;Pain       PT Treatment Interventions DME instruction;Gait training;Stair training;Functional mobility training;Therapeutic activities;Balance training;Patient/family education    PT Goals (Current goals can be found in the Care Plan section)  Acute Rehab PT  Goals Patient Stated Goal: Home when able PT Goal Formulation: With patient Time For Goal Achievement: 11/30/18 Potential to Achieve Goals: Good    Frequency Min 3X/week   Barriers to discharge        Co-evaluation               AM-PAC PT "6 Clicks" Mobility  Outcome Measure Help needed turning from your back to your side while in a flat bed without using bedrails?: A Little Help needed moving from lying on your back to sitting on the side of a flat bed without using bedrails?: A Little Help needed moving to and from a bed to a chair (including a wheelchair)?: A Little Help needed standing up from a chair using your arms (e.g., wheelchair or bedside chair)?: A Little Help needed to walk in hospital room?: None Help needed climbing 3-5 steps with a railing? : A Little 6 Click Score: 19    End of Session   Activity Tolerance: Patient tolerated treatment well Patient left: in bed;Other (comment);with call bell/phone within reach(EOB) Nurse Communication: Mobility status;Precautions PT Visit Diagnosis: Pain;Other abnormalities of gait and mobility (R26.89) Pain - part of body: (sternum)    Time: 5993-5701 PT Time Calculation (min) (ACUTE ONLY): 20 min   Charges:   PT Evaluation $PT Eval Moderate Complexity: 1 Mod          11/23/2018  Donnella Sham, PT Acute Rehabilitation Services [phone]  (pager) [phone]  (office)  Keith Lucero Keith Lucero 11/23/2018, 5:08 PM

## 2018-11-23 NOTE — Progress Notes (Signed)
Spoke with Marcene Brawn RN re dc CVC order.

## 2018-11-23 NOTE — Progress Notes (Signed)
2 Days Post-Op Procedure(s) (LRB): CORONARY ARTERY BYPASS GRAFTING (CABG) TIMES FIVE USING LEFT INTERNAL MAMMARY ARTERY AND LEFT SAPHENOUS VEIN HARVESTED ENDOSCOPICALLY (N/A) TRANSESOPHAGEAL ECHOCARDIOGRAM (TEE) (N/A) Subjective: No complaints, ambulated around unit this AM  Objective: Vital signs in last 24 hours: Temp:  [97.3 F (36.3 C)-99.1 F (37.3 C)] 97.3 F (36.3 C) (04/25 0700) Pulse Rate:  [68-83] 72 (04/25 0700) Cardiac Rhythm: Normal sinus rhythm (04/25 0400) Resp:  [10-27] 18 (04/25 0700) BP: (96-132)/(53-99) 108/63 (04/25 0700) SpO2:  [90 %-97 %] 95 % (04/25 0700) Arterial Line BP: (127-160)/(44-58) 160/56 (04/24 1430) Weight:  [80.7 kg] 80.7 kg (04/25 0500)  Hemodynamic parameters for last 24 hours: PAP: (10-18)/(5-13) 18/13  Intake/Output from previous day: 04/24 0701 - 04/25 0700 In: 630 [P.O.:100; I.V.:379.9; IV Piggyback:150.1] Out: 1270 [Urine:1140; Chest Tube:130] Intake/Output this shift: No intake/output data recorded.  General appearance: alert, cooperative and no distress Neurologic: intact Heart: regular rate and rhythm Lungs: diminished breath sounds bibasilar Abdomen: normal findings: soft, non-tender  Lab Results: Recent Labs    11/22/18 1530 11/23/18 0438  WBC 16.2* 12.4*  HGB 11.9* 11.0*  HCT 35.3* 33.5*  PLT 111* 94*   BMET:  Recent Labs    11/22/18 1530 11/23/18 0438  NA 136 136  K 4.4 4.2  CL 108 106  CO2 19* 24  GLUCOSE 164* 131*  BUN 16 21  CREATININE 0.94 1.08  CALCIUM 7.8* 7.9*    PT/INR:  Recent Labs    11/21/18 1304  LABPROT 21.3*  INR 1.9*   ABG    Component Value Date/Time   PHART 7.383 11/21/2018 1756   HCO3 20.7 11/21/2018 1756   TCO2 22 11/21/2018 1756   ACIDBASEDEF 4.0 (H) 11/21/2018 1756   O2SAT 97.0 11/21/2018 1756   CBG (last 3)  Recent Labs    11/22/18 2345 11/23/18 0402 11/23/18 0825  GLUCAP 157* 135* 145*    Assessment/Plan: S/P Procedure(s) (LRB): CORONARY ARTERY BYPASS GRAFTING  (CABG) TIMES FIVE USING LEFT INTERNAL MAMMARY ARTERY AND LEFT SAPHENOUS VEIN HARVESTED ENDOSCOPICALLY (N/A) TRANSESOPHAGEAL ECHOCARDIOGRAM (TEE) (N/A) Plan for transfer to step-down: see transfer orders  Doing well POD # 2 CV- in SR, BP well controlled RESP- continue IS Renal - creatinine and lytes OK- PO lasix ENDO- CBG controlled , continue levemir, change to [facility] and HS SSI Thrombocytopenia- PLT down to 94 K form 111K, not on lovenox or heparin, follow Continue cardiac rehab   LOS: 4 days    Keith Lucero 11/23/2018

## 2018-11-24 ENCOUNTER — Inpatient Hospital Stay (HOSPITAL_COMMUNITY): Payer: PPO

## 2018-11-24 LAB — CBC
HCT: 31.4 % — ABNORMAL LOW (ref 39.0–52.0)
Hemoglobin: 10.8 g/dL — ABNORMAL LOW (ref 13.0–17.0)
MCH: 30.2 pg (ref 26.0–34.0)
MCHC: 34.4 g/dL (ref 30.0–36.0)
MCV: 87.7 fL (ref 80.0–100.0)
Platelets: 89 10*3/uL — ABNORMAL LOW (ref 150–400)
RBC: 3.58 MIL/uL — ABNORMAL LOW (ref 4.22–5.81)
RDW: 12.7 % (ref 11.5–15.5)
WBC: 8.3 10*3/uL (ref 4.0–10.5)
nRBC: 0 % (ref 0.0–0.2)

## 2018-11-24 LAB — BASIC METABOLIC PANEL
Anion gap: 7 (ref 5–15)
BUN: 22 mg/dL (ref 8–23)
CO2: 24 mmol/L (ref 22–32)
Calcium: 8 mg/dL — ABNORMAL LOW (ref 8.9–10.3)
Chloride: 105 mmol/L (ref 98–111)
Creatinine, Ser: 0.99 mg/dL (ref 0.61–1.24)
GFR calc Af Amer: >60 mL/min (ref >60)
GFR calc non Af Amer: >60 mL/min (ref >60)
Glucose, Bld: 129 mg/dL — ABNORMAL HIGH (ref 70–99)
Potassium: 3.7 mmol/L (ref 3.5–5.1)
Sodium: 136 mmol/L (ref 135–145)

## 2018-11-24 LAB — GLUCOSE, CAPILLARY
Glucose-Capillary: 127 mg/dL — ABNORMAL HIGH (ref 70–99)
Glucose-Capillary: 124 mg/dL — ABNORMAL HIGH (ref 70–99)
Glucose-Capillary: 115 mg/dL — ABNORMAL HIGH (ref 70–99)
Glucose-Capillary: 112 mg/dL — ABNORMAL HIGH (ref 70–99)

## 2018-11-24 MED ORDER — METFORMIN HCL ER 750 MG PO TB24
750.0000 mg | ORAL_TABLET | Freq: Every day | ORAL | Status: DC
  Administered 2018-11-24 – 2018-11-26 (×3): 750 mg via ORAL
  Filled 2018-11-24 (×4): qty 1

## 2018-11-24 MED ORDER — LISINOPRIL 10 MG PO TABS
20.0000 mg | ORAL_TABLET | Freq: Every day | ORAL | Status: DC
  Administered 2018-11-24: 20 mg via ORAL
  Filled 2018-11-24: qty 2

## 2018-11-24 NOTE — Progress Notes (Signed)
Patient ambulated in hallway approximately 200 feet with nursing staff. Back in room to chair will monitor patient. Majestic Molony, Bettina Gavia rN

## 2018-11-24 NOTE — Progress Notes (Signed)
3 Days Post-Op Procedure(s) (LRB): CORONARY ARTERY BYPASS GRAFTING (CABG) TIMES FIVE USING LEFT INTERNAL MAMMARY ARTERY AND LEFT SAPHENOUS VEIN HARVESTED ENDOSCOPICALLY (N/A) TRANSESOPHAGEAL ECHOCARDIOGRAM (TEE) (N/A) Subjective: Doesn't feel as well today  Objective: Vital signs in last 24 hours: Temp:  [97.9 F (36.6 C)-98.9 F (37.2 C)] 97.9 F (36.6 C) (04/26 0603) Pulse Rate:  [67-77] 73 (04/26 0935) Cardiac Rhythm: Normal sinus rhythm (04/26 0713) Resp:  [15-23] 19 (04/26 0935) BP: (120-149)/(58-113) 126/58 (04/26 0935) SpO2:  [94 %-100 %] 100 % (04/26 0935) Weight:  [76.6 kg] 76.6 kg (04/25 1525)  Hemodynamic parameters for last 24 hours:    Intake/Output from previous day: 04/25 0701 - 04/26 0700 In: 360 [P.O.:360] Out: 60 [Urine:60] Intake/Output this shift: Total I/O In: 200 [P.O.:200] Out: -   General appearance: alert, cooperative and no distress Neurologic: intact Heart: regular rate and rhythm Lungs: diminished breath sounds lft base Abdomen: normal findings: soft, non-tender  Lab Results: Recent Labs    11/23/18 0438 11/24/18 0243  WBC 12.4* 8.3  HGB 11.0* 10.8*  HCT 33.5* 31.4*  PLT 94* 89*   BMET:  Recent Labs    11/23/18 0438 11/24/18 0243  NA 136 136  K 4.2 3.7  CL 106 105  CO2 24 24  GLUCOSE 131* 129*  BUN 21 22  CREATININE 1.08 0.99  CALCIUM 7.9* 8.0*    PT/INR:  Recent Labs    11/21/18 1304  LABPROT 21.3*  INR 1.9*   ABG    Component Value Date/Time   PHART 7.383 11/21/2018 1756   HCO3 20.7 11/21/2018 1756   TCO2 22 11/21/2018 1756   ACIDBASEDEF 4.0 (H) 11/21/2018 1756   O2SAT 97.0 11/21/2018 1756   CBG (last 3)  Recent Labs    11/23/18 1711 11/23/18 2047 11/24/18 0600  GLUCAP 121* 136* 127*    Assessment/Plan: S/P Procedure(s) (LRB): CORONARY ARTERY BYPASS GRAFTING (CABG) TIMES FIVE USING LEFT INTERNAL MAMMARY ARTERY AND LEFT SAPHENOUS VEIN HARVESTED ENDOSCOPICALLY (N/A) TRANSESOPHAGEAL ECHOCARDIOGRAM  (TEE) (N/A) -  CV stable in SR, hypertension- will restart lisinopril at reduced dose RESP- left lower lobe atelectasis, small left effusion- continue IS Renal- creatinine normal Thrombocytopenia- PLT down slightly- not on heparin/ lovenox Continue cardiac rehab  LOS: 5 days    Melrose Nakayama 11/24/2018

## 2018-11-25 ENCOUNTER — Encounter (HOSPITAL_COMMUNITY): Payer: Self-pay | Admitting: *Deleted

## 2018-11-25 LAB — CBC
HCT: 32 % — ABNORMAL LOW (ref 39.0–52.0)
Hemoglobin: 10.7 g/dL — ABNORMAL LOW (ref 13.0–17.0)
MCH: 29.6 pg (ref 26.0–34.0)
MCHC: 33.4 g/dL (ref 30.0–36.0)
MCV: 88.4 fL (ref 80.0–100.0)
Platelets: 101 10*3/uL — ABNORMAL LOW (ref 150–400)
RBC: 3.62 MIL/uL — ABNORMAL LOW (ref 4.22–5.81)
RDW: 12.7 % (ref 11.5–15.5)
WBC: 6.5 10*3/uL (ref 4.0–10.5)
nRBC: 0 % (ref 0.0–0.2)

## 2018-11-25 LAB — GLUCOSE, CAPILLARY
Glucose-Capillary: 117 mg/dL — ABNORMAL HIGH (ref 70–99)
Glucose-Capillary: 129 mg/dL — ABNORMAL HIGH (ref 70–99)
Glucose-Capillary: 145 mg/dL — ABNORMAL HIGH (ref 70–99)
Glucose-Capillary: 124 mg/dL — ABNORMAL HIGH (ref 70–99)

## 2018-11-25 LAB — BASIC METABOLIC PANEL
Anion gap: 9 (ref 5–15)
BUN: 17 mg/dL (ref 8–23)
CO2: 23 mmol/L (ref 22–32)
Calcium: 8 mg/dL — ABNORMAL LOW (ref 8.9–10.3)
Chloride: 107 mmol/L (ref 98–111)
Creatinine, Ser: 0.91 mg/dL (ref 0.61–1.24)
GFR calc Af Amer: >60 mL/min (ref >60)
GFR calc non Af Amer: >60 mL/min (ref >60)
Glucose, Bld: 109 mg/dL — ABNORMAL HIGH (ref 70–99)
Potassium: 3.8 mmol/L (ref 3.5–5.1)
Sodium: 139 mmol/L (ref 135–145)

## 2018-11-25 MED ORDER — AMLODIPINE BESYLATE 10 MG PO TABS
10.0000 mg | ORAL_TABLET | Freq: Every day | ORAL | Status: DC
  Administered 2018-11-25 – 2018-11-27 (×3): 10 mg via ORAL
  Filled 2018-11-25 (×3): qty 1

## 2018-11-25 MED ORDER — LISINOPRIL 40 MG PO TABS
40.0000 mg | ORAL_TABLET | Freq: Every day | ORAL | Status: DC
  Administered 2018-11-25 – 2018-11-27 (×3): 40 mg via ORAL
  Filled 2018-11-25 (×3): qty 1

## 2018-11-25 NOTE — Progress Notes (Signed)
EPW were rolled and taped to chest as ordered. Colsen Modi, Bettina Gavia rN

## 2018-11-25 NOTE — Progress Notes (Signed)
Patient up to chair with lunch. Keith Lucero, Bettina Gavia rN

## 2018-11-25 NOTE — Progress Notes (Signed)
Physical Therapy Treatment Patient Details Name: Keith Lucero. MRN: [id number] DOB: 04/22/1946 Today's Date: 11/25/2018    History of Present Illness Keith Lucero. is a 73 y.o. male with CAD, hypertension, and type 2 diabetes mellitus, who presents to the [facility] ED for evaluation of chest pain.  pt found to have multivessel coronary disease s/p NSTEMI.  4/23 s/p CABG x5.    PT Comments    Pt is progressing well with mobility, but needs reinforcement of sternal precautions as they relate to different activities.  Emphasis today on gait stability/speed/stamina and continued education.   Follow Up Recommendations  Home health PT;Supervision - Intermittent     Equipment Recommendations  None recommended by PT    Recommendations for Other Services       Precautions / Restrictions Precautions Precautions: Fall;Sternal Precaution Comments: still needs reinforcement Restrictions Weight Bearing Restrictions: Yes RUE Weight Bearing: Non weight bearing    Mobility  Bed Mobility               General bed mobility comments: up in  chair on arrival  Transfers Overall transfer level: Needs assistance   Transfers: Sit to/from Stand Sit to Stand: Modified independent (Device/Increase time)         General transfer comment: Reinforced sternal precautions as related to standing and sitting  Ambulation/Gait Ambulation/Gait assistance: Supervision Gait Distance (Feet): 800 Feet Assistive device: None Gait Pattern/deviations: Step-through pattern Gait velocity: moderate Gait velocity interpretation: 1.31 - 2.62 ft/sec, indicative of limited community ambulator General Gait Details: generally steady.  No deviation with abrupt turns, stepping over objects, backing up and increasing speed to cuing.  Sats maintained in the upper 90's, EHR in the upper 90's/lower 100's bpm except for spike into the 130's 140's that may have been noise.   Stairs              Wheelchair Mobility    Modified Rankin (Stroke Patients Only)       Balance Overall balance assessment: Needs assistance Sitting-balance support: Feet supported Sitting balance-Leahy Scale: Good       Standing balance-Leahy Scale: Good                              Cognition Arousal/Alertness: Awake/alert Behavior During Therapy: WFL for tasks assessed/performed Overall Cognitive Status: Within Functional Limits for tasks assessed                                 General Comments: follows simple direction       Exercises      General Comments        Pertinent Vitals/Pain Pain Assessment: Faces Faces Pain Scale: Hurts little more Pain Location: sternal Pain Descriptors / Indicators: Aching;Guarding    Home Living                      Prior Function            PT Goals (current goals can now be found in the care plan section) Acute Rehab PT Goals Patient Stated Goal: Home when able PT Goal Formulation: With patient Time For Goal Achievement: 11/30/18 Potential to Achieve Goals: Good Progress towards PT goals: Progressing toward goals    Frequency    Min 3X/week      PT Plan Current plan remains appropriate    Co-evaluation  AM-PAC PT "6 Clicks" Mobility   Outcome Measure  Help needed turning from your back to your side while in a flat bed without using bedrails?: None Help needed moving from lying on your back to sitting on the side of a flat bed without using bedrails?: A Little Help needed moving to and from a bed to a chair (including a wheelchair)?: None Help needed standing up from a chair using your arms (e.g., wheelchair or bedside chair)?: None Help needed to walk in hospital room?: None Help needed climbing 3-5 steps with a railing? : A Little 6 Click Score: 22    End of Session   Activity Tolerance: Patient tolerated treatment well Patient left: in chair;with call bell/phone  within reach Nurse Communication: Mobility status;Precautions PT Visit Diagnosis: Other abnormalities of gait and mobility (R26.89)     Time: 5747-3403 PT Time Calculation (min) (ACUTE ONLY): 16 min  Charges:  $Gait Training: 8-22 mins                     11/25/2018  Donnella Sham, PT Acute Rehabilitation Services [phone]  (pager) [phone]  (office)   Keith Lucero 11/25/2018, 12:58 PM

## 2018-11-25 NOTE — Progress Notes (Signed)
Patient watching the discharge heart surgery video on patient education channel . Keith Lucero, Bettina Gavia rN

## 2018-11-25 NOTE — Progress Notes (Addendum)
[address]       [place] [address]             [phone]      4 Days Post-Op Procedure(s) (LRB): CORONARY ARTERY BYPASS GRAFTING (CABG) TIMES FIVE USING LEFT INTERNAL MAMMARY ARTERY AND LEFT SAPHENOUS VEIN HARVESTED ENDOSCOPICALLY (N/A) TRANSESOPHAGEAL ECHOCARDIOGRAM (TEE) (N/A) Subjective: Feels pretty well, some chest soreness   Objective: Vital signs in last 24 hours: Temp:  [97.9 F (36.6 C)-99.5 F (37.5 C)] 97.9 F (36.6 C) (04/27 0555) Pulse Rate:  [66-73] 67 (04/27 0555) Cardiac Rhythm: Normal sinus rhythm (04/26 1910) Resp:  [19-22] 22 (04/27 0555) BP: (126-157)/(58-84) 157/73 (04/27 0555) SpO2:  [97 %-100 %] 100 % (04/27 0555) Weight:  [77.7 kg] 77.7 kg (04/27 0555)  Hemodynamic parameters for last 24 hours:    Intake/Output from previous day: 04/26 0701 - 04/27 0700 In: 200 [P.O.:200] Out: 750 [Urine:750] Intake/Output this shift: No intake/output data recorded.  General appearance: alert, cooperative and no distress Heart: regular rate and rhythm Lungs: mildly dim in left base Abdomen: benign Extremities: minor LE edema Wound: incis healing well  Lab Results: Recent Labs    11/24/18 0243 11/25/18 0320  WBC 8.3 6.5  HGB 10.8* 10.7*  HCT 31.4* 32.0*  PLT 89* 101*   BMET:  Recent Labs    11/24/18 0243 11/25/18 0320  NA 136 139  K 3.7 3.8  CL 105 107  CO2 24 23  GLUCOSE 129* 109*  BUN 22 17  CREATININE 0.99 0.91  CALCIUM 8.0* 8.0*    PT/INR: No results for input(s): LABPROT, INR in the last 72 hours. ABG    Component Value Date/Time   PHART 7.383 11/21/2018 1756   HCO3 20.7 11/21/2018 1756   TCO2 22 11/21/2018 1756   ACIDBASEDEF 4.0 (H) 11/21/2018 1756   O2SAT 97.0 11/21/2018 1756   CBG (last 3)  Recent Labs    11/24/18 1624 11/24/18 2204 11/25/18 0630  GLUCAP 115* 112* 117*    Meds Scheduled Meds: . acetaminophen  1,000 mg Oral Q6H   Or  . acetaminophen (TYLENOL) oral liquid 160 mg/5 mL   1,000 mg Per Tube Q6H  . aspirin EC  325 mg Oral Daily   Or  . aspirin  324 mg Per Tube Daily  . atorvastatin  80 mg Oral q1800  . furosemide  40 mg Oral Daily  . insulin aspart  0-15 Units Subcutaneous TID WC  . insulin detemir  15 Units Subcutaneous Daily  . lisinopril  20 mg Oral Daily  . mouth rinse  15 mL Mouth Rinse BID  . metFORMIN  750 mg Oral QHS  . metoprolol tartrate  25 mg Oral BID   Or  . metoprolol tartrate  25 mg Per Tube BID  . pantoprazole  40 mg Oral Daily  . potassium chloride  20 mEq Oral Daily  . sodium chloride flush  3 mL Intravenous Q12H   Continuous Infusions: . sodium chloride     PRN Meds:.sodium chloride, alum & mag hydroxide-simeth, bisacodyl **OR** bisacodyl, magnesium hydroxide, metoprolol tartrate, ondansetron (ZOFRAN) IV, oxyCODONE, sodium chloride flush, traMADol  Xrays Dg Chest 2 View  Result Date: 11/24/2018 CLINICAL DATA:  Status post CABG. EXAM: CHEST - 2 VIEW COMPARISON:  November 23, 2018 FINDINGS: A small left effusion with underlying opacity is more prominent the interval. The heart, hila, mediastinum, lungs, and pleura are otherwise unremarkable. IMPRESSION: A small left effusion with underlying atelectasis is more  prominent the interval. No other change. Electronically Signed   By: Dorise Bullion III M.D   On: 11/24/2018 05:41    Assessment/Plan: S/P Procedure(s) (LRB): CORONARY ARTERY BYPASS GRAFTING (CABG) TIMES FIVE USING LEFT INTERNAL MAMMARY ARTERY AND LEFT SAPHENOUS VEIN HARVESTED ENDOSCOPICALLY (N/A) TRANSESOPHAGEAL ECHOCARDIOGRAM (TEE) (N/A)  1 hemodyn stable with some systolic htn, sinus rhythm- will increase lisinopril to home dose and restart norvasc. Can d/c back-up pacer. Cont gentle diuresis 2 sats good on RA 3 labs all stable, platelet count improving 4 BS ok on metformin, will stop insulin and see how he does 5 conts pulm toilet  6 cont rehab   LOS: 6 days    [name]  [name] 11/25/2018 Pager [phone]    Chart reviewed, patient examined, agree with above. Says he feels ok, mild soreness in chest. Ambulated with PT.

## 2018-11-25 NOTE — Progress Notes (Signed)
Cardiac Rehab Advisory Cardiac Rehab Phase I is not seeing pts face to face at this time due to Covid 19 restrictions. Ambulation is occurring through nursing, PT, and mobility teams. We will help facilitate that process as needed. We are calling pts in their rooms and discussing education. We will then deliver education materials to pts RN for delivery to pt.   [phone] Called pt to review discharge ed. Since chart stated pt does not read, encouraged pt to watch discharge video and to have grandson read materials that I will fill out. Pt stated grandson is 49 and lives with him. Encouraged pt to adhere to sternal precautions and to stay in the tube. Pt stated he has been walking without walker and that PT is working with him. Encouraged walking for ex and guidelines given. Discussed with pt not chewing but he was not sure he could quit but he knows he should. Encouraged pt to use IS here and at home. Pt stated did not use yesterday as he walked several times. Encouraged him to use IS too. Discussed watching carbs and will give diabetic and heart healthy diets. Discussed CRP 2 and referred to [facility] program. Graylon Good RN BSN 11/25/2018 9:15 AM

## 2018-11-26 ENCOUNTER — Telehealth: Payer: Self-pay | Admitting: Cardiology

## 2018-11-26 LAB — GLUCOSE, CAPILLARY
Glucose-Capillary: 138 mg/dL — ABNORMAL HIGH (ref 70–99)
Glucose-Capillary: 156 mg/dL — ABNORMAL HIGH (ref 70–99)
Glucose-Capillary: 177 mg/dL — ABNORMAL HIGH (ref 70–99)
Glucose-Capillary: 133 mg/dL — ABNORMAL HIGH (ref 70–99)

## 2018-11-26 MED ORDER — HYDROCHLOROTHIAZIDE 12.5 MG PO CAPS
12.5000 mg | ORAL_CAPSULE | Freq: Every day | ORAL | Status: DC
  Administered 2018-11-26 – 2018-11-27 (×2): 12.5 mg via ORAL
  Filled 2018-11-26 (×2): qty 1

## 2018-11-26 NOTE — Progress Notes (Signed)
Physical Therapy Treatment Patient Details Name: Keith Lucero. MRN: [id number] DOB: 10-09-1945 Today's Date: 11/26/2018    History of Present Illness Ilhan Madan. is a 73 y.o. male with CAD, hypertension, and type 2 diabetes mellitus, who presents to the [facility] ED for evaluation of chest pain.  pt found to have multivessel coronary disease s/p NSTEMI.  4/23 s/p CABG x5.    PT Comments    General safety awareness is improving.  Emphasis on sternal precautions as they relate to various facets of mobility, gait stability and stair training.    Follow Up Recommendations  Home health PT;Supervision - Intermittent     Equipment Recommendations  None recommended by PT    Recommendations for Other Services       Precautions / Restrictions Precautions Precautions: Fall;Sternal Precaution Comments: still needs reinforcement Restrictions RUE Weight Bearing: Non weight bearing    Mobility  Bed Mobility Overal bed mobility: Needs Assistance Bed Mobility: Sidelying to Sit;Rolling Rolling: Supervision Sidelying to sit: Min guard       General bed mobility comments: pt was able to use LE's to build momentum with very little use of his UE's  Transfers Overall transfer level: Needs assistance   Transfers: Sit to/from Stand Sit to Stand: Modified independent (Device/Increase time)         General transfer comment: Reinforced sternal precautions as related to standing and sitting  Ambulation/Gait Ambulation/Gait assistance: Supervision;Independent Gait Distance (Feet): 400 Feet Assistive device: None Gait Pattern/deviations: Step-through pattern Gait velocity: moderate   General Gait Details: steady with mild dyspnea though sats in the 90's and EHR in the 110's max.   Stairs Stairs: Yes Stairs assistance: Supervision Stair Management: One rail Left;Alternating pattern;Forwards Number of Stairs: 5 General stair comments: steady with  rail   Wheelchair Mobility    Modified Rankin (Stroke Patients Only)       Balance     Sitting balance-Leahy Scale: Good       Standing balance-Leahy Scale: Good                              Cognition Arousal/Alertness: Awake/alert Behavior During Therapy: WFL for tasks assessed/performed Overall Cognitive Status: Within Functional Limits for tasks assessed                                        Exercises      General Comments        Pertinent Vitals/Pain Pain Assessment: Faces Faces Pain Scale: Hurts little more Pain Location: sternal and stomach Pain Descriptors / Indicators: Aching;Other (Comment)(gripey stomach) Pain Intervention(s): Monitored during session    Home Living                      Prior Function            PT Goals (current goals can now be found in the care plan section) Acute Rehab PT Goals Patient Stated Goal: Home when able PT Goal Formulation: With patient Time For Goal Achievement: 11/30/18 Potential to Achieve Goals: Good Progress towards PT goals: Progressing toward goals    Frequency    Min 3X/week      PT Plan Current plan remains appropriate    Co-evaluation              AM-PAC PT "6 Clicks"  Mobility   Outcome Measure  Help needed turning from your back to your side while in a flat bed without using bedrails?: None Help needed moving from lying on your back to sitting on the side of a flat bed without using bedrails?: A Little Help needed moving to and from a bed to a chair (including a wheelchair)?: None Help needed standing up from a chair using your arms (e.g., wheelchair or bedside chair)?: None Help needed to walk in hospital room?: None Help needed climbing 3-5 steps with a railing? : None 6 Click Score: 23    End of Session   Activity Tolerance: Patient tolerated treatment well Patient left: in chair;with call bell/phone within reach Nurse Communication:  Mobility status;Precautions PT Visit Diagnosis: Other abnormalities of gait and mobility (R26.89)     Time: 8003-4917 PT Time Calculation (min) (ACUTE ONLY): 19 min  Charges:  $Gait Training: 8-22 mins                     11/26/2018  Donnella Sham, PT Acute Rehabilitation Services [phone]  (pager) [phone]  (office)   Tessie Fass Giani Betzold 11/26/2018, 4:58 PM

## 2018-11-26 NOTE — Discharge Instructions (Signed)
YOUR CARDIOLOGY TEAM HAS ARRANGED FOR AN E-VISIT FOR YOUR APPOINTMENT - PLEASE REVIEW IMPORTANT INFORMATION BELOW SEVERAL DAYS PRIOR TO YOUR APPOINTMENT  Due to the recent COVID-19 pandemic, we are transitioning in-person office visits to tele-medicine visits in an effort to decrease unnecessary exposure to our patients, their families, and staff. These visits are billed to your insurance just like a normal visit is. We also encourage you to sign up for MyChart if you have not already done so. You will need a smartphone if possible. For patients that do not have this, we can still complete the visit using a regular telephone but do prefer a smartphone to enable video when possible. You may have a family member that lives with you that can help. If possible, we also ask that you have a blood pressure cuff and scale at home to measure your blood pressure, heart rate and weight prior to your scheduled appointment. Patients with clinical needs that need an in-person evaluation and testing will still be able to come to the office if absolutely necessary. If you have any questions, feel free to call our office.     YOUR PROVIDER WILL BE USING THE FOLLOWING PLATFORM TO COMPLETE YOUR VISIT: TELEPHONE   IF USING MYCHART - How to Download the MyChart App to Your SmartPhone   - If Apple, go to [employer] and type in MyChart in the search bar and download the app. If Android, ask patient to go to [employer] and type in [place] in the search bar and download the app. The app is free but as with any other app downloads, your phone may require you to verify saved payment information or Apple/Android password.  - You will need to then log into the app with your MyChart username and password, and select [facility] as your healthcare provider to link the account.  - When it is time for your visit, go to the MyChart app, find appointments, and click Begin Video Visit. Be sure to Select Allow for your device to  access the Microphone and Camera for your visit. You will then be connected, and your provider will be with you shortly.  **If you have any issues connecting or need assistance, please contact MyChart service desk [phone])**  **If using a computer, in order to ensure the best quality for your visit, you will need to use either of the following Internet Browsers: Insurance underwriter or Microsoft Edge**   IF USING DOXIMITY or [email or web address] - The staff will give you instructions on receiving your link to join the meeting the day of your visit.      2-3 DAYS BEFORE YOUR APPOINTMENT  You will receive a telephone call from one of our [place] team members - your caller ID may say "Unknown caller." If this is a video visit, we will walk you through how to get the video launched on your phone. We will remind you check your blood pressure, heart rate and weight prior to your scheduled appointment. If you have an Apple Watch or Kardia, please upload any pertinent ECG strips the day before or morning of your appointment to [place]. Our staff will also make sure you have reviewed the consent and agree to move forward with your scheduled tele-health visit.     THE DAY OF YOUR APPOINTMENT  Approximately 15 minutes prior to your scheduled appointment, you will receive a telephone call from one of [facility] team - your caller ID may say "Unknown caller."  Our staff will confirm medications, vital signs for the day and any symptoms you may be experiencing. Please have this information available prior to the time of visit start. It may also be helpful for you to have a pad of paper and pen handy for any instructions given during your visit. They will also walk you through joining the smartphone meeting if this is a video visit.    CONSENT FOR TELE-HEALTH VISIT - PLEASE REVIEW  I hereby voluntarily request, consent and authorize [facility] and its employed or contracted physicians, physician  assistants, nurse practitioners or other licensed health care professionals (the Practitioner), to provide me with telemedicine health care services (the Services") as deemed necessary by the treating Practitioner. I acknowledge and consent to receive the Services by the Practitioner via telemedicine. I understand that the telemedicine visit will involve communicating with the Practitioner through live audiovisual communication technology and the disclosure of certain medical information by electronic transmission. I acknowledge that I have been given the opportunity to request an in-person assessment or other available alternative prior to the telemedicine visit and am voluntarily participating in the telemedicine visit.  I understand that I have the right to withhold or withdraw my consent to the use of telemedicine in the course of my care at any time, without affecting my right to future care or treatment, and that the Practitioner or I may terminate the telemedicine visit at any time. I understand that I have the right to inspect all information obtained and/or recorded in the course of the telemedicine visit and may receive copies of available information for a reasonable fee.  I understand that some of the potential risks of receiving the Services via telemedicine include:   Delay or interruption in medical evaluation due to technological equipment failure or disruption;  Information transmitted may not be sufficient (e.g. poor resolution of images) to allow for appropriate medical decision making by the Practitioner; and/or   In rare instances, security protocols could fail, causing a breach of personal health information.  Furthermore, I acknowledge that it is my responsibility to provide information about my medical history, conditions and care that is complete and accurate to the best of my ability. I acknowledge that Practitioner's advice, recommendations, and/or decision may be based on  factors not within their control, such as incomplete or inaccurate data provided by me or distortions of diagnostic images or specimens that may result from electronic transmissions. I understand that the practice of medicine is not an exact science and that Practitioner makes no warranties or guarantees regarding treatment outcomes. I acknowledge that I will receive a copy of this consent concurrently upon execution via email to the email address I last provided but may also request a printed copy by calling the office of [facility].    I understand that my insurance will be billed for this visit.   I have read or had this consent read to me.  I understand the contents of this consent, which adequately explains the benefits and risks of the Services being provided via telemedicine.   I have been provided ample opportunity to ask questions regarding this consent and the Services and have had my questions answered to my satisfaction.  I give my informed consent for the services to be provided through the use of telemedicine in my medical care  By participating in this telemedicine visit I agree to the above.   Aspirin and Your Heart  Aspirin is a medicine that prevents the cells  in the blood that are used for clotting, called platelets, from sticking together. Aspirin can be used to help reduce the risk of blood clots, heart attacks, and other heart-related problems. Can I take aspirin? Your health care provider will help you determine whether it is safe and beneficial for you to take aspirin daily. Taking aspirin daily may be helpful if you:  Have had a heart attack or chest pain.  Are at risk for a heart attack.  Have undergone open-heart surgery, such as coronary artery bypass surgery (CABG).  Have had coronary angioplasty or a stent.  Have had certain types of stroke or transient ischemic attack (TIA).  Have peripheral artery disease (PAD).  Have chronic heart rhythm problems  such as atrial fibrillation and cannot take an anticoagulant.  Have valve disease or have had surgery on a valve. What are the risks? Daily use of aspirin can cause side effects. Some of these include:  Bleeding. Bleeding problems can be minor or serious. An example of a minor problem is a cut that does not stop bleeding. An example of a more serious problem is stomach bleeding or, rarely, bleeding into the brain. Your risk of bleeding is increased if you are also taking non-steroidal anti-inflammatory drugs (NSAIDs).  Increased bruising.  Upset stomach.  An allergic reaction. People who have nasal polyps have an increased risk of developing an aspirin allergy. General guidelines  Take aspirin only as told by your health care provider. Make sure that you understand how much you should take and what form you should take. The two forms of aspirin are: ? Non-enteric-coated.This type of aspirin does not have a coating and is absorbed quickly. This type of aspirin also comes in a chewable form. ? Enteric-coated. This type of aspirin has a coating that releases the medicine very slowly. Enteric-coated aspirin might cause less stomach upset than non-enteric-coated aspirin. This type of aspirin should not be chewed or crushed.  Limit alcohol intake to no more than 1 drink a day for nonpregnant women and 2 drinks a day for men. Drinking alcohol increases your risk of bleeding. One drink equals 12 oz of beer, 5 oz of wine, or 1 oz of hard liquor. Contact a health care provider if you:  Have unusual bleeding or bruising.  Have stomach pain or nausea.  Have ringing in your ears.  Have an allergic reaction that causes: ? Hives. ? Itchy skin. ? Swelling of the lips, tongue, or face. Get help right away if you:  Notice that your bowel movements are bloody, dark red, or black in color.  Vomit or cough up blood.  Have blood in your urine.  Cough, have noisy breathing (wheeze), or feel short  of breath.  Have chest pain, especially if the pain spreads to the arms, back, neck, or jaw.  Have a severe headache, or a headache with confusion, or dizziness. These symptoms may represent a serious problem that is an emergency. Do not wait to see if the symptoms will go away. Get medical help right away. Call your local emergency services (911 in the [place] Do not drive yourself to the hospital. Summary  Aspirin can be used to help reduce the risk of blood clots, heart attacks, and other heart-related problems.  Daily use of aspirin can increase your risk of side effects. Your health care provider will help you determine whether it is safe and beneficial for you to take aspirin daily.  Take aspirin only as told by your health  care provider. Make sure that you understand how much you can take and what form you can take. This information is not intended to replace advice given to you by your health care provider. Make sure you discuss any questions you have with your health care provider. Document Released: 06/29/2008 Document Revised: 05/17/2017 Document Reviewed: 05/17/2017 Elsevier Interactive Patient Education  2019 [employer].

## 2018-11-26 NOTE — Care Management Important Message (Signed)
Important Message  Patient Details  Name: Keith Lucero. MRN: [id number] Date of Birth: Aug 09, 1945   Medicare Important Message Given:  Yes    Orbie Pyo 11/26/2018, 2:33 PM

## 2018-11-26 NOTE — Progress Notes (Signed)
Patient seems uncertain about pacing wires being pulled, appears to have a misunderstanding of the need or use of EPW. This RN explained several times the use and need for these wires and the order to discontinue them. Patient in agreeince with removing them. EPW pulled per protocol and as ordered. All ends intact. Light resistance met with left Ventrical wire. Patient reminded to lie supine approximately one hour. Heart rate 65 on monitor and bp 15/78will monitor patient. Cloer, Kristin Jessup rN  

## 2018-11-26 NOTE — Progress Notes (Signed)
Progress Note  Patient Name: Keith Lucero. Date of Encounter: 11/26/2018  Primary Cardiologist: Peter Martinique, MD   Subjective   Expected chest soreness.  No shortness of breath or other complaints.  Inpatient Medications    Scheduled Meds: . acetaminophen  1,000 mg Oral Q6H   Or  . acetaminophen (TYLENOL) oral liquid 160 mg/5 mL  1,000 mg Per Tube Q6H  . amLODipine  10 mg Oral Daily  . aspirin EC  325 mg Oral Daily   Or  . aspirin  324 mg Per Tube Daily  . atorvastatin  80 mg Oral q1800  . hydrochlorothiazide  12.5 mg Oral Daily  . insulin aspart  0-15 Units Subcutaneous TID WC  . lisinopril  40 mg Oral Daily  . mouth rinse  15 mL Mouth Rinse BID  . metFORMIN  750 mg Oral QHS  . metoprolol tartrate  25 mg Oral BID   Or  . metoprolol tartrate  25 mg Per Tube BID  . pantoprazole  40 mg Oral Daily  . potassium chloride  20 mEq Oral Daily  . sodium chloride flush  3 mL Intravenous Q12H   Continuous Infusions: . sodium chloride     PRN Meds: sodium chloride, alum & mag hydroxide-simeth, bisacodyl **OR** bisacodyl, magnesium hydroxide, metoprolol tartrate, ondansetron (ZOFRAN) IV, oxyCODONE, sodium chloride flush, traMADol   Vital Signs    Vitals:   11/26/18 1115 11/26/18 1130 11/26/18 1200 11/26/18 1259  BP: (!) 163/85 140/72 (!) 153/71 134/66  Pulse: 66 64 64 67  Resp: 20 19 18  (!) 22  Temp:      TempSrc:      SpO2: 97% 96% 96% 98%  Weight:      Height:        Intake/Output Summary (Last 24 hours) at 11/26/2018 1426 Last data filed at 11/26/2018 1212 Gross per 24 hour  Intake 120 ml  Output 1075 ml  Net -955 ml   Last 3 Weights 11/26/2018 11/25/2018 11/23/2018  Weight (lbs) 166 lb 14.4 oz 171 lb 3.2 oz 168 lb 14.4 oz  Weight (kg) 75.705 kg 77.656 kg 76.613 kg      Telemetry    Normal sinus rhythm with occasional PVCs - Personally Reviewed   Physical Exam  Alert, oriented, elderly male in no distress GEN: No acute distress.   Neck: No JVD  Cardiac: RRR, no murmurs, rubs, or gallops.  Respiratory: Clear to auscultation bilaterally. GI: Soft, nontender, non-distended  MS:  1+ bilateral pretibial edema; No deformity. Neuro:  Nonfocal  Psych: Normal affect   Labs    Chemistry Recent Labs  Lab 11/23/18 0438 11/24/18 0243 11/25/18 0320  NA 136 136 139  K 4.2 3.7 3.8  CL 106 105 107  CO2 24 24 23   GLUCOSE 131* 129* 109*  BUN 21 22 17   CREATININE 1.08 0.99 0.91  CALCIUM 7.9* 8.0* 8.0*  GFRNONAA >60 >60 >60  GFRAA >60 >60 >60  ANIONGAP 6 7 9      Hematology Recent Labs  Lab 11/23/18 0438 11/24/18 0243 11/25/18 0320  WBC 12.4* 8.3 6.5  RBC 3.72* 3.58* 3.62*  HGB 11.0* 10.8* 10.7*  HCT 33.5* 31.4* 32.0*  MCV 90.1 87.7 88.4  MCH 29.6 30.2 29.6  MCHC 32.8 34.4 33.4  RDW 12.9 12.7 12.7  PLT 94* 89* 101*    Cardiac EnzymesNo results for input(s): TROPONINI in the last 168 hours. No results for input(s): TROPIPOC in the last 168 hours.   BNPNo results  for input(s): BNP, PROBNP in the last 168 hours.   DDimer No results for input(s): DDIMER in the last 168 hours.   Radiology    No results found.  Cardiac Studies   2D echocardiogram 11/19/2018:  1. Mild hypokinesis of the left ventricular, mid-apical anteroseptal wall, anterior wall and anterolateral wall.  2. Moderate hypokinesis of the left ventricular, entire apical segment.  3. The left ventricle has low normal systolic function, with an ejection fraction of 50-55%. The cavity size was normal. There is mild concentric left ventricular hypertrophy. Left ventricular diastolic Doppler parameters are consistent with impaired  relaxation.  4. The right ventricle has normal systolic function. The cavity was normal. There is no increase in right ventricular wall thickness. Right ventricular systolic pressure could not be assessed.  5. Left atrial size was not assessed.  6. Small to moderate pericardial effusion.  7. The pericardial effusion is  circumferential.  8. The mitral valve is grossly normal. Mild calcification of the mitral valve leaflet.  9. The aortic valve is tricuspid. Mild thickening of the aortic valve. Moderate calcification of the aortic valve. Aortic valve regurgitation is trivial by color flow Doppler.  Cardiac catheterization 11/19/2018: Conclusion     Prox RCA to Mid RCA lesion is 100% stenosed.  Ost 1st Mrg to 1st Mrg lesion is 70% stenosed.  1st Mrg lesion is 70% stenosed.  Mid Cx to Dist Cx lesion is 30% stenosed.  Dist LM to Prox LAD lesion is 40% stenosed.  Ost 1st Sept lesion is 90% stenosed.  Prox LAD lesion is 95% stenosed.  Ost 2nd Diag to 2nd Diag lesion is 80% stenosed.  Mid LAD to Dist LAD lesion is 85% stenosed.  Ost Cx to Prox Cx lesion is 50% stenosed.  The left ventricular systolic function is normal.  LV end diastolic pressure is low.   Severe multivessel CAD with evidence for coronary calcification.    The left main is short and immediately bifurcates into a large LAD and dominant left circumflex vessel.    The LAD has diffuse 40% very proximal stenosis to the first septal perforating artery.  There is 90% stenosis in this large first septal followed by 95% stenosis in the LAD arising at sharp angle after the septal perforating artery.  The diagonal vessel has 80% mid stenosis.  The LAD beyond the diagonal in the mid LAD is 60% stenosed.  There is diffuse 80 to 85% mid distal LAD stenosis extending almost to the apex.  The left circumflex is a dominant vessel that has proximal stenosis of 50% and gives rise to a large first marginal branch which extends to the apex.  There is a long stent in the proximal portion of this marginal vessel with diffuse 70% in-stent narrowing and additional 70% narrowing beyond the stented segment.  The distal circumflex has 30% narrowing prior to giving rise to the smallPDA/ PLA vessels.  The RCA is a small totally occluded nondominant vessel.   Normal global LV contractility with an EF estimate of approximately 50% with a very small focal mid anterolateral area of hypocontractility and apical hypocontractility.  LVEDP is 5 mmHg.  RECOMMENDATION: With significant diffuse disease, will initially obtain surgical consultation for consideration of CABG revascularization surgery.  During the procedure the patient was started on intravenous nitroglycerin and was titrated to 20 mcg.  Plan for reinstituting heparin therapy 8 hours after TR band is removed.   Patient Profile     73 y.o. male presenting with  non-STEMI found to have severe multivessel coronary artery disease treated with CABG  Assessment & Plan    Non-STEMI: Now status post CABG.  Epicardial pacing wires pulled today.  Patient with diabetes treated appropriately with aspirin, and ACE inhibitor, beta-blocker, and high intensity statin drug.  Considering diabetes and ACS presentation, consider addition of Plavix 75 mg daily at discharge.  He appears to be progressing well.  Will arrange outpatient cardiology follow-up in [place].  [facility] will sign off.   Medication Recommendations:  Add plavix 75 mg daily when safe from post-op perspective Other recommendations (labs, testing, etc):  none Follow up as an outpatient:  Will arrange post-hospital telehealth follow-up visit in [place]  For questions or updates, please contact [facility] Please consult [email or web address] for contact info under        Signed, Sherren Mocha, MD  11/26/2018, 2:26 PM

## 2018-11-26 NOTE — Progress Notes (Signed)
      [address]       [place], [address]             [phone]      5 Days Post-Op Procedure(s) (LRB): CORONARY ARTERY BYPASS GRAFTING (CABG) TIMES FIVE USING LEFT INTERNAL MAMMARY ARTERY AND LEFT SAPHENOUS VEIN HARVESTED ENDOSCOPICALLY (N/A) TRANSESOPHAGEAL ECHOCARDIOGRAM (TEE) (N/A)   Subjective:  No new complaints.  States he feels pretty good.  + ambulation  + BM  Objective: Vital signs in last 24 hours: Temp:  [97.6 F (36.4 C)-98.9 F (37.2 C)] 97.6 F (36.4 C) (04/28 0624) Pulse Rate:  [61-73] 68 (04/28 0816) Cardiac Rhythm: Normal sinus rhythm (04/28 0823) Resp:  [18-23] 18 (04/28 0816) BP: (136-176)/(78-83) 137/81 (04/28 0816) SpO2:  [96 %-99 %] 98 % (04/28 0816) Weight:  [75.7 kg] 75.7 kg (04/28 0624)  Intake/Output from previous day: 04/27 0701 - 04/28 0700 In: 360 [P.O.:360] Out: 1100 [Urine:1100]  General appearance: alert, cooperative and no distress Heart: regular rate and rhythm Lungs: clear to auscultation bilaterally Abdomen: soft, non-tender; bowel sounds normal; no masses,  no organomegaly Extremities: edema trace Wound: clean and dry  Lab Results: Recent Labs    11/24/18 0243 11/25/18 0320  WBC 8.3 6.5  HGB 10.8* 10.7*  HCT 31.4* 32.0*  PLT 89* 101*   BMET:  Recent Labs    11/24/18 0243 11/25/18 0320  NA 136 139  K 3.7 3.8  CL 105 107  CO2 24 23  GLUCOSE 129* 109*  BUN 22 17  CREATININE 0.99 0.91  CALCIUM 8.0* 8.0*    PT/INR: No results for input(s): LABPROT, INR in the last 72 hours. ABG    Component Value Date/Time   PHART 7.383 11/21/2018 1756   HCO3 20.7 11/21/2018 1756   TCO2 22 11/21/2018 1756   ACIDBASEDEF 4.0 (H) 11/21/2018 1756   O2SAT 97.0 11/21/2018 1756   CBG (last 3)  Recent Labs    11/25/18 1738 11/25/18 2150 11/26/18 0622  GLUCAP 145* 124* 138*    Assessment/Plan: S/P Procedure(s) (LRB): CORONARY ARTERY BYPASS GRAFTING (CABG) TIMES FIVE USING LEFT INTERNAL MAMMARY ARTERY AND  LEFT SAPHENOUS VEIN HARVESTED ENDOSCOPICALLY (N/A) TRANSESOPHAGEAL ECHOCARDIOGRAM (TEE) (N/A)  1. CV- NSR, BP remains elevated at times- continue Lopressor, Lisinopril, Norvasc, will add HCTZ 2. Pulm- no acute issues, continue IS 3. Renal- creatinine WNL, will d/c lasix as weight is below baseline 4. DM-cbgs controlled, continue Metformin 5. Dispo- patient stable, will d/c EPW today, add HCTZ , continue Lopressor, Norvasc, if remains stable possibly ready for d/c in AM   LOS: 7 days    Ellwood Handler 11/26/2018

## 2018-11-26 NOTE — Progress Notes (Signed)
Patient ambulated in hallway this AM  Will monitor patient. Addyson Traub, Bettina Gavia RN

## 2018-11-26 NOTE — Telephone Encounter (Signed)
   TELEPHONE CALL NOTE  This patient has been deemed a candidate for follow-up tele-health visit to limit community exposure during the Covid-19 pandemic. I spoke with the patient via phone to discuss instructions. This has been outlined on the patient's AVS (dotphrase: [email or web address]). The patient was advised to review the section on consent for treatment as well. The patient will receive a phone call 2-3 days prior to their E-Visit at which time consent will be verbally confirmed. A Virtual Office Visit appointment type has been scheduled for 12/16/18 with Dr. Geraldo Pitter, with "VIDEO" or "TELEPHONE" in the appointment notes - patient prefers TELEPHONE type. Daughter has a smart phone that he could possible use for video visit.  I have either confirmed the patient is active in MyChart or offered to send sign-up link to phone/email via Mychart icon beside patient's photo.  Reino Bellis, NP 11/26/2018 2:52 PM

## 2018-11-27 LAB — GLUCOSE, CAPILLARY: Glucose-Capillary: 166 mg/dL — ABNORMAL HIGH (ref 70–99)

## 2018-11-27 MED ORDER — TRAMADOL HCL 50 MG PO TABS: 50.0000 mg | ORAL_TABLET | Freq: Four times a day (QID) | ORAL | 0 refills | Status: DC | PRN

## 2018-11-27 MED ORDER — HYDROCHLOROTHIAZIDE 12.5 MG PO CAPS: 12.5000 mg | ORAL_CAPSULE | Freq: Every day | ORAL | 1 refills | Status: DC

## 2018-11-27 MED ORDER — CLOPIDOGREL BISULFATE 75 MG PO TABS: 75.0000 mg | ORAL_TABLET | Freq: Every day | ORAL | 11 refills | Status: DC

## 2018-11-27 MED ORDER — ATORVASTATIN CALCIUM 80 MG PO TABS: 80.0000 mg | ORAL_TABLET | Freq: Every day | ORAL | 1 refills | Status: DC

## 2018-11-27 MED ORDER — METOPROLOL TARTRATE 25 MG PO TABS: 25.0000 mg | ORAL_TABLET | Freq: Two times a day (BID) | ORAL | 1 refills | Status: DC

## 2018-11-27 NOTE — Progress Notes (Signed)
Discharge instructions given to daughter Olin Hauser.  Discussed follow up appointment, and new medications.  Discussed signs and symptoms to watch for and when to contact the physician.  Verbalized understanding.

## 2018-11-27 NOTE — Discharge Summary (Signed)
Physician Discharge Summary  Patient ID: Keith Lucero. MRN: [id number] DOB/AGE: 12-27-1945 73 y.o.  Admit date: 11/19/2018 Discharge date: 11/27/2018  Admission Diagnoses: Non-STEMI  Discharge Diagnoses:  Active Problems:   NSTEMI (non-ST elevated myocardial infarction) ([place])   S/P CABG x 5   Patient Active Problem List   Diagnosis Date Noted  . S/P CABG x 5 11/21/2018  . NSTEMI (non-ST elevated myocardial infarction) ([place]) 11/19/2018    Past Medical History:  Diagnosis Date  . Diabetes mellitus without complication ([place])    type 2  . Hypertension     History of the present illness:  The patient is a 73 year old gentleman with a history of multiple cardiac risk factors including type 2 diabetes, hypertension PCI in the past at [facility] regional.  He believes it was in the late 1990s.  The patient has done relatively well for several years but this has been variable with times where he felt quite well but other times quite easily fatigued.  He presented with a 2 to 3-day history of intermittent substernal and left sided chest pain with radiation to the left arm associated with exertion.  Prior to presentation the pain was 10/10 and nitroglycerin led to near complete resolution of his symptoms.  He also had associated nausea and weakness.  He presented to the emergency room where ECG showed sinus rhythm with T wave inversions in the anterolateral leads.  His initial troponin was elevated at 0.30.  This led to cardiology evaluation including cardiac catheterization which showed severe three-vessel disease with ejection fraction of 50% with focal mid anterolateral and apical hypokinesis, LVEDP was 5 mmHg.  He was placed on heparin and nitroglycerin and cardiothoracic surgery consultation was obtained and he was felt to be a suitable candidate to proceed with CABG.  Discharged Condition: good  Hospital Course: The patient was medically stabilized and on 11/21/2018 he was taken to  the operating room where you underwent the below described procedure.  He tolerated it well was taken to the surgical intensive care unit in stable condition.   Postoperative hospital course:  Patient has done well.  He has maintained stable hemodynamics.  He is on multiple medications for hypertension and his blood pressure control has improved with time.  He may require further management as an outpatient.  He was weaned from the ventilator without difficulty.  All routine lines, monitors and drainage devices were discontinued in the standard fashion.  Blood sugars have been stable on preoperative dosing of metformin.  He has an expected acute blood loss anemia and values are stable.  Renal function is within normal limits.  Incisions are noted to be healing well without evidence of infection.  He is tolerating gradually increasing activities using standard protocols.  He has been evaluated by physical therapy and doing well in that regard.  Oxygen has been weaned and he maintains good saturations on room air.  He has maintained sinus rhythm.  At the time of discharge the patient is felt to be quite stable.  Consults: cardiology  Significant Diagnostic Studies: routine post op labs and serial CXR's, cardiac catheterization  Treatments: surgery:  CARDIOVASCULAR SURGERY OPERATIVE NOTE  11/21/2018  Surgeon:  Gaye Pollack, MD  First Assistant: Jadene Pierini,  PA-C   Preoperative Diagnosis:  Severe multi-vessel coronary artery disease   Postoperative Diagnosis:  Same   Procedure:  1. Median Sternotomy 2. Extracorporeal circulation 3.   Coronary artery bypass grafting x 5   Left internal mammary graft to the LAD  SVG to diagonal  Sequential SVG to OM2 and PDA (LCX)  SVG to OM1 4.   Endoscopic vein harvest from the left leg   Anesthesia:  General Endotracheal   Clinical  History/Surgical Indication: Discharge Exam: Blood pressure 123/79, pulse 77, temperature 99.3 F (37.4 C), temperature source Oral, resp. rate (!) 24, height 5\' 9"  (1.753 m), weight 73.6 kg, SpO2 98 %.  General appearance: alert, cooperative and no distress Heart: regular rate and rhythm Lungs: clear to auscultation bilaterally Abdomen: benign Extremities: no edema Wound: incis healing well Disposition: Discharge disposition: 01-Home or Self Care       Discharge Instructions    Amb Referral to Cardiac Rehabilitation   Complete by:  As directed    Referring to [place] CRP 2   Diagnosis:   CABG NSTEMI     CABG X ___:  5   Discharge patient   Complete by:  As directed    Discharge disposition:  01-Home or Self Care   Discharge patient date:  11/27/2018     Allergies as of 11/27/2018      Reactions   Penicillins Itching      Medication List    STOP taking these medications   diphenoxylate-atropine 2.5-0.025 MG tablet Commonly known as:  LOMOTIL     TAKE these medications   amLODipine 10 MG tablet Commonly known as:  NORVASC Take 10 mg by mouth daily.   aspirin EC 81 MG tablet Take 81 mg by mouth daily.   atorvastatin 80 MG tablet Commonly known as:  LIPITOR Take 1 tablet (80 mg total) by mouth daily at 6 PM.   clopidogrel 75 MG tablet Commonly known as:  Plavix Take 1 tablet (75 mg total) by mouth daily.   FeroSul 325 (65 FE) MG tablet Generic drug:  ferrous sulfate Take 325 mg by mouth at bedtime.   hydrochlorothiazide 12.5 MG capsule Commonly known as:  MICROZIDE Take 1 capsule (12.5 mg total) by mouth daily.   lisinopril 40 MG tablet Commonly known as:  ZESTRIL Take 40 mg by mouth daily.   metFORMIN 750 MG 24 hr tablet Commonly known as:  GLUCOPHAGE-XR Take 750 mg by mouth at bedtime.   metoprolol tartrate 25 MG tablet Commonly known as:  LOPRESSOR Take 1 tablet (25 mg total) by mouth 2 (two) times daily.   omeprazole 20 MG  capsule Commonly known as:  PRILOSEC Take 20 mg by mouth every morning.   traMADol 50 MG tablet Commonly known as:  ULTRAM Take 1 tablet (50 mg total) by mouth every 6 (six) hours as needed for up to 7 days for moderate pain.      Follow-up Information    Revankar, Reita Cliche, MD Follow up on 12/16/2018.   Specialty:  Cardiology Why:  at 2:50pm for your follow up appt. This will be set up as a virtual visit through your mobile phone.  Contact information: [address] [place] [address] [phone]        Gaye Pollack, MD Follow up.   Specialty:  Cardiothoracic Surgery Why:  The office will contact you about  appointments. On the day you see Dr Cyndia Bent, obtain a Chest Xray at [facility] 1/2 hour prior to appointment. It's in the same office complex Contact information: [address] [place] [address] [phone]         The patient has been discharged on:   1.Beta Blocker:  Yes [ y  ]                              No   [   ]                              If No, reason:  2.Ace Inhibitor/ARB: Yes [ y  ]                                     No  [   ]                                     If No, reason:  3.Statin:   Yes [ y  ]                  No  [   ]                  If No, reason:  4.Ecasa:  Yes  [ y  ]                  No   [   ]                  If No, reason:  Signed: [name] 11/27/2018, 9:19 AM

## 2018-11-27 NOTE — Progress Notes (Addendum)
[address]       [place] [address]             [phone]      6 Days Post-Op Procedure(s) (LRB): CORONARY ARTERY BYPASS GRAFTING (CABG) TIMES FIVE USING LEFT INTERNAL MAMMARY ARTERY AND LEFT SAPHENOUS VEIN HARVESTED ENDOSCOPICALLY (N/A) TRANSESOPHAGEAL ECHOCARDIOGRAM (TEE) (N/A) Subjective: Feels well, no new c/o  Objective: Vital signs in last 24 hours: Temp:  [98 F (36.7 C)-99.3 F (37.4 C)] 99.3 F (37.4 C) (04/29 0343) Pulse Rate:  [64-77] 77 (04/29 0343) Cardiac Rhythm: Normal sinus rhythm (04/28 2000) Resp:  [17-24] 24 (04/29 0343) BP: (123-163)/(61-88) 123/79 (04/29 0343) SpO2:  [96 %-98 %] 98 % (04/29 0343) Weight:  [73.6 kg] 73.6 kg (04/29 0343)  Hemodynamic parameters for last 24 hours:    Intake/Output from previous day: 04/28 0701 - 04/29 0700 In: 520 [P.O.:520] Out: 775 [Urine:775] Intake/Output this shift: No intake/output data recorded.  General appearance: alert, cooperative and no distress Heart: regular rate and rhythm Lungs: clear to auscultation bilaterally Abdomen: benign Extremities: no edema Wound: incis healing well  Lab Results: Recent Labs    11/25/18 0320  WBC 6.5  HGB 10.7*  HCT 32.0*  PLT 101*   BMET:  Recent Labs    11/25/18 0320  NA 139  K 3.8  CL 107  CO2 23  GLUCOSE 109*  BUN 17  CREATININE 0.91  CALCIUM 8.0*    PT/INR: No results for input(s): LABPROT, INR in the last 72 hours. ABG    Component Value Date/Time   PHART 7.383 11/21/2018 1756   HCO3 20.7 11/21/2018 1756   TCO2 22 11/21/2018 1756   ACIDBASEDEF 4.0 (H) 11/21/2018 1756   O2SAT 97.0 11/21/2018 1756   CBG (last 3)  Recent Labs    11/26/18 1608 11/26/18 2119 11/27/18 0610  GLUCAP 177* 133* 166*    Meds Scheduled Meds: . amLODipine  10 mg Oral Daily  . aspirin EC  325 mg Oral Daily   Or  . aspirin  324 mg Per Tube Daily  . atorvastatin  80 mg Oral q1800  . hydrochlorothiazide  12.5 mg Oral Daily  . insulin  aspart  0-15 Units Subcutaneous TID WC  . lisinopril  40 mg Oral Daily  . mouth rinse  15 mL Mouth Rinse BID  . metFORMIN  750 mg Oral QHS  . metoprolol tartrate  25 mg Oral BID   Or  . metoprolol tartrate  25 mg Per Tube BID  . pantoprazole  40 mg Oral Daily  . potassium chloride  20 mEq Oral Daily  . sodium chloride flush  3 mL Intravenous Q12H   Continuous Infusions: . sodium chloride     PRN Meds:.sodium chloride, alum & mag hydroxide-simeth, bisacodyl **OR** bisacodyl, magnesium hydroxide, metoprolol tartrate, ondansetron (ZOFRAN) IV, oxyCODONE, sodium chloride flush, traMADol  Xrays No results found.  Assessment/Plan: S/P Procedure(s) (LRB): CORONARY ARTERY BYPASS GRAFTING (CABG) TIMES FIVE USING LEFT INTERNAL MAMMARY ARTERY AND LEFT SAPHENOUS VEIN HARVESTED ENDOSCOPICALLY (N/A) TRANSESOPHAGEAL ECHOCARDIOGRAM (TEE) (N/A)  1 conts to do well, stable for discharge 2 hemodyn stable in sinus, some increased BP but improved, back on home htn meds- cont same for now 3 add plavix per Dr Burt Knack recs 4 sugars adeq controlled on metformin- will need cont out patient management of diabetes long term 5 No other new labs  LOS: 8 days    [name] [facility] 11/27/2018 Pager [phone]  Chart reviewed, patient examined, agree with above. He is doing well. Plan home today with his grandson and daughter.

## 2018-11-27 NOTE — Plan of Care (Signed)
Keith Lucero is adequate for discharge.

## 2018-11-27 NOTE — Progress Notes (Signed)
Late Note for PT in late morning.  Overall improved stability, stamina, balance and following precautions,.    11/27/18 1216  PT Visit Information  Last PT Received On 11/27/18  Assistance Needed +1  History of Present Illness Keith Lucero. is a 73 y.o. male with CAD, hypertension, and type 2 diabetes mellitus, who presents to the [facility] ED for evaluation of chest pain.  pt found to have multivessel coronary disease s/p NSTEMI.  4/23 s/p CABG x5.  Subjective Data  Patient Stated Goal Home when able  Precautions  Precautions Fall;Sternal  Pain Assessment  Pain Assessment Faces  Faces Pain Scale 2  Pain Location sternal  Pain Intervention(s) Monitored during session  Cognition  Arousal/Alertness Awake/alert  Behavior During Therapy WFL for tasks assessed/performed  Overall Cognitive Status Within Functional Limits for tasks assessed  Bed Mobility  Bed Mobility Sidelying to Sit;Rolling  Rolling Supervision  Sidelying to sit Supervision  General bed mobility comments pt was able to use LE's to build momentum with very little use of his UE's  Transfers  Overall transfer level Needs assistance  Transfers Sit to/from Stand  Sit to Stand Modified independent (Device/Increase time)  General transfer comment Reinforced sternal precautions as related to standing and sitting  Ambulation/Gait  Ambulation/Gait assistance Independent;Modified independent (Device/Increase time) (in home environment)  Gait Distance (Feet) 300 Feet  Assistive device None  Gait Pattern/deviations Step-through pattern  General Gait Details steady even with challenges to balance including scanning, abrupt changes in speed and direction, backing up and stairs.  Gait velocity moderate  Gait velocity interpretation 1.31 - 2.62 ft/sec, indicative of limited community ambulator  Stairs Yes  Stairs assistance Modified independent (Device/Increase time)  Stair Management One rail Left;Alternating  pattern;Forwards  Number of Stairs 12  General stair comments steady with rail  Balance  Sitting balance-Leahy Scale Good  Standing balance-Leahy Scale Good  General Comments  General comments (skin integrity, edema, etc.) vss  PT - End of Session  Activity Tolerance Patient tolerated treatment well  Patient left in chair;with call bell/phone within reach  Nurse Communication Mobility status;Precautions   PT - Assessment/Plan  PT Plan Current plan remains appropriate  PT Visit Diagnosis Other abnormalities of gait and mobility (R26.89)  PT Frequency (ACUTE ONLY) Min 3X/week  Follow Up Recommendations Home health PT;Supervision - Intermittent  PT equipment None recommended by PT  AM-PAC PT "6 Clicks" Mobility Outcome Measure (Version 2)  Help needed turning from your back to your side while in a flat bed without using bedrails? 4  Help needed moving from lying on your back to sitting on the side of a flat bed without using bedrails? 4  Help needed moving to and from a bed to a chair (including a wheelchair)? 4  Help needed standing up from a chair using your arms (e.g., wheelchair or bedside chair)? 4  Help needed to walk in hospital room? 4  Help needed climbing 3-5 steps with a railing?  4  6 Click Score 24  Consider Recommendation of Discharge To: Home with no services  PT Goal Progression  Progress towards PT goals Progressing toward goals  Acute Rehab PT Goals  PT Goal Formulation With patient  Time For Goal Achievement 11/30/18  Potential to Achieve Goals Good  PT Time Calculation  PT Start Time (ACUTE ONLY) 1125  PT Stop Time (ACUTE ONLY) 1136  PT Time Calculation (min) (ACUTE ONLY) 11 min  PT General Charges  $$ ACUTE PT VISIT 1 Visit  PT Treatments  $Gait Training 8-22 mins   11/27/2018  Donnella Sham, PT Acute Rehabilitation Services [phone]  (pager) [phone]  (office)

## 2018-11-27 NOTE — Plan of Care (Signed)
  Problem: Education: °Goal: Knowledge of General Education information will improve °Description: Including pain rating scale, medication(s)/side effects and non-pharmacologic comfort measures °Outcome: Progressing °  °Problem: Clinical Measurements: °Goal: Postoperative complications will be avoided or minimized °Outcome: Progressing °  °

## 2018-11-27 NOTE — Plan of Care (Signed)
Care plans reviewed and patient is progressing.  

## 2018-12-04 ENCOUNTER — Ambulatory Visit (INDEPENDENT_AMBULATORY_CARE_PROVIDER_SITE_OTHER): Payer: Self-pay

## 2018-12-04 ENCOUNTER — Other Ambulatory Visit: Payer: Self-pay

## 2018-12-04 VITALS — Temp 97.7°F

## 2018-12-04 DIAGNOSIS — Z951 Presence of aortocoronary bypass graft: Secondary | ICD-10-CM

## 2018-12-04 DIAGNOSIS — G8918 Other acute postprocedural pain: Secondary | ICD-10-CM

## 2018-12-04 DIAGNOSIS — Z4802 Encounter for removal of sutures: Secondary | ICD-10-CM

## 2018-12-04 MED ORDER — TRAMADOL HCL 50 MG PO TABS: 50.0000 mg | ORAL_TABLET | Freq: Four times a day (QID) | ORAL | 0 refills | Status: DC | PRN

## 2018-12-12 ENCOUNTER — Telehealth: Payer: Self-pay | Admitting: Cardiology

## 2018-12-12 NOTE — Telephone Encounter (Signed)
YOUR CARDIOLOGY TEAM HAS ARRANGED FOR AN E-VISIT FOR YOUR APPOINTMENT - PLEASE REVIEW IMPORTANT INFORMATION BELOW SEVERAL DAYS PRIOR TO YOUR APPOINTMENT  Due to the recent COVID-19 pandemic, we are transitioning in-person office visits to tele-medicine visits in an effort to decrease unnecessary exposure to our patients, their families, and staff. These visits are billed to your insurance just like a normal visit is. We also encourage you to sign up for MyChart if you have not already done so. You will need a smartphone if possible. For patients that do not have this, we can still complete the visit using a regular telephone but do prefer a smartphone to enable video when possible. You may have a family member that lives with you that can help. If possible, we also ask that you have a blood pressure cuff and scale at home to measure your blood pressure, heart rate and weight prior to your scheduled appointment. Patients with clinical needs that need an in-person evaluation and testing will still be able to come to the office if absolutely necessary. If you have any questions, feel free to call our office.     YOUR PROVIDER WILL BE USING THE FOLLOWING PLATFORM TO COMPLETE YOUR VISIT: Staff: Please delete this text and fill in MyChart/[email or web address]   IF USING MYCHART - How to Download the MyChart App to Your SmartPhone   - If Apple, go to [employer] and type in MyChart in the search bar and download the app. If Android, ask patient to go to [employer] and type in Happy in the search bar and download the app. The app is free but as with any other app downloads, your phone may require you to verify saved payment information or Apple/Android password.  - You will need to then log into the app with your MyChart username and password, and select [facility] as your healthcare provider to link the account.  - When it is time for your visit, go to the MyChart app, find appointments, and click Begin  Video Visit. Be sure to Select Allow for your device to access the Microphone and Camera for your visit. You will then be connected, and your provider will be with you shortly.  **If you have any issues connecting or need assistance, please contact MyChart service desk [phone])**  **If using a computer, in order to ensure the best quality for your visit, you will need to use either of the following Internet Browsers: Insurance underwriter or Microsoft Edge**   IF USING DOXIMITY or [email or web address].ME - The staff will give you instructions on receiving your link to join the meeting the day of your visit.      2-3 DAYS BEFORE YOUR APPOINTMENT  You will receive a telephone call from one of our [facility] team members - your caller ID may say "Unknown caller." If this is a video visit, we will walk you through how to get the video launched on your phone. We will remind you check your blood pressure, heart rate and weight prior to your scheduled appointment. If you have an Apple Watch or Kardia, please upload any pertinent ECG strips the day before or morning of your appointment to [facility]. Our staff will also make sure you have reviewed the consent and agree to move forward with your scheduled tele-health visit.     THE DAY OF YOUR APPOINTMENT  Approximately 15 minutes prior to your scheduled appointment, you will receive a telephone call from one of [facility] team -  your caller ID may say "Unknown caller."  Our staff will confirm medications, vital signs for the day and any symptoms you may be experiencing. Please have this information available prior to the time of visit start. It may also be helpful for you to have a pad of paper and pen handy for any instructions given during your visit. They will also walk you through joining the smartphone meeting if this is a video visit.    CONSENT FOR TELE-HEALTH VISIT - PLEASE REVIEW  I hereby voluntarily request, consent and authorize [facility] and  its employed or contracted physicians, physician assistants, nurse practitioners or other licensed health care professionals (the Practitioner), to provide me with telemedicine health care services (the Services") as deemed necessary by the treating Practitioner. I acknowledge and consent to receive the Services by the Practitioner via telemedicine. I understand that the telemedicine visit will involve communicating with the Practitioner through live audiovisual communication technology and the disclosure of certain medical information by electronic transmission. I acknowledge that I have been given the opportunity to request an in-person assessment or other available alternative prior to the telemedicine visit and am voluntarily participating in the telemedicine visit.  I understand that I have the right to withhold or withdraw my consent to the use of telemedicine in the course of my care at any time, without affecting my right to future care or treatment, and that the Practitioner or I may terminate the telemedicine visit at any time. I understand that I have the right to inspect all information obtained and/or recorded in the course of the telemedicine visit and may receive copies of available information for a reasonable fee.  I understand that some of the potential risks of receiving the Services via telemedicine include:   Delay or interruption in medical evaluation due to technological equipment failure or disruption;  Information transmitted may not be sufficient (e.g. poor resolution of images) to allow for appropriate medical decision making by the Practitioner; and/or   In rare instances, security protocols could fail, causing a breach of personal health information.  Furthermore, I acknowledge that it is my responsibility to provide information about my medical history, conditions and care that is complete and accurate to the best of my ability. I acknowledge that Practitioner's advice,  recommendations, and/or decision may be based on factors not within their control, such as incomplete or inaccurate data provided by me or distortions of diagnostic images or specimens that may result from electronic transmissions. I understand that the practice of medicine is not an exact science and that Practitioner makes no warranties or guarantees regarding treatment outcomes. I acknowledge that I will receive a copy of this consent concurrently upon execution via email to the email address I last provided but may also request a printed copy by calling the office of [facility].    I understand that my insurance will be billed for this visit. Pt consents to virtual visit.  I have read or had this consent read to me.  I understand the contents of this consent, which adequately explains the benefits and risks of the Services being provided via telemedicine.   I have been provided ample opportunity to ask questions regarding this consent and the Services and have had my questions answered to my satisfaction.  I give my informed consent for the services to be provided through the use of telemedicine in my medical care  By participating in this telemedicine visit I agree to the above.  COVID-19 Pre-Screening Questions:   In the past 7 to 10 days have you had a cough,  shortness of breath, headache, congestion, fever, body aches, chills, sore throat, or sudden loss of taste or sense of smell?NO  Have you been around anyone with known Covid 19.NO  Have you been around anyone who is awaiting Covid 19 test results in the past 7 to 10 days?NO  Have you been around anyone who has been exposed to Covid 19, or has mentioned symptoms of Covid 19 within the past 7 to 10 days?NO  If you have any concerns about symptoms your patients report please contact your leadership team, or the provider the patient is seeing in the office for further guidance.

## 2018-12-12 NOTE — Telephone Encounter (Signed)
Phoned the listed number to check on patient, no answer, message that voicemail had not been set up.  CMA spoke with patient's daughter to set up virtual visit, she expressed no urgent patient concerns at that time.  Will follow-up tomorrow.

## 2018-12-12 NOTE — Telephone Encounter (Signed)
Called patient to [facility] aware that Visit for 05/18 ws a TELEVISIT and not to come top the office, patient had recent CABG and is not feeling well a all, he states that he tried to tell the surgeon office but the would not listen and he is hurting in his chest really bad and not well, I tried to call the daughter per his request but had to leave a message. I told him I would have a RN call him to check on him.

## 2018-12-13 NOTE — Telephone Encounter (Signed)
Phoned patient who is s/p CABG at [facility] on 11/21/18. He is re-establishing care with Dr. Bettina Gavia via virtual visit on Monday.   reports weakness, insomnia, that his stomach is "tore-up" and having lots of gas, and that he's urinating a lot.     He is short of breath with talking, denies chest pain/pressure or tightness or edema.    Reports last BP 3 days ago was 125/65, HR 65. He does not weigh himself.     Medication list reconciled with Cardiology clinic f/u visit on 12/04/18.    He reports that he can't see or read and can't verify his meds.      Phoned daughter Keith Lucero who states patient reported above symptoms to nurse at post-op visit who said this is normal at this stage post-op. He will start Cardiac Rehab in approximately 2 weeks and not see another provider from surgeon's office until 5/26.       Encouraged daughter to follow-up with surgeon's office this afternoon to update on patient status until we see patient on Monday. Told her I will forward this to MD covering to Dr. Bettina Gavia and asked her to call me back after speaking with surgeon's team.

## 2018-12-13 NOTE — Telephone Encounter (Signed)
Spoke with dtr who states they are following up with PCP. States blood sugars have been 130's-140's.

## 2018-12-13 NOTE — Telephone Encounter (Signed)
Daughter said RN told her to give patient Immodium for stomach issues, possibly be a UTI. Wait and try over weekend, then call doctor on Monday.

## 2018-12-16 ENCOUNTER — Telehealth: Payer: PPO | Admitting: Cardiology

## 2018-12-19 ENCOUNTER — Encounter: Payer: Self-pay | Admitting: Cardiology

## 2018-12-19 ENCOUNTER — Other Ambulatory Visit: Payer: Self-pay

## 2018-12-19 ENCOUNTER — Telehealth (INDEPENDENT_AMBULATORY_CARE_PROVIDER_SITE_OTHER): Payer: PPO | Admitting: Cardiology

## 2018-12-19 DIAGNOSIS — I25118 Atherosclerotic heart disease of native coronary artery with other forms of angina pectoris: Secondary | ICD-10-CM

## 2018-12-19 DIAGNOSIS — E782 Mixed hyperlipidemia: Secondary | ICD-10-CM

## 2018-12-19 DIAGNOSIS — E119 Type 2 diabetes mellitus without complications: Secondary | ICD-10-CM

## 2018-12-19 DIAGNOSIS — I2510 Atherosclerotic heart disease of native coronary artery without angina pectoris: Secondary | ICD-10-CM

## 2018-12-19 DIAGNOSIS — E785 Hyperlipidemia, unspecified: Secondary | ICD-10-CM | POA: Insufficient documentation

## 2018-12-19 DIAGNOSIS — I10 Essential (primary) hypertension: Secondary | ICD-10-CM | POA: Insufficient documentation

## 2018-12-19 HISTORY — DX: Essential (primary) hypertension: I10

## 2018-12-19 HISTORY — DX: Atherosclerotic heart disease of native coronary artery with other forms of angina pectoris: I25.118

## 2018-12-19 HISTORY — DX: Type 2 diabetes mellitus without complications: E11.9

## 2018-12-19 HISTORY — DX: Hyperlipidemia, unspecified: E78.5

## 2018-12-19 NOTE — Patient Instructions (Signed)
Medication Instructions:  Your physician recommends that you continue on your current medications as directed. Please refer to the Current Medication list given to you today.   If you need a refill on your cardiac medications before your next appointment, please call your pharmacy.   Lab work: Your physician recommends that you return for lab work in: [place] CMP,CBC  If you have labs (blood work) drawn today and your tests are completely normal, you will receive your results only by: Marland Kitchen MyChart Message (if you have MyChart) OR . A paper copy in the mail If you have any lab test that is abnormal or we need to change your treatment, we will call you to review the results.  Testing/Procedures: None  Follow-Up: At [facility], you and your health needs are our priority.  As part of our continuing mission to provide you with exceptional heart care, we have created designated Provider Care Teams.  These Care Teams include your primary Cardiologist (physician) and Advanced Practice Providers (APPs -  Physician Assistants and Nurse Practitioners) who all work together to provide you with the care you need, when you need it. You will need a follow up appointment in 3 weeks.   Any Other Special Instructions Will Be Listed Below (If Applicable).

## 2018-12-19 NOTE — Progress Notes (Signed)
Virtual Visit via Telephone Note   This visit type was conducted due to national recommendations for restrictions regarding the COVID-19 Pandemic (e.g. social distancing) in an effort to limit this patient's exposure and mitigate transmission in our community.  Due to his co-morbid illnesses, this patient is at least at moderate risk for complications without adequate follow up.  This format is felt to be most appropriate for this patient at this time.  The patient did not have access to video technology/had technical difficulties with video requiring transitioning to audio format only (telephone).  All issues noted in this document were discussed and addressed.  No physical exam could be performed with this format.  Please refer to the patient's chart for his  consent to telehealth for [facility].   Date:  12/19/2018   ID:  Keith Amble., DOB 19-Dec-1945, MRN [id number]  Patient Location: Home Provider Location: Office  PCP:  Patient, No Pcp Per  Cardiologist:  Shirlee More, MD  Electrophysiologist:  None   Evaluation Performed:  Follow-Up Visit  Chief Complaint:  Recent MI and CABG  History of Present Illness:    Keith Pultz. is a 73 y.o. male with CAD non-ST elevation MI 11/19/2018 and bypass surgery same admission discharge to [facility] 11/27/2018.  He had a history of preceding PCI and stent at [facility] hospital several decades ago.  He had a low level troponin elevated 0.30 and T wave inversions on EKG.  Left heart catheterization showed severe three-vessel CAD ejection fraction 50% with focal mid anterolateral and apical hypokinesia.  CABG was performed 11/21/2018.  Postoperatively he had stable hemodynamics he was weaned from the ventilator without difficulty resumed on his preoperative metformin.  He did not require transfusion he had no arrhythmia and he quickly improved and was discharged from the hospital in sinus rhythm without  complication.  He was discharged from the hospital on dual antiplatelet therapy that we will continue, high intensity statin and antihypertensives including diuretic beta-blocker ACE inhibitor.    Procedure: 1. Median Sternotomy 2. Extracorporeal circulation 3. Coronary artery bypass grafting x 5  Left internal mammary graft to the LAD  SVG to diagonal  Sequential SVG to OM2 and PDA (LCX)  SVG to OM1 4. Endoscopic vein harvest from the leftleg The patient does not have symptoms concerning for COVID-19 infection (fever, chills, cough, or new shortness of breath).   Unfortunately even with prompting and coaching him using 2 formats were unable to establish a video link.  His daughter is present she is a Education administrator and I know the family.  Overall he is doing well he just finds himself in a great deal malaise and fatigue his heart rate and blood pressure are low order to stop his thiazide diuretic reduce his beta-blocker 50% reduce his ACE inhibitor 50% and bring him to the office check metabolic parameters and CBC.  He is not ready to initiate cardiac rehabilitation have asked him to increase his activities at home we will do a follow-up visit in a few weeks I expect a quick recovery no wound drainage fever chills shortness of breath edema or palpitation he has some sternal incisional pain not severe and his daughter will give him Tylenol as needed.  Presently is pulling 1200 cc with incentive spirometry and have encouraged him to continue to use it.  I reviewed his hospital records with the patient including cardiac catheterization bypass records echocardiogram carotid duplex with mild atherosclerosis and  prior to the visit reviewed these records.  His daughter participated in forming a plan of care Past Medical History:  Diagnosis Date  . Diabetes mellitus without complication ([place])    type 2  . Hypertension    Past Surgical History:  Procedure Laterality Date  . CORONARY  ANGIOPLASTY WITH STENT PLACEMENT  1998  . CORONARY ARTERY BYPASS GRAFT N/A 11/21/2018   Procedure: CORONARY ARTERY BYPASS GRAFTING (CABG) TIMES FIVE USING LEFT INTERNAL MAMMARY ARTERY AND LEFT SAPHENOUS VEIN HARVESTED ENDOSCOPICALLY;  Surgeon: Gaye Pollack, MD;  Location: [place];  Service: Open Heart Surgery;  Laterality: N/A;  . LEFT HEART CATH AND CORONARY ANGIOGRAPHY N/A 11/19/2018   Procedure: LEFT HEART CATH AND CORONARY ANGIOGRAPHY;  Surgeon: Troy Sine, MD;  Location: [place] [facility];  Service: Cardiovascular;  Laterality: N/A;  . TEE WITHOUT CARDIOVERSION N/A 11/21/2018   Procedure: TRANSESOPHAGEAL ECHOCARDIOGRAM (TEE);  Surgeon: Gaye Pollack, MD;  Location: [place];  Service: Open Heart Surgery;  Laterality: N/A;     No outpatient medications have been marked as taking for the 12/19/18 encounter (Appointment) with Richardo Priest, MD.     Allergies:   Penicillins   Social History   Tobacco Use  . Smoking status: Never Smoker  . Smokeless tobacco: Never Used  Substance Use Topics  . Alcohol use: Not Currently  . Drug use: Never     Family Hx: The patient's family history is not on file.  ROS:   Please see the history of present illness.     All other systems reviewed and are negative.   Prior CV studies:   The following studies were reviewed today:  Left heart cath: 11/19/18  Coronary Diagrams   Diagnostic  Dominance: Left    Intervention        Echo 11/19/2018: Low normal to mildly reduced ejection fraction 50 to 55% IMPRESSIONS  1. Mild hypokinesis of the left ventricular, mid-apical anteroseptal wall, anterior wall and anterolateral wall.  2. Moderate hypokinesis of the left ventricular, entire apical segment.  3. The left ventricle has low normal systolic function, with an ejection fraction of 50-55%. The cavity size was normal. There is mild concentric left ventricular hypertrophy. Left ventricular diastolic Doppler parameters are consistent  with impaired  relaxation.  4. The right ventricle has normal systolic function. The cavity was normal. There is no increase in right ventricular wall thickness. Right ventricular systolic pressure could not be assessed.  5. Left atrial size was not assessed.  6. Small to moderate pericardial effusion.  7. The pericardial effusion is circumferential.  8. The mitral valve is grossly normal. Mild calcification of the mitral valve leaflet.  9. The aortic valve is tricuspid. Mild thickening of the aortic valve. Moderate calcification of the aortic valve. Aortic valve regurgitation is trivial by color flow Doppler. Preoperative cerebrovascular duplex: Minimal bilateral atherosclerosis and his lower extremity vascular duplexes showed noncompressible vessels.    Summary: Right Carotid: Velocities in the right ICA are consistent with a 1-39% stenosis. Left Carotid: Velocities in the left ICA are consistent with a 1-39% stenosis. Right ABI: Resting right ankle-brachial index indicates noncompressible right lower extremity arteries. Left ABI: Resting left ankle-brachial index indicates noncompressible left lower extremity arteries.  Labs/Other Tests and Data Reviewed:    EKG:    Recent Labs: 11/22/2018: Magnesium 2.6 11/25/2018: BUN 17; Creatinine, Ser 0.91; Hemoglobin 10.7; Platelets 101; Potassium 3.8; Sodium 139   Recent Lipid Panel Lab Results  Component Value Date/Time  CHOL 117 11/20/2018 04:14 AM   TRIG 171 (H) 11/20/2018 04:14 AM   HDL 23 (L) 11/20/2018 04:14 AM   CHOLHDL 5.1 11/20/2018 04:14 AM   LDLCALC 60 11/20/2018 04:14 AM    Wt Readings from Last 3 Encounters:  11/27/18 162 lb 3.2 oz (73.6 kg)     Objective:    Vital Signs:  There were no vitals taken for this visit.  He was alert and oriented thought and cognition were normal his affect was flat mood was normal.  His daughter tells me he has no swelling or wound drainage no respiratory distress or pallor he had no  wheezing during conversation  An ECG dated 11/21/18 was personally reviewed today and demonstrated:  SRTH LAHB deep diffuse T wave inversion -ischemic  ASSESSMENT & PLAN:    1. CAD recent non-ST elevation MI subsequent bypass surgery stable continue medical therapy including dual antiplatelet high intensity statin and reassess in 3 weeks I just do not think he is physically able to participate in cardiac rehabilitation I think that some of his malaise and fatigue his blood pressure beta-blocker effect and I discontinue his thiazide diuretic reduced his beta-blocker and reduced ACE inhibitor will continue to follow blood pressure at home and contact us if greater than 354 systolic.  I also asked him to have lab work done CBC he was anemic in the hospital and a CMP to look for metabolic abnormality from his thiazide diuretic renal function liver function with a statin. 2. Hypertension worsened see above I think much of his symptoms are due to low blood pressure and low heart rate goal systolic less than 1 56-2 40 3. Hyperlipidemia stable continue his high intensity statin lipids are ideal 4. Diabetes stable managed by his PCP  COVID-19 Education: The signs and symptoms of COVID-19 were discussed with the patient and how to seek care for testing (follow up with PCP or arrange E-visit).  The importance of social distancing was discussed today.  Time:   Today, I have spent 26 minutes with the patient with telehealth technology discussing the above problems.     Medication Adjustments/Labs and Tests Ordered: Current medicines are reviewed at length with the patient today.  Concerns regarding medicines are outlined above.   Tests Ordered: No orders of the defined types were placed in this encounter.   Medication Changes: No orders of the defined types were placed in this encounter.   Disposition:  Follow up in 3 week(s)  Signed, Shirlee More, MD  12/19/2018 9:20 AM    [facility]

## 2018-12-20 ENCOUNTER — Telehealth: Payer: Self-pay | Admitting: Cardiology

## 2018-12-20 NOTE — Telephone Encounter (Signed)
Patient's daughter called stating patient had a teleheath visit with [facility] yesterday and forgot to ask Dr Bettina Gavia if he would order a chest xray for the patient in [place] so he does not have to come to [facility] for it. The doctor that did his recent heart heart surgery says he needs one but says [facility] has taken over his care now so he wanted him to order it if possible

## 2018-12-20 NOTE — Telephone Encounter (Signed)
Patient's daughter, Jeannene Patella, per DPR informed that the chest x-ray was ordered by Dr. Cyndia Bent, the cardiothoracic surgeon, and according to his chart this will be completed during upcoming follow up appointment on Tuesday, 12/24/2018, at 2:30. Pam verbalized understanding and is agreeable. She was unaware patient needed to keep the follow up appointment with Dr. Cyndia Bent. Advised her to keep the appointment as scheduled. No further questions.

## 2018-12-24 ENCOUNTER — Other Ambulatory Visit: Payer: Self-pay

## 2018-12-24 ENCOUNTER — Ambulatory Visit
Admission: RE | Admit: 2018-12-24 | Discharge: 2018-12-24 | Disposition: A | Discharge status: Home / Self Care | Payer: PPO | Source: Ambulatory Visit | Attending: Surgery | Admitting: Surgery

## 2018-12-24 ENCOUNTER — Ambulatory Visit (INDEPENDENT_AMBULATORY_CARE_PROVIDER_SITE_OTHER): Payer: Self-pay | Admitting: Physician Assistant

## 2018-12-24 ENCOUNTER — Telehealth: Payer: Self-pay | Admitting: Cardiology

## 2018-12-24 VITALS — BP 126/77 | HR 78 | Temp 97.7°F | Resp 20 | Ht 69.0 in | Wt 159.0 lb

## 2018-12-24 DIAGNOSIS — Z951 Presence of aortocoronary bypass graft: Secondary | ICD-10-CM

## 2018-12-24 DIAGNOSIS — I2510 Atherosclerotic heart disease of native coronary artery without angina pectoris: Secondary | ICD-10-CM | POA: Diagnosis not present

## 2018-12-24 DIAGNOSIS — E119 Type 2 diabetes mellitus without complications: Secondary | ICD-10-CM | POA: Diagnosis not present

## 2018-12-24 DIAGNOSIS — I10 Essential (primary) hypertension: Secondary | ICD-10-CM | POA: Diagnosis not present

## 2018-12-24 MED ORDER — LISINOPRIL 40 MG PO TABS: 20.0000 mg | ORAL_TABLET | Freq: Every day | ORAL | Status: DC

## 2018-12-24 MED ORDER — SIMVASTATIN 20 MG PO TABS: 20.0000 mg | ORAL_TABLET | Freq: Every day | ORAL | 1 refills | Status: DC

## 2018-12-24 MED ORDER — METOPROLOL TARTRATE 25 MG PO TABS: 25.0000 mg | ORAL_TABLET | Freq: Every day | ORAL | 1 refills | Status: DC

## 2018-12-24 NOTE — Patient Instructions (Signed)

## 2018-12-24 NOTE — Telephone Encounter (Signed)
Patient has an appt today at 2:30 with Thoracic surgeon and is concerned his chart is not updated because he had a virtual visit with [facility] 5/21 and he says [facility] changed his meds, took one away and changed how he took two others but his AVS does not reflect the changed.

## 2018-12-24 NOTE — Progress Notes (Signed)
  HPI: Patient returns for routine postoperative follow-up having undergone CABG x 5 on 11/21/2018.  The patient's early postoperative recovery while in the hospital was unremarkable.  Since hospital discharge the patient reports he has not been doing well.  He is accompanied by his daughter who states he continues to have little to no energy.  She states his HR had been low in the 50s and Dr. Bettina Gavia has decreased his BB dose which has improved his HR.  He has been having constant diarrhea which doesn't respond to anti-diarrheals.  He has been unable to eat due to food not being palatable.  He gets winded with ambulation.  He is not ready to start cardiac rehab due to weakness.   Current Outpatient Medications  Medication Sig Dispense Refill  . amLODipine (NORVASC) 10 MG tablet Take 10 mg by mouth daily.    Marland Kitchen aspirin EC 81 MG tablet Take 81 mg by mouth daily.    . clopidogrel (PLAVIX) 75 MG tablet Take 1 tablet (75 mg total) by mouth daily. 30 tablet 11  . ferrous sulfate (FEROSUL) 325 (65 FE) MG tablet Take 325 mg by mouth at bedtime.    Marland Kitchen lisinopril (ZESTRIL) 40 MG tablet Take 0.5 tablets (20 mg total) by mouth daily.    . metFORMIN (GLUCOPHAGE-XR) 750 MG 24 hr tablet Take 750 mg by mouth at bedtime.    . metoprolol tartrate (LOPRESSOR) 25 MG tablet Take 1 tablet (25 mg total) by mouth daily. 60 tablet 1  . omeprazole (PRILOSEC) 20 MG capsule Take 20 mg by mouth every morning.    . traMADol (ULTRAM) 50 MG tablet Take 1 tablet (50 mg total) by mouth every 6 (six) hours as needed for moderate pain. 28 tablet 0  . simvastatin (ZOCOR) 20 MG tablet Take 1 tablet (20 mg total) by mouth daily. 30 tablet 1   No current facility-administered medications for this visit.     Physical Exam:  BP 126/77   Pulse 78   Temp 97.7 F (36.5 C) (Skin)   Resp 20   Ht 5\' 9"  (1.753 m)   Wt 159 lb (72.1 kg)   SpO2 98% Comment: RA  BMI 23.48 kg/m   Gen: no apparent distress Heart: RRR Lungs: CTA  bilaterally Ext: no edema Incisions: well healed  Diagnostic Tests:  CXR: no pleural effusions, or pneumothorax  A/P:  1. S/P CABG x 5- patient is not progressing very well.  I think this is due to poor oral intake and likely mild dehydration with persistent diarrhea.  His HR has improved with reduction of Lopressor dose 2. GI- diarrhea- could be attributed to Lipitor as this is a new medication for the patient, will d/c Lipitor and start Zocor at 20 mg daily to see if diarrhea resolves.... if this doesn't improve, patient may benefit from GI evaluation 3. Diet- patient to continue to take oral intake as he can tolerate, his lost of taste sensation, this should improve with time and hopefully his oral intake will improve, encouraged supplementation with glucerna 4. Activity- increase as tolerated, he is encouraged to increase ambulation as tolerated, should wait until his endurance has increased prior to starting cardiac rehab 5. RTC if symptoms do not improve, follow up with Dr. Bettina Gavia as scheduled  Ellwood Handler, PA-C [facility] [phone]

## 2018-12-24 NOTE — Telephone Encounter (Signed)
Spoke with patient's daughter Maudie Mercury regarding medication changes that were made by Dr Bettina Gavia last week during virtual visit.  Patient's daughter states that "Dr Bettina Gavia stopped one medication and reduced two medications."  I reviewed notes from virtual visit, and the office note does stat that hydrochlorothiazide should be discontinued, lisinopril was reduced to 40mg  1/2 tablet daily, and metoprolol 25 mg was reduced from twice daily to once daily.  Patient's daughter agreed this is how the patient was instructed by Dr Bettina Gavia to take and he had been taking his medication as prescribed.    Medication list updated in chart.

## 2018-12-25 LAB — CBC
Hematocrit: 41.7 % (ref 37.5–51.0)
Hemoglobin: 13.8 g/dL (ref 13.0–17.7)
MCH: 28.8 pg (ref 26.6–33.0)
MCHC: 33.1 g/dL (ref 31.5–35.7)
MCV: 87 fL (ref 79–97)
Platelets: 158 10*3/uL (ref 150–450)
RBC: 4.79 x10E6/uL (ref 4.14–5.80)
RDW: 12.6 % (ref 11.6–15.4)
WBC: 7.1 10*3/uL (ref 3.4–10.8)

## 2018-12-25 LAB — COMPREHENSIVE METABOLIC PANEL
ALT: 37 IU/L (ref 0–44)
AST: 42 IU/L — ABNORMAL HIGH (ref 0–40)
Albumin/Globulin Ratio: 1.7 (ref 1.2–2.2)
Albumin: 3.8 g/dL (ref 3.7–4.7)
Alkaline Phosphatase: 135 IU/L — ABNORMAL HIGH (ref 39–117)
BUN/Creatinine Ratio: 17 (ref 10–24)
BUN: 16 mg/dL (ref 8–27)
Bilirubin Total: 1.2 mg/dL (ref 0.0–1.2)
CO2: 20 mmol/L (ref 20–29)
Calcium: 9 mg/dL (ref 8.6–10.2)
Chloride: 107 mmol/L — ABNORMAL HIGH (ref 96–106)
Creatinine, Ser: 0.96 mg/dL (ref 0.76–1.27)
GFR calc Af Amer: 89 mL/min/{1.73_m2} (ref >59)
GFR calc non Af Amer: 77 mL/min/{1.73_m2} (ref >59)
Globulin, Total: 2.3 g/dL (ref 1.5–4.5)
Glucose: 117 mg/dL — ABNORMAL HIGH (ref 65–99)
Potassium: 4.5 mmol/L (ref 3.5–5.2)
Sodium: 141 mmol/L (ref 134–144)
Total Protein: 6.1 g/dL (ref 6.0–8.5)

## 2019-01-08 ENCOUNTER — Telehealth: Payer: PPO | Admitting: Cardiology

## 2019-01-09 ENCOUNTER — Telehealth: Payer: PPO | Admitting: Cardiology

## 2019-01-09 NOTE — Progress Notes (Signed)
Virtual Visit via Video Note   This visit type was conducted due to national recommendations for restrictions regarding the COVID-19 Pandemic (e.g. social distancing) in an effort to limit this patient's exposure and mitigate transmission in our community.  Due to his co-morbid illnesses, this patient is at least at moderate risk for complications without adequate follow up.  This format is felt to be most appropriate for this patient at this time.  All issues noted in this document were discussed and addressed.  A limited physical exam was performed with this format.  Please refer to the patient's chart for his consent to telehealth for [facility].   Date:  01/09/2019   ID:  Keith Amble., DOB 27-Oct-1945, MRN [id number]  Patient Location: Home Provider Location: Office  PCP:  Patient, No Pcp Per  Cardiologist:  Shirlee More, MD  Electrophysiologist:  None   Evaluation Performed:  Follow-Up Visit  Chief Complaint:  73 year old male with history HTN, HLD, DM2, and CAD s/p recent CABG presents for 3 week follow up after medication adjustment for fatigue and hypotension.   History of Present Illness:    Keith Sollenberger. is a 73 y.o. male with CAD s/p NSTEMI 11/19/18 and CABGx5 11/21/18. He had a history of previous PCI and stent at [facility] several decades ago. He was last seen by me 12/19/18 for hospital follow up with reports of fatigue and hypotension for which beta blocker and ACE were reduced 50% and thiazide diuretic was stopped.   He reports his energy level has been much improved as has his appetite. BP well controlled 120s/60-70 with HR 70s regularly. Endorses some chest wall soreness, but denies chest pain. Denies SOB, DOE, dizziness, hypotension. Has been working in the garden.  2 days ago noted onset of bilateral lower extremity edema. States it is unchanged over the last two days. His daughter assisted me in visual exam via the televisit and they are  non-pitting to 1+ edema on my exam. He does report eating 2 sausage biscuits a few days ago. We discussed salt and how it affects fluid retention. Recommended low salt diet and elevating lower extremities. Discussed amlodipine as potential causative agent - changed to every other day. Will re-introduce HCTZ 12.5 mg on alternating days.   He also reports changes in his urination. Denies dysuria, hematuria, foul odor, flank pain, polyphagia. Reports CBG this morning in the 100s. Reports an overnight output of 28-30 oz and 3-4 trips to the restroom at night. Discussed etiology of BPH versus uncontrolled DM.  The patient does not have symptoms concerning for COVID-19 infection (fever, chills, cough, or new shortness of breath).    Past Medical History:  Diagnosis Date   Diabetes mellitus without complication ([place])    type 2   Hypertension    Past Surgical History:  Procedure Laterality Date   CORONARY ANGIOPLASTY WITH STENT PLACEMENT  1998   CORONARY ARTERY BYPASS GRAFT N/A 11/21/2018   Procedure: CORONARY ARTERY BYPASS GRAFTING (CABG) TIMES FIVE USING LEFT INTERNAL MAMMARY ARTERY AND LEFT SAPHENOUS VEIN HARVESTED ENDOSCOPICALLY;  Surgeon: Gaye Pollack, MD;  Location: [facility];  Service: Open Heart Surgery;  Laterality: N/A;   LEFT HEART CATH AND CORONARY ANGIOGRAPHY N/A 11/19/2018   Procedure: LEFT HEART CATH AND CORONARY ANGIOGRAPHY;  Surgeon: Troy Sine, MD;  Location: [place] [facility];  Service: Cardiovascular;  Laterality: N/A;   TEE WITHOUT CARDIOVERSION N/A 11/21/2018   Procedure: TRANSESOPHAGEAL ECHOCARDIOGRAM (TEE);  Surgeon: Gaye Pollack, MD;  Location: [facility];  Service: Open Heart Surgery;  Laterality: N/A;     No outpatient medications have been marked as taking for the 01/10/19 encounter (Appointment) with Richardo Priest, MD.     Allergies:   Lipitor [atorvastatin calcium] and Penicillins   Social History   Tobacco Use   Smoking status: Never Smoker   Smokeless  tobacco: Former Systems developer    Types: Chew  Substance Use Topics   Alcohol use: Not Currently   Drug use: Never     Family Hx: The patient's family history is not on file.  ROS:   Please see the history of present illness.    Review of Systems  Constitution: Negative for chills, decreased appetite, fever and malaise/fatigue.  Cardiovascular: Positive for leg swelling. Negative for chest pain, dyspnea on exertion, irregular heartbeat, near-syncope and palpitations.  Respiratory: Negative for cough, shortness of breath and wheezing.   Genitourinary: Positive for frequency and nocturia (voiding 3-4x per night). Negative for bladder incontinence, dysuria, hematuria and hesitancy.    All other systems reviewed and are negative.   Prior CV studies:   The following studies were reviewed today:  4/23 CABG Operative Note: Grafts:   1. LIMA to the LAD: 1.75 mm distally. It was sewn end to side using 8-0 prolene continuous suture. 2. SVG to diagonal:  1.6 mm. It was sewn end to side using 7-0 prolene continuous suture. 3. Sequential SVG to OM2:  1.75 mm. It was sewn sequential side to side using 7-0 prolene continuous suture. 4. Sequential SVG to PDA (LCX):  1.75 mm. It was sewn sequential end        to side using 7-0 prolene continuous suture. 5.   SVG to OM1:  1.49mm. It was sewn end to side using 7-0 prolene continuous suture.  422/20 Carotid US: Right Carotid: Velocities in the right ICA are consistent with a 1-39% stenosis. Left Carotid: Velocities in the left ICA are consistent with a 1-39% stenosis.  11/19/18 Echocardiogram:   1. Mild hypokinesis of the left ventricular, mid-apical anteroseptal wall, anterior wall and anterolateral wall.  2. Moderate hypokinesis of the left ventricular, entire apical segment.  3. The left ventricle has low normal systolic function, with an ejection fraction of 50-55%. The cavity size was normal. There is mild concentric left ventricular hypertrophy.  Left ventricular diastolic Doppler parameters are consistent with impaired  relaxation.  4. The right ventricle has normal systolic function. The cavity was normal. There is no increase in right ventricular wall thickness. Right ventricular systolic pressure could not be assessed.  5. Left atrial size was not assessed.  6. Small to moderate pericardial effusion.  7. The pericardial effusion is circumferential.  8. The mitral valve is grossly normal. Mild calcification of the mitral valve leaflet.  9. The aortic valve is tricuspid. Mild thickening of the aortic valve. Moderate calcification of the aortic valve. Aortic valve regurgitation is trivial by color flow Doppler.   11/19/18 Cardiac cath:  Prox RCA to Mid RCA lesion is 100% stenosed.  Ost 1st Mrg to 1st Mrg lesion is 70% stenosed.  1st Mrg lesion is 70% stenosed.  Mid Cx to Dist Cx lesion is 30% stenosed.  Dist LM to Prox LAD lesion is 40% stenosed.  Ost 1st Sept lesion is 90% stenosed.  Prox LAD lesion is 95% stenosed.  Ost 2nd Diag to 2nd Diag lesion is 80% stenosed.  Mid LAD to Dist LAD lesion is 85%  stenosed.  Ost Cx to Prox Cx lesion is 50% stenosed.  The left ventricular systolic function is normal.  LV end diastolic pressure is low.   Severe multivessel CAD with evidence for coronary calcification.     The left main is short and immediately bifurcates into a large LAD and dominant left circumflex vessel.     The LAD has diffuse 40% very proximal stenosis to the first septal perforating artery.  There is 90% stenosis in this large first septal followed by 95% stenosis in the LAD arising at sharp angle after the septal perforating artery.  The diagonal vessel has 80% mid stenosis.  The LAD beyond the diagonal in the mid LAD is 60% stenosed.  There is diffuse 80 to 85% mid distal LAD stenosis extending almost to the apex.   The left circumflex is a dominant vessel that has proximal stenosis of 50% and gives rise to  a large first marginal branch which extends to the apex.  There is a long stent in the proximal portion of this marginal vessel with diffuse 70% in-stent narrowing and additional 70% narrowing beyond the stented segment.  The distal circumflex has 30% narrowing prior to giving rise to the smallPDA/ PLA vessels.   The RCA is a small totally occluded nondominant vessel.   Normal global LV contractility with an EF estimate of approximately 50% with a very small focal mid anterolateral area of hypocontractility and apical hypocontractility.  LVEDP is 5 mmHg.   Labs/Other Tests and Data Reviewed:    EKG:  No ECG reviewed.  Recent Labs: 11/22/2018: Magnesium 2.6 12/24/2018: ALT 37; BUN 16; Creatinine, Ser 0.96; Hemoglobin 13.8; Platelets 158; Potassium 4.5; Sodium 141   Recent Lipid Panel Lab Results  Component Value Date/Time   CHOL 117 11/20/2018 04:14 AM   TRIG 171 (H) 11/20/2018 04:14 AM   HDL 23 (L) 11/20/2018 04:14 AM   CHOLHDL 5.1 11/20/2018 04:14 AM   LDLCALC 60 11/20/2018 04:14 AM    Wt Readings from Last 3 Encounters:  12/24/18 159 lb (72.1 kg)  12/19/18 154 lb 12.8 oz (70.2 kg)  11/27/18 162 lb 3.2 oz (73.6 kg)     Objective:    Vital Signs:  There were no vitals taken for this visit.   VITAL SIGNS:  reviewed GEN:  no acute distress NEURO:  alert and oriented x 3, no obvious focal deficit PSYCH:  normal affect  ASSESSMENT & PLAN:    1. CAD s/p NSTEMI and CABG - Stable. Reports some chest wall soreness post CABG, but denies pain. No SOB, DOE. Much improved energy level after medication changes. GDMT: beta blocker, statin, aspirin. DAPT aspirin and plavix.  2. HTN - Well controlled, BP 120s/60-70 at home. Will adjust Amlodipine to every other day and add HCTZ 12.5mg  on alternating days in the setting of LE edema. 3. HLD - Atorvastatin changed to Simvastatin by TCTS PA secondary to diarrhea. Diarrhea much improved. Recheck lipid panel in 3-6 months.  4. Urinary frequency  - Output 28-30oz overnight and wakes 3-4x per evening to go to the restroom. Reports drinking 2-3 bottles of water/day. Denies polyphagia, difficulty starting stream, dysuria.   Urinalysis, CMP, A1c. 5. Bilateral LE edema - Onset 2 days ago after eating sausage biscuit. Etiology salt intake versus Amlodipine. Echo 10/2018 without heart failure. No SOB/DOE. Recommend low salt diet, elevate lower extremities. Will adjust Amlodipine to every other day and HCTZ on alternating days.  6. DM2 - Managed by PCP. Currently on Metformin  XR. New urinary frequency, will recheck A1c. 11/20/18 A1c 6.2. Continue home glucose monitoring. Consider Jardiance or Wilder Glade if additional agents are needed due to cardioprotective mechanisms.   COVID-19 Education: The signs and symptoms of COVID-19 were discussed with the patient and how to seek care for testing (follow up with PCP or arrange E-visit).  The importance of social distancing was discussed today.  Time:   Today, I have spent 25 minutes with the patient with telehealth technology discussing the above problems.     Medication Adjustments/Labs and Tests Ordered: Current medicines are reviewed at length with the patient today.  Concerns regarding medicines are outlined above.   Tests Ordered: No orders of the defined types were placed in this encounter.   Medication Changes: No orders of the defined types were placed in this encounter.   Disposition:  Follow up in 3 month(s) in office.  Signed, Shirlee More, MD  01/09/2019 8:44 AM    [facility]

## 2019-01-10 ENCOUNTER — Other Ambulatory Visit: Payer: Self-pay

## 2019-01-10 ENCOUNTER — Encounter: Payer: Self-pay | Admitting: Cardiology

## 2019-01-10 ENCOUNTER — Telehealth (INDEPENDENT_AMBULATORY_CARE_PROVIDER_SITE_OTHER): Payer: PPO | Admitting: Cardiology

## 2019-01-10 VITALS — BP 124/69 | HR 55 | Wt 164.0 lb

## 2019-01-10 DIAGNOSIS — R600 Localized edema: Secondary | ICD-10-CM | POA: Insufficient documentation

## 2019-01-10 DIAGNOSIS — E119 Type 2 diabetes mellitus without complications: Secondary | ICD-10-CM

## 2019-01-10 DIAGNOSIS — I10 Essential (primary) hypertension: Secondary | ICD-10-CM

## 2019-01-10 DIAGNOSIS — Z951 Presence of aortocoronary bypass graft: Secondary | ICD-10-CM

## 2019-01-10 DIAGNOSIS — I25118 Atherosclerotic heart disease of native coronary artery with other forms of angina pectoris: Secondary | ICD-10-CM

## 2019-01-10 DIAGNOSIS — E782 Mixed hyperlipidemia: Secondary | ICD-10-CM

## 2019-01-10 DIAGNOSIS — R350 Frequency of micturition: Secondary | ICD-10-CM

## 2019-01-10 HISTORY — DX: Frequency of micturition: R35.0

## 2019-01-10 HISTORY — DX: Localized edema: R60.0

## 2019-01-10 MED ORDER — AMLODIPINE BESYLATE 10 MG PO TABS: 10.0000 mg | ORAL_TABLET | ORAL | 2 refills | Status: DC

## 2019-01-10 MED ORDER — HYDROCHLOROTHIAZIDE 12.5 MG PO CAPS: 12.5000 mg | ORAL_CAPSULE | ORAL | 2 refills | Status: DC

## 2019-01-10 NOTE — Patient Instructions (Addendum)
Medication Instructions:  Your physician has recommended you make the following change in your medication:  START Hydrochlorthyazide 12.5 mg (1 tab) every other day. Alternate with Amlodipine  DECREASE: Amlodipine 10 mg (1 tab) every other day. Alternate with hydrochlorthyazide  If you need a refill on your cardiac medications before your next appointment, please call your pharmacy.   Lab work: Your physician recommends that you return for lab work in: Monday June15,2020 A1C,CMP,UA  If you have labs (blood work) drawn today and your tests are completely normal, you will receive your results only by: Marland Kitchen MyChart Message (if you have MyChart) OR . A paper copy in the mail If you have any lab test that is abnormal or we need to change your treatment, we will call you to review the results.  Testing/Procedures: None  Follow-Up: At [facility], you and your health needs are our priority.  As part of our continuing mission to provide you with exceptional heart care, we have created designated Provider Care Teams.  These Care Teams include your primary Cardiologist (physician) and Advanced Practice Providers (APPs -  Physician Assistants and Nurse Practitioners) who all work together to provide you with the care you need, when you need it. You will need a follow up appointment in 3 months.  Any Other Special Instructions Will Be Listed Below (If Applicable).    Low-Sodium Eating Plan Sodium, which is an element that makes up salt, helps you maintain a healthy balance of fluids in your body. Too much sodium can increase your blood pressure and cause fluid and waste to be held in your body. Your health care provider or dietitian may recommend following this plan if you have high blood pressure (hypertension), kidney disease, liver disease, or heart failure. Eating less sodium can help lower your blood pressure, reduce swelling, and protect your heart, liver, and kidneys. What are tips for  following this plan? General guidelines  Most people on this plan should limit their sodium intake to 1,500-2,000 mg (milligrams) of sodium each day. Reading food labels   The Nutrition Facts label lists the amount of sodium in one serving of the food. If you eat more than one serving, you must multiply the listed amount of sodium by the number of servings.  Choose foods with less than 140 mg of sodium per serving.  Avoid foods with 300 mg of sodium or more per serving. Shopping  Look for lower-sodium products, often labeled as "low-sodium" or "no salt added."  Always check the sodium content even if foods are labeled as "unsalted" or "no salt added".  Buy fresh foods. ? Avoid canned foods and premade or frozen meals. ? Avoid canned, cured, or processed meats  Buy breads that have less than 80 mg of sodium per slice. Cooking  Eat more home-cooked food and less restaurant, buffet, and fast food.  Avoid adding salt when cooking. Use salt-free seasonings or herbs instead of table salt or sea salt. Check with your health care provider or pharmacist before using salt substitutes.  Cook with plant-based oils, such as canola, sunflower, or olive oil. Meal planning  When eating at a restaurant, ask that your food be prepared with less salt or no salt, if possible.  Avoid foods that contain MSG (monosodium glutamate). MSG is sometimes added to [place] food, bouillon, and some canned foods. What foods are recommended? The items listed may not be a complete list. Talk with your dietitian about what dietary choices are best for you. Grains  Low-sodium cereals, including oats, puffed wheat and rice, and shredded wheat. Low-sodium crackers. Unsalted rice. Unsalted pasta. Low-sodium bread. Whole-grain breads and whole-grain pasta. Vegetables Fresh or frozen vegetables. "No salt added" canned vegetables. "No salt added" tomato sauce and paste. Low-sodium or reduced-sodium tomato and vegetable  juice. Fruits Fresh, frozen, or canned fruit. Fruit juice. Meats and other protein foods Fresh or frozen (no salt added) meat, poultry, seafood, and fish. Low-sodium canned tuna and salmon. Unsalted nuts. Dried peas, beans, and lentils without added salt. Unsalted canned beans. Eggs. Unsalted nut butters. Dairy Milk. Soy milk. Cheese that is naturally low in sodium, such as ricotta cheese, fresh mozzarella, or Swiss cheese Low-sodium or reduced-sodium cheese. Cream cheese. Yogurt. Fats and oils Unsalted butter. Unsalted margarine with no trans fat. Vegetable oils such as canola or olive oils. Seasonings and other foods Fresh and dried herbs and spices. Salt-free seasonings. Low-sodium mustard and ketchup. Sodium-free salad dressing. Sodium-free light mayonnaise. Fresh or refrigerated horseradish. Lemon juice. Vinegar. Homemade, reduced-sodium, or low-sodium soups. Unsalted popcorn and pretzels. Low-salt or salt-free chips. What foods are not recommended? The items listed may not be a complete list. Talk with your dietitian about what dietary choices are best for you. Grains Instant hot cereals. Bread stuffing, pancake, and biscuit mixes. Croutons. Seasoned rice or pasta mixes. Noodle soup cups. Boxed or frozen macaroni and cheese. Regular salted crackers. Self-rising flour. Vegetables Sauerkraut, pickled vegetables, and relishes. Olives. [place] fries. Onion rings. Regular canned vegetables (not low-sodium or reduced-sodium). Regular canned tomato sauce and paste (not low-sodium or reduced-sodium). Regular tomato and vegetable juice (not low-sodium or reduced-sodium). Frozen vegetables in sauces. Meats and other protein foods Meat or fish that is salted, canned, smoked, spiced, or pickled. Bacon, ham, sausage, hotdogs, corned beef, chipped beef, packaged lunch meats, salt pork, jerky, pickled herring, anchovies, regular canned tuna, sardines, salted nuts. Dairy Processed cheese and cheese spreads.  Cheese curds. Blue cheese. Feta cheese. String cheese. Regular cottage cheese. Buttermilk. Canned milk. Fats and oils Salted butter. Regular margarine. Ghee. Bacon fat. Seasonings and other foods Onion salt, garlic salt, seasoned salt, table salt, and sea salt. Canned and packaged gravies. Worcestershire sauce. Tartar sauce. Barbecue sauce. Teriyaki sauce. Soy sauce, including reduced-sodium. Steak sauce. Fish sauce. Oyster sauce. Cocktail sauce. Horseradish that you find on the shelf. Regular ketchup and mustard. Meat flavorings and tenderizers. Bouillon cubes. Hot sauce and Tabasco sauce. Premade or packaged marinades. Premade or packaged taco seasonings. Relishes. Regular salad dressings. Salsa. Potato and tortilla chips. Corn chips and puffs. Salted popcorn and pretzels. Canned or dried soups. Pizza. Frozen entrees and pot pies. Summary  Eating less sodium can help lower your blood pressure, reduce swelling, and protect your heart, liver, and kidneys.  Most people on this plan should limit their sodium intake to 1,500-2,000 mg (milligrams) of sodium each day.  Canned, boxed, and frozen foods are high in sodium. Restaurant foods, fast foods, and pizza are also very high in sodium. You also get sodium by adding salt to food.  Try to cook at home, eat more fresh fruits and vegetables, and eat less fast food, canned, processed, or prepared foods. This information is not intended to replace advice given to you by your health care provider. Make sure you discuss any questions you have with your health care provider. Document Released: 01/06/2002 Document Revised: 07/10/2016 Document Reviewed: 07/10/2016 Elsevier Interactive Patient Education  2019 [place].   Hydrochlorothiazide, HCTZ capsules or tablets What is this medicine? HYDROCHLOROTHIAZIDE (hye droe klor  oh THYE a zide) is a diuretic. It increases the amount of urine passed, which causes the body to lose salt and water. This medicine  is used to treat high blood pressure. It is also reduces the swelling and water retention caused by various medical conditions, such as heart, liver, or kidney disease. This medicine may be used for other purposes; ask your health care provider or pharmacist if you have questions. COMMON BRAND NAME(S): Esidrix, Ezide, HydroDIURIL, Microzide, Oretic, Zide What should I tell my health care provider before I take this medicine? They need to know if you have any of these conditions: -diabetes -gout -immune system problems, like lupus -kidney disease or kidney stones -liver disease -pancreatitis -small amount of urine or difficulty passing urine -an unusual or allergic reaction to hydrochlorothiazide, sulfa drugs, other medicines, foods, dyes, or preservatives -pregnant or trying to get pregnant -breast-feeding How should I use this medicine? Take this medicine by mouth with a glass of water. Follow the directions on the prescription label. Take your medicine at regular intervals. Remember that you will need to pass urine frequently after taking this medicine. Do not take your doses at a time of day that will cause you problems. Do not stop taking your medicine unless your doctor tells you to. Talk to your pediatrician regarding the use of this medicine in children. Special care may be needed. Overdosage: If you think you have taken too much of this medicine contact a poison control center or emergency room at once. NOTE: This medicine is only for you. Do not share this medicine with others. What if I miss a dose? If you miss a dose, take it as soon as you can. If it is almost time for your next dose, take only that dose. Do not take double or extra doses. What may interact with this medicine? -cholestyramine -colestipol -digoxin -dofetilide -lithium -medicines for blood pressure -medicines for diabetes -medicines that relax muscles for surgery -other diuretics -steroid medicines like  prednisone or cortisone This list may not describe all possible interactions. Give your health care provider a list of all the medicines, herbs, non-prescription drugs, or dietary supplements you use. Also tell them if you smoke, drink alcohol, or use illegal drugs. Some items may interact with your medicine. What should I watch for while using this medicine? Visit your doctor or health care professional for regular checks on your progress. Check your blood pressure as directed. Ask your doctor or health care professional what your blood pressure should be and when you should contact him or her. You may need to be on a special diet while taking this medicine. Ask your doctor. Check with your doctor or health care professional if you get an attack of severe diarrhea, nausea and vomiting, or if you sweat a lot. The loss of too much body fluid can make it dangerous for you to take this medicine. You may get drowsy or dizzy. Do not drive, use machinery, or do anything that needs mental alertness until you know how this medicine affects you. Do not stand or sit up quickly, especially if you are an older patient. This reduces the risk of dizzy or fainting spells. Alcohol may interfere with the effect of this medicine. Avoid alcoholic drinks. This medicine may affect your blood sugar level. If you have diabetes, check with your doctor or health care professional before changing the dose of your diabetic medicine. This medicine can make you more sensitive to the sun. Keep out of the  sun. If you cannot avoid being in the sun, wear protective clothing and use sunscreen. Do not use sun lamps or tanning beds/booths. What side effects may I notice from receiving this medicine? Side effects that you should report to your doctor or health care professional as soon as possible: -allergic reactions such as skin rash or itching, hives, swelling of the lips, mouth, tongue, or throat -changes in vision -chest pain -eye  pain -fast or irregular heartbeat -feeling faint or lightheaded, falls -gout attack -muscle pain or cramps -pain or difficulty when passing urine -pain, tingling, numbness in the hands or feet -redness, blistering, peeling or loosening of the skin, including inside the mouth -unusually weak or tired Side effects that usually do not require medical attention (report to your doctor or health care professional if they continue or are bothersome): -change in sex drive or performance -dry mouth -headache -stomach upset This list may not describe all possible side effects. Call your doctor for medical advice about side effects. You may report side effects to FDA at 1-800-FDA-1088. Where should I keep my medicine? Keep out of the reach of children. Store at room temperature between 15 and 30 degrees C (59 and 86 degrees F). Do not freeze. Protect from light and moisture. Keep container closed tightly. Throw away any unused medicine after the expiration date. NOTE: This sheet is a summary. It may not cover all possible information. If you have questions about this medicine, talk to your doctor, pharmacist, or health care provider.  2019 Elsevier/Gold Standard (2010-03-11 12:57:37)

## 2019-01-13 DIAGNOSIS — I10 Essential (primary) hypertension: Secondary | ICD-10-CM | POA: Diagnosis not present

## 2019-01-13 DIAGNOSIS — R350 Frequency of micturition: Secondary | ICD-10-CM | POA: Diagnosis not present

## 2019-01-14 LAB — URINALYSIS
Bilirubin, UA: NEGATIVE
Glucose, UA: NEGATIVE
Leukocytes,UA: NEGATIVE
Nitrite, UA: NEGATIVE
RBC, UA: NEGATIVE
Specific Gravity, UA: 1.021 (ref 1.005–1.030)
Urobilinogen, Ur: 0.2 mg/dL (ref 0.2–1.0)
pH, UA: 5 (ref 5.0–7.5)

## 2019-01-14 LAB — COMPREHENSIVE METABOLIC PANEL
ALT: 23 IU/L (ref 0–44)
AST: 34 IU/L (ref 0–40)
Albumin/Globulin Ratio: 2 (ref 1.2–2.2)
Albumin: 3.9 g/dL (ref 3.7–4.7)
Alkaline Phosphatase: 100 IU/L (ref 39–117)
BUN/Creatinine Ratio: 16 (ref 10–24)
BUN: 14 mg/dL (ref 8–27)
Bilirubin Total: 0.8 mg/dL (ref 0.0–1.2)
CO2: 23 mmol/L (ref 20–29)
Calcium: 9.3 mg/dL (ref 8.6–10.2)
Chloride: 106 mmol/L (ref 96–106)
Creatinine, Ser: 0.85 mg/dL (ref 0.76–1.27)
GFR calc Af Amer: 99 mL/min/{1.73_m2} (ref >59)
GFR calc non Af Amer: 85 mL/min/{1.73_m2} (ref >59)
Globulin, Total: 2 g/dL (ref 1.5–4.5)
Glucose: 118 mg/dL — ABNORMAL HIGH (ref 65–99)
Potassium: 4.8 mmol/L (ref 3.5–5.2)
Sodium: 143 mmol/L (ref 134–144)
Total Protein: 5.9 g/dL — ABNORMAL LOW (ref 6.0–8.5)

## 2019-01-14 LAB — HEMOGLOBIN A1C
Est. average glucose Bld gHb Est-mCnc: 126 mg/dL
Hgb A1c MFr Bld: 6 % — ABNORMAL HIGH (ref 4.8–5.6)

## 2019-01-15 ENCOUNTER — Ambulatory Visit: Payer: PPO | Admitting: Surgery

## 2019-01-29 ENCOUNTER — Telehealth: Payer: Self-pay

## 2019-01-29 NOTE — Telephone Encounter (Signed)
Keith Lucero notified that Dr Bettina Gavia would like patient to have an appointment in the office to be seen.  Patient is scheduled to see Dr Bettina Gavia on 01-30-2019 at 1:55pm.  Keith Lucero advised to take patient to ED if symptoms return or worsen throughout the night tonight.   Patient's daughter verbalized understanding and agreed to plan.

## 2019-01-29 NOTE — Telephone Encounter (Signed)
He needs an office visit.  

## 2019-01-29 NOTE — Progress Notes (Signed)
Cardiology Office Note:    Date:  01/30/2019   ID:  Keith Amble., DOB March 12, 1946, MRN [id number]  PCP:  Patient, No Pcp Per  Cardiologist:  Keith More, MD    Referring MD: No ref. provider found    ASSESSMENT:    1. Coronary artery disease of native artery of native heart with stable angina pectoris (HCC)   2. Chest pain, unspecified type   3. S/P CABG x 5   4. Mixed hyperlipidemia   5. Essential hypertension    PLAN:    In order of problems listed above:  1. CAD - Anginal symptoms Tuesday - woke up at 0200 in a sweat with chest pain radiating down L arm relieved by 2 nitroglycerin. Concern for graft occlusion or new ischemia. EKG today sinus bradycardia with ST&T wave changes showing less ischemia than previous EKG. GDMT aspirin, beta blocker, statin. DAPT aspirin and plavix without bleeding complication.   Refill nitroglycerin  New Rx Clonidine as needed for systolic blood pressure >622  Education to present to emergency department if chest pain recurs and is unrelieved by nitroglycerin  Lexiscan Myoview to rule out graft occlusion or new ischemia 2. S/p CABG x5 - Surgical sites healing appropriately. Plan as above.  3. HLD - Atorvastatin previously changed to Pravastatin for tolerance. Denies medication SE.   CMP and lipid today 4. HTN - Reports home BP are labile, checks 3-4 times throughout the day. Antihypertensives were previously decreased for hypotension. BP elevated today on exam.   New prescription for as needed Clonidine.  Consider up titration of current antihypertensives pending results of ischemic evaluation.   Next appointment: 2 weeks after Lexiscan   Medication Adjustments/Labs and Tests Ordered: Current medicines are reviewed at length with the patient today.  Concerns regarding medicines are outlined above.  Orders Placed This Encounter  Procedures  . MYOCARDIAL PERFUSION IMAGING  . EKG 12-Lead   Meds ordered this encounter   Medications  . cloNIDine (CATAPRES) 0.1 MG tablet    Sig: Take 1 tablet (0.1 mg total) by mouth daily as needed. If sytolic BP (top number) greater than 180    Dispense:  30 tablet    Refill:  1  . nitroGLYCERIN (NITROSTAT) 0.4 MG SL tablet    Sig: Place 1 tablet (0.4 mg total) under the tongue every 5 (five) minutes as needed for chest pain.    Dispense:  30 tablet    Refill:  2    No chief complaint on file.   History of Present Illness:    Keith Lucero. is a 73 y.o. male with a hx of CAD s/p NSTEMI 11/19/18 and CABGx5 11/21/18. He had a history of previous PCI and stent at [facility] several decades ago. After his CABG beta blocker, ACE, thiazide diuretic, and Amlodipine were reduced for complaints of fatigue and hypotension.  He was  last seen 01/10/2019.  He presents today as an urgent work in after telephone encounter with chief complaint of chest pain "episodes" and labile BP. Reports episode Tuesday at 0200 that he awoke with chest pain down his L arm, and sweating. Checked his BP which was 204/115. He swallowed one nitroglycerin without relief. Placed another under his tongue with relief.   Reports home BP are labile. Checks several times per day. Numbers reported range with systolic BP 297-989 and diastolic from 21-194.   He reports fatigue and dyspnea with exertion. Tells me he just does not feel well. Denies  edema and shortness of breath at rest. Tells me his LLE will get "sore" below the vein grafting site and is relieved by [facility] and rubbing it.   Compliance with diet, lifestyle and medications: Yes Past Medical History:  Diagnosis Date  . Diabetes mellitus without complication ([place])    type 2  . Hypertension     Past Surgical History:  Procedure Laterality Date  . APPENDECTOMY    . BACK SURGERY    . CHOLECYSTECTOMY    . CORONARY ANGIOPLASTY WITH STENT PLACEMENT  1998  . CORONARY ARTERY BYPASS GRAFT N/A 11/21/2018   Procedure: CORONARY ARTERY  BYPASS GRAFTING (CABG) TIMES FIVE USING LEFT INTERNAL MAMMARY ARTERY AND LEFT SAPHENOUS VEIN HARVESTED ENDOSCOPICALLY;  Surgeon: Keith Pollack, MD;  Location: [place];  Service: Open Heart Surgery;  Laterality: N/A;  . LEFT HEART CATH AND CORONARY ANGIOGRAPHY N/A 11/19/2018   Procedure: LEFT HEART CATH AND CORONARY ANGIOGRAPHY;  Surgeon: Keith Sine, MD;  Location: [place] [facility];  Service: Cardiovascular;  Laterality: N/A;  . TEE WITHOUT CARDIOVERSION N/A 11/21/2018   Procedure: TRANSESOPHAGEAL ECHOCARDIOGRAM (TEE);  Surgeon: Keith Pollack, MD;  Location: [place];  Service: Open Heart Surgery;  Laterality: N/A;    Current Medications: Current Meds  Medication Sig  . amLODipine (NORVASC) 10 MG tablet Take 1 tablet (10 mg total) by mouth every other day. Alternate with HZTZ  . aspirin EC 81 MG tablet Take 81 mg by mouth daily.  . clopidogrel (PLAVIX) 75 MG tablet Take 1 tablet (75 mg total) by mouth daily.  . ferrous sulfate (FEROSUL) 325 (65 FE) MG tablet Take 325 mg by mouth at bedtime.  . hydrochlorothiazide (MICROZIDE) 12.5 MG capsule Take 1 capsule (12.5 mg total) by mouth every other day. Alternate with amlodipine  . lisinopril (ZESTRIL) 40 MG tablet Take 0.5 tablets (20 mg total) by mouth daily.  . metFORMIN (GLUCOPHAGE-XR) 750 MG 24 hr tablet Take 750 mg by mouth at bedtime.  . metoprolol tartrate (LOPRESSOR) 25 MG tablet Take 1 tablet (25 mg total) by mouth daily.  Marland Kitchen omeprazole (PRILOSEC) 20 MG capsule Take 20 mg by mouth every morning.  . simvastatin (ZOCOR) 20 MG tablet Take 1 tablet (20 mg total) by mouth daily.  . traMADol (ULTRAM) 50 MG tablet Take 1 tablet (50 mg total) by mouth every 6 (six) hours as needed for moderate pain.     Allergies:   Lipitor [atorvastatin calcium] and Penicillins   Social History   Socioeconomic History  . Marital status: Married    Spouse name: Not on file  . Number of children: Not on file  . Years of education: Not on file  . Highest  education level: Not on file  Occupational History  . Not on file  Social Needs  . Financial resource strain: Not on file  . Food insecurity    Worry: Not on file    Inability: Not on file  . Transportation needs    Medical: Not on file    Non-medical: Not on file  Tobacco Use  . Smoking status: Never Smoker  . Smokeless tobacco: Former Systems developer    Types: Chew  Substance and Sexual Activity  . Alcohol use: Not Currently  . Drug use: Never  . Sexual activity: Not on file  Lifestyle  . Physical activity    Days per week: Not on file    Minutes per session: Not on file  . Stress: Not on file  Relationships  .  Social Herbalist on phone: Not on file    Gets together: Not on file    Attends religious service: Not on file    Active member of club or organization: Not on file    Attends meetings of clubs or organizations: Not on file    Relationship status: Not on file  Other Topics Concern  . Not on file  Social History Narrative  . Not on file     Family History: The patient's family history includes Cancer in his sister; Diabetes in his sister; Heart attack in his mother and sister; Stroke in his mother and sister. ROS:   Please see the history of present illness.    Review of Systems  Constitution: Positive for malaise/fatigue. Negative for chills and fever.  Cardiovascular: Positive for chest pain (episode a few nights ago) and dyspnea on exertion. Negative for irregular heartbeat and leg swelling.  Respiratory: Negative for cough, shortness of breath and wheezing.   Gastrointestinal: Negative for nausea and vomiting.  Neurological: Negative for light-headedness and weakness.    All other systems reviewed and are negative.  EKGs/Labs/Other Studies Reviewed:    The following studies were reviewed today:    4/23 CABG Operative Note: Grafts:   1. LIMA to the LAD: 1.75 mm distally. It was sewn end to side using 8-0 prolene continuous suture. 2. SVG to  diagonal:  1.6 mm. It was sewn end to side using 7-0 prolene continuous suture. 3. Sequential SVG to OM2:  1.75 mm. It was sewn sequential side to side using 7-0 prolene continuous suture. 4. Sequential SVG to PDA (LCX):  1.75 mm. It was sewn sequential end        to side using 7-0 prolene continuous suture. 5.   SVG to OM1:  1.33mm. It was sewn end to side using 7-0 prolene continuous suture.   422/20 Carotid US: Right Carotid: Velocities in the right ICA are consistent with a 1-39% stenosis. Left Carotid: Velocities in the left ICA are consistent with a 1-39% stenosis.   11/19/18 Echocardiogram:   1. Mild hypokinesis of the left ventricular, mid-apical anteroseptal wall, anterior wall and anterolateral wall.  2. Moderate hypokinesis of the left ventricular, entire apical segment.  3. The left ventricle has low normal systolic function, with an ejection fraction of 50-55%. The cavity size was normal. There is mild concentric left ventricular hypertrophy. Left ventricular diastolic Doppler parameters are consistent with impaired  relaxation.  4. The right ventricle has normal systolic function. The cavity was normal. There is no increase in right ventricular wall thickness. Right ventricular systolic pressure could not be assessed.  5. Left atrial size was not assessed.  6. Small to moderate pericardial effusion.  7. The pericardial effusion is circumferential.  8. The mitral valve is grossly normal. Mild calcification of the mitral valve leaflet.  9. The aortic valve is tricuspid. Mild thickening of the aortic valve. Moderate calcification of the aortic valve. Aortic valve regurgitation is trivial by color flow Doppler.     11/19/18 Cardiac cath:  Prox RCA to Mid RCA lesion is 100% stenosed.  Ost 1st Mrg to 1st Mrg lesion is 70% stenosed.  1st Mrg lesion is 70% stenosed.  Mid Cx to Dist Cx lesion is 30% stenosed.  Dist LM to Prox LAD lesion is 40% stenosed.  Ost 1st Sept lesion is  90% stenosed.  Prox LAD lesion is 95% stenosed.  Ost 2nd Diag to 2nd Diag lesion is 80% stenosed.  Mid LAD to Dist LAD lesion is 85% stenosed.  Ost Cx to Prox Cx lesion is 50% stenosed.  The left ventricular systolic function is normal.  LV end diastolic pressure is low.   Severe multivessel CAD with evidence for coronary calcification.     The left main is short and immediately bifurcates into a large LAD and dominant left circumflex vessel.     The LAD has diffuse 40% very proximal stenosis to the first septal perforating artery.  There is 90% stenosis in this large first septal followed by 95% stenosis in the LAD arising at sharp angle after the septal perforating artery.  The diagonal vessel has 80% mid stenosis.  The LAD beyond the diagonal in the mid LAD is 60% stenosed.  There is diffuse 80 to 85% mid distal LAD stenosis extending almost to the apex.   The left circumflex is a dominant vessel that has proximal stenosis of 50% and gives rise to a large first marginal branch which extends to the apex.  There is a long stent in the proximal portion of this marginal vessel with diffuse 70% in-stent narrowing and additional 70% narrowing beyond the stented segment.  The distal circumflex has 30% narrowing prior to giving rise to the smallPDA/ PLA vessels.   The RCA is a small totally occluded nondominant vessel.   Normal global LV contractility with an EF estimate of approximately 50% with a very small focal mid anterolateral area of hypocontractility and apical hypocontractility.  LVEDP is 5 mmHg.  EKG:  EKG ordered today and personally reviewed.  The ekg ordered today demonstrates sinus bradycardia rate 57 with LAFB and ST & T waves showing less ischemia than previous.   Recent Labs: 11/22/2018: Magnesium 2.6 12/24/2018: Hemoglobin 13.8; Platelets 158 01/13/2019: ALT 23; BUN 14; Creatinine, Ser 0.85; Potassium 4.8; Sodium 143  Recent Lipid Panel    Component Value Date/Time   CHOL  117 11/20/2018 0414   TRIG 171 (H) 11/20/2018 0414   HDL 23 (L) 11/20/2018 0414   CHOLHDL 5.1 11/20/2018 0414   VLDL 34 11/20/2018 0414   LDLCALC 60 11/20/2018 0414    Physical Exam:    VS:  BP (!) 156/78 (BP Location: Right Arm, Patient Position: Sitting, Cuff Size: Normal)   Pulse (!) 57   Temp 97.9 F (36.6 C)   Ht 5\' 9"  (1.753 m)   Wt 162 lb 1.9 oz (73.5 kg)   SpO2 99%   BMI 23.94 kg/m     Wt Readings from Last 3 Encounters:  01/30/19 162 lb 1.9 oz (73.5 kg)  01/10/19 164 lb (74.4 kg)  12/24/18 159 lb (72.1 kg)     GEN:  Well nourished, thin, well developed in no acute distress HEENT: Normal NECK: No JVD; No carotid bruits LYMPHATICS: No lymphadenopathy CARDIAC: RRR, no murmurs, rubs, gallops RESPIRATORY:  Clear to auscultation without rales, wheezing or rhonchi  ABDOMEN: Soft, non-tender, non-distended MUSCULOSKELETAL:  No edema; No deformity  SKIN: Warm and dry. Midsternal incision clean, dry, intact and healing appropriately. 3 scars from chest tubes while hospitalized below midsternal incision healing appropriately without exudate. NEUROLOGIC:  Alert and oriented x 3 PSYCHIATRIC:  Normal affect    Signed, Keith More, MD  01/30/2019 2:33 PM    [place]

## 2019-01-29 NOTE — Telephone Encounter (Signed)
Patient's daughter Mickel Baas called stating that patient had an episode last night while lying in the bed.  He was lying flat when he started having sweats, arm numbness, and chest pain. His blood pressure was "sky high-running over 200."  Patient took 2 NTG.  Patient's daughter does not report any nausea or fever. Patient used ice pack on his chest to help reduce pain.  Patient's daughter states he called her home from work last week with similar symptoms, but last nights episode last longer and symptoms were much stronger.  Please advise.

## 2019-01-30 ENCOUNTER — Emergency Department (HOSPITAL_COMMUNITY): Payer: PPO

## 2019-01-30 ENCOUNTER — Inpatient Hospital Stay (HOSPITAL_COMMUNITY)
Admission: EM | Admit: 2019-01-30 | Discharge: 2019-02-04 | DRG: 281 | Disposition: A | Discharge status: Home / Self Care | Payer: PPO | Attending: Cardiovascular Disease | Admitting: Cardiovascular Disease

## 2019-01-30 ENCOUNTER — Other Ambulatory Visit: Payer: Self-pay

## 2019-01-30 ENCOUNTER — Encounter (HOSPITAL_COMMUNITY): Payer: Self-pay | Admitting: *Deleted

## 2019-01-30 ENCOUNTER — Ambulatory Visit (INDEPENDENT_AMBULATORY_CARE_PROVIDER_SITE_OTHER): Payer: PPO | Admitting: Cardiology

## 2019-01-30 ENCOUNTER — Encounter: Payer: Self-pay | Admitting: Cardiology

## 2019-01-30 ENCOUNTER — Encounter: Payer: Self-pay | Admitting: *Deleted

## 2019-01-30 VITALS — BP 156/78 | HR 57 | Temp 97.9°F | Ht 69.0 in | Wt 162.1 lb

## 2019-01-30 DIAGNOSIS — R079 Chest pain, unspecified: Secondary | ICD-10-CM | POA: Diagnosis not present

## 2019-01-30 DIAGNOSIS — Z79899 Other long term (current) drug therapy: Secondary | ICD-10-CM | POA: Diagnosis not present

## 2019-01-30 DIAGNOSIS — I214 Non-ST elevation (NSTEMI) myocardial infarction: Secondary | ICD-10-CM | POA: Diagnosis present

## 2019-01-30 DIAGNOSIS — Z7984 Long term (current) use of oral hypoglycemic drugs: Secondary | ICD-10-CM | POA: Diagnosis not present

## 2019-01-30 DIAGNOSIS — I25810 Atherosclerosis of coronary artery bypass graft(s) without angina pectoris: Secondary | ICD-10-CM | POA: Diagnosis not present

## 2019-01-30 DIAGNOSIS — I495 Sick sinus syndrome: Secondary | ICD-10-CM | POA: Diagnosis present

## 2019-01-30 DIAGNOSIS — Z8249 Family history of ischemic heart disease and other diseases of the circulatory system: Secondary | ICD-10-CM | POA: Diagnosis not present

## 2019-01-30 DIAGNOSIS — I313 Pericardial effusion (noninflammatory): Secondary | ICD-10-CM | POA: Diagnosis present

## 2019-01-30 DIAGNOSIS — Z955 Presence of coronary angioplasty implant and graft: Secondary | ICD-10-CM | POA: Diagnosis not present

## 2019-01-30 DIAGNOSIS — I10 Essential (primary) hypertension: Secondary | ICD-10-CM

## 2019-01-30 DIAGNOSIS — I2510 Atherosclerotic heart disease of native coronary artery without angina pectoris: Secondary | ICD-10-CM | POA: Diagnosis present

## 2019-01-30 DIAGNOSIS — E782 Mixed hyperlipidemia: Secondary | ICD-10-CM

## 2019-01-30 DIAGNOSIS — E119 Type 2 diabetes mellitus without complications: Secondary | ICD-10-CM | POA: Diagnosis present

## 2019-01-30 DIAGNOSIS — Z951 Presence of aortocoronary bypass graft: Secondary | ICD-10-CM

## 2019-01-30 DIAGNOSIS — Z823 Family history of stroke: Secondary | ICD-10-CM

## 2019-01-30 DIAGNOSIS — Z7982 Long term (current) use of aspirin: Secondary | ICD-10-CM | POA: Diagnosis not present

## 2019-01-30 DIAGNOSIS — I25118 Atherosclerotic heart disease of native coronary artery with other forms of angina pectoris: Secondary | ICD-10-CM | POA: Diagnosis not present

## 2019-01-30 DIAGNOSIS — I252 Old myocardial infarction: Secondary | ICD-10-CM | POA: Diagnosis not present

## 2019-01-30 DIAGNOSIS — Z20828 Contact with and (suspected) exposure to other viral communicable diseases: Secondary | ICD-10-CM | POA: Diagnosis not present

## 2019-01-30 DIAGNOSIS — Z1159 Encounter for screening for other viral diseases: Secondary | ICD-10-CM

## 2019-01-30 DIAGNOSIS — E785 Hyperlipidemia, unspecified: Secondary | ICD-10-CM

## 2019-01-30 DIAGNOSIS — Z7902 Long term (current) use of antithrombotics/antiplatelets: Secondary | ICD-10-CM | POA: Diagnosis not present

## 2019-01-30 DIAGNOSIS — R0602 Shortness of breath: Secondary | ICD-10-CM | POA: Diagnosis not present

## 2019-01-30 DIAGNOSIS — R197 Diarrhea, unspecified: Secondary | ICD-10-CM | POA: Diagnosis present

## 2019-01-30 HISTORY — DX: Atherosclerotic heart disease of native coronary artery without angina pectoris: I25.10

## 2019-01-30 LAB — CBC
HCT: 41.2 % (ref 39.0–52.0)
Hemoglobin: 13.6 g/dL (ref 13.0–17.0)
MCH: 29.1 pg (ref 26.0–34.0)
MCHC: 33 g/dL (ref 30.0–36.0)
MCV: 88.2 fL (ref 80.0–100.0)
Platelets: 129 10*3/uL — ABNORMAL LOW (ref 150–400)
RBC: 4.67 MIL/uL (ref 4.22–5.81)
RDW: 12.6 % (ref 11.5–15.5)
WBC: 6.6 10*3/uL (ref 4.0–10.5)
nRBC: 0 % (ref 0.0–0.2)

## 2019-01-30 MED ORDER — NITROGLYCERIN 0.4 MG SL SUBL: 0.4000 mg | SUBLINGUAL_TABLET | SUBLINGUAL | 2 refills | Status: DC | PRN

## 2019-01-30 MED ORDER — SODIUM CHLORIDE 0.9% FLUSH: 3.0000 mL | Freq: Once | INTRAVENOUS | Status: DC

## 2019-01-30 MED ORDER — CLONIDINE HCL 0.1 MG PO TABS: 0.1000 mg | ORAL_TABLET | Freq: Every day | ORAL | 1 refills | Status: DC | PRN

## 2019-01-30 NOTE — ED Notes (Signed)
RN received a call from Dr. Bettina Gavia, he was sending pt to hospital.  Pt had an abnormal EKG earlier today and a trop was .36.  Provider stated he thought pt was having a graft occlusion.

## 2019-01-30 NOTE — Patient Instructions (Addendum)
Medication Instructions:  Your physician has recommended you make the following change in your medication:   START: Clonidine 0.1mg : TAke 1 tab daily as needed for sytolic (top number) is greater than 180  Nitroglycerin 0.4 mg sublingual (under your tongue) as needed for chest pain. If experiencing chest pain, stop what you are doing and sit down. Take 1 nitroglycerin and wait 5 minutes. If chest pain continues, take another nitroglycerin and wait 5 minutes. If chest pain does not subside, take 1 more nitroglycerin and dial 911. You make take a total of 3 nitroglycerin in a 15 minute time frame.    If you need a refill on your cardiac medications before your next appointment, please call your pharmacy.   Lab work: Your physician recommends that you return for lab work in: TODAY CMP,LIPID,Troponin  If you have labs (blood work) drawn today and your tests are completely normal, you will receive your results only by: Marland Kitchen MyChart Message (if you have MyChart) OR . A paper copy in the mail If you have any lab test that is abnormal or we need to change your treatment, we will call you to review the results.  Testing/Procedures: Your physician has requested that you have a lexiscan myoview. For further information please visit [email or web address]. Please follow instruction sheet, as given.   Follow-Up: At [facility], you and your health needs are our priority.  As part of our continuing mission to provide you with exceptional heart care, we have created designated Provider Care Teams.  These Care Teams include your primary Cardiologist (physician) and Advanced Practice Providers (APPs -  Physician Assistants and Nurse Practitioners) who all work together to provide you with the care you need, when you need it. You will need a follow up appointment in 2 weeks after lexiscan.    Any Other Special Instructions Will Be Listed Below (If Applicable).   Cardiac Nuclear Scan A cardiac nuclear  scan is a test that is done to check the flow of blood to your heart. It is done when you are resting and when you are exercising. The test looks for problems such as:  Not enough blood reaching a portion of the heart.  The heart muscle not working as it should. You may need this test if:  You have heart disease.  You have had lab results that are not normal.  You have had heart surgery or a balloon procedure to open up blocked arteries (angioplasty).  You have chest pain.  You have shortness of breath. In this test, a special dye (tracer) is put into your bloodstream. The tracer will travel to your heart. A camera will then take pictures of your heart to see how the tracer moves through your heart. This test is usually done at a hospital and takes 2-4 hours. Tell a doctor about:  Any allergies you have.  All medicines you are taking, including vitamins, herbs, eye drops, creams, and over-the-counter medicines.  Any problems you or family members have had with anesthetic medicines.  Any blood disorders you have.  Any surgeries you have had.  Any medical conditions you have.  Whether you are pregnant or may be pregnant. What are the risks? Generally, this is a safe test. However, problems may occur, such as:  Serious chest pain and heart attack. This is only a risk if the stress portion of the test is done.  Rapid heartbeat.  A feeling of warmth in your chest. This feeling usually does not last long.  Allergic reaction to the tracer. What happens before the test?  Ask your doctor about changing or stopping your normal medicines. This is important.  Follow instructions from your doctor about what you cannot eat or drink.  Remove your jewelry on the day of the test. What happens during the test?  An IV tube will be inserted into one of your veins.  Your doctor will give you a small amount of tracer through the IV tube.  You will wait for 20-40 minutes while the  tracer moves through your bloodstream.  Your heart will be monitored with an electrocardiogram (ECG).  You will lie down on an exam table.  Pictures of your heart will be taken for about 15-20 minutes.  You may also have a stress test. For this test, one of these things may be done: ? You will be asked to exercise on a treadmill or a stationary bike. ? You will be given medicines that will make your heart work harder. This is done if you are unable to exercise.  When blood flow to your heart has peaked, a tracer will again be given through the IV tube.  After 20-40 minutes, you will get back on the exam table. More pictures will be taken of your heart.  Depending on the tracer that is used, more pictures may need to be taken 3-4 hours later.  Your IV tube will be removed when the test is over. The test may vary among doctors and hospitals. What happens after the test?  Ask your doctor: ? Whether you can return to your normal schedule, including diet, activities, and medicines. ? Whether you should drink more fluids. This will help to remove the tracer from your body. Drink enough fluid to keep your pee (urine) pale yellow.  Ask your doctor, or the department that is doing the test: ? When will my results be ready? ? How will I get my results? Summary  A cardiac nuclear scan is a test that is done to check the flow of blood to your heart.  Tell your doctor whether you are pregnant or may be pregnant.  Before the test, ask your doctor about changing or stopping your normal medicines. This is important.  Ask your doctor whether you can return to your normal activities. You may be asked to drink more fluids. This information is not intended to replace advice given to you by your health care provider. Make sure you discuss any questions you have with your health care provider. Document Released: 12/31/2017 Document Revised: 11/06/2018 Document Reviewed: 12/31/2017 Elsevier Patient  Education  2020 [employer].

## 2019-01-30 NOTE — ED Triage Notes (Signed)
Pt sent here by provider for elevated troponin level (dr. Bettina Gavia). Pt says he has had tightness in his chest and shortness of breath on exertion, pt daughter laura is the primary contact. Info in chart

## 2019-01-31 ENCOUNTER — Inpatient Hospital Stay (HOSPITAL_COMMUNITY): Payer: PPO

## 2019-01-31 ENCOUNTER — Encounter (HOSPITAL_COMMUNITY): Payer: Self-pay

## 2019-01-31 ENCOUNTER — Other Ambulatory Visit: Payer: Self-pay | Admitting: Cardiovascular Disease

## 2019-01-31 DIAGNOSIS — I214 Non-ST elevation (NSTEMI) myocardial infarction: Secondary | ICD-10-CM

## 2019-01-31 DIAGNOSIS — E785 Hyperlipidemia, unspecified: Secondary | ICD-10-CM | POA: Diagnosis present

## 2019-01-31 DIAGNOSIS — Z823 Family history of stroke: Secondary | ICD-10-CM | POA: Diagnosis not present

## 2019-01-31 DIAGNOSIS — E119 Type 2 diabetes mellitus without complications: Secondary | ICD-10-CM | POA: Diagnosis present

## 2019-01-31 DIAGNOSIS — Z79899 Other long term (current) drug therapy: Secondary | ICD-10-CM | POA: Diagnosis not present

## 2019-01-31 DIAGNOSIS — R079 Chest pain, unspecified: Secondary | ICD-10-CM

## 2019-01-31 DIAGNOSIS — I313 Pericardial effusion (noninflammatory): Secondary | ICD-10-CM | POA: Diagnosis present

## 2019-01-31 DIAGNOSIS — Z7982 Long term (current) use of aspirin: Secondary | ICD-10-CM | POA: Diagnosis not present

## 2019-01-31 DIAGNOSIS — Z955 Presence of coronary angioplasty implant and graft: Secondary | ICD-10-CM | POA: Diagnosis not present

## 2019-01-31 DIAGNOSIS — I495 Sick sinus syndrome: Secondary | ICD-10-CM | POA: Diagnosis present

## 2019-01-31 DIAGNOSIS — I10 Essential (primary) hypertension: Secondary | ICD-10-CM | POA: Diagnosis present

## 2019-01-31 DIAGNOSIS — I252 Old myocardial infarction: Secondary | ICD-10-CM | POA: Diagnosis not present

## 2019-01-31 DIAGNOSIS — I2510 Atherosclerotic heart disease of native coronary artery without angina pectoris: Secondary | ICD-10-CM | POA: Diagnosis present

## 2019-01-31 DIAGNOSIS — Z951 Presence of aortocoronary bypass graft: Secondary | ICD-10-CM | POA: Diagnosis not present

## 2019-01-31 DIAGNOSIS — Z1159 Encounter for screening for other viral diseases: Secondary | ICD-10-CM | POA: Diagnosis not present

## 2019-01-31 DIAGNOSIS — Z7984 Long term (current) use of oral hypoglycemic drugs: Secondary | ICD-10-CM | POA: Diagnosis not present

## 2019-01-31 DIAGNOSIS — R197 Diarrhea, unspecified: Secondary | ICD-10-CM | POA: Diagnosis present

## 2019-01-31 DIAGNOSIS — Z7902 Long term (current) use of antithrombotics/antiplatelets: Secondary | ICD-10-CM | POA: Diagnosis not present

## 2019-01-31 DIAGNOSIS — I25810 Atherosclerosis of coronary artery bypass graft(s) without angina pectoris: Secondary | ICD-10-CM | POA: Diagnosis not present

## 2019-01-31 DIAGNOSIS — Z8249 Family history of ischemic heart disease and other diseases of the circulatory system: Secondary | ICD-10-CM | POA: Diagnosis not present

## 2019-01-31 LAB — ECHOCARDIOGRAM COMPLETE
Height: 69 in
Weight: 2566.4 oz

## 2019-01-31 LAB — BASIC METABOLIC PANEL
Anion gap: 10 (ref 5–15)
BUN: 21 mg/dL (ref 8–23)
CO2: 22 mmol/L (ref 22–32)
Calcium: 9.1 mg/dL (ref 8.9–10.3)
Chloride: 106 mmol/L (ref 98–111)
Creatinine, Ser: 0.99 mg/dL (ref 0.61–1.24)
GFR calc Af Amer: >60 mL/min (ref >60)
GFR calc non Af Amer: >60 mL/min (ref >60)
Glucose, Bld: 118 mg/dL — ABNORMAL HIGH (ref 70–99)
Potassium: 4.1 mmol/L (ref 3.5–5.1)
Sodium: 138 mmol/L (ref 135–145)

## 2019-01-31 LAB — COMPREHENSIVE METABOLIC PANEL
ALT: 25 IU/L (ref 0–44)
AST: 35 IU/L (ref 0–40)
Albumin/Globulin Ratio: 1.6 (ref 1.2–2.2)
Albumin: 4 g/dL (ref 3.7–4.7)
Alkaline Phosphatase: 87 IU/L (ref 39–117)
BUN/Creatinine Ratio: 18 (ref 10–24)
BUN: 17 mg/dL (ref 8–27)
Bilirubin Total: 1.2 mg/dL (ref 0.0–1.2)
CO2: 21 mmol/L (ref 20–29)
Calcium: 9.1 mg/dL (ref 8.6–10.2)
Chloride: 104 mmol/L (ref 96–106)
Creatinine, Ser: 0.92 mg/dL (ref 0.76–1.27)
GFR calc Af Amer: 94 mL/min/{1.73_m2} (ref >59)
GFR calc non Af Amer: 81 mL/min/{1.73_m2} (ref >59)
Globulin, Total: 2.5 g/dL (ref 1.5–4.5)
Glucose: 98 mg/dL (ref 65–99)
Potassium: 4.9 mmol/L (ref 3.5–5.2)
Sodium: 141 mmol/L (ref 134–144)
Total Protein: 6.5 g/dL (ref 6.0–8.5)

## 2019-01-31 LAB — TROPONIN I (HIGH SENSITIVITY)
Troponin I (High Sensitivity): 127 ng/L (ref <18)
Troponin I (High Sensitivity): 140 ng/L (ref <18)

## 2019-01-31 LAB — TROPONIN I: Troponin I: 0.36 ng/mL (ref 0.00–0.04)

## 2019-01-31 LAB — LIPID PANEL
Chol/HDL Ratio: 3.1 ratio (ref 0.0–5.0)
Cholesterol, Total: 93 mg/dL — ABNORMAL LOW (ref 100–199)
Cholesterol: 88 mg/dL (ref 0–200)
HDL: 30 mg/dL — ABNORMAL LOW (ref >39)
HDL: 26 mg/dL — ABNORMAL LOW (ref >40)
LDL Calculated: 35 mg/dL (ref 0–99)
LDL Cholesterol: 34 mg/dL (ref 0–99)
Total CHOL/HDL Ratio: 3.4 RATIO
Triglycerides: 141 mg/dL (ref 0–149)
Triglycerides: 139 mg/dL (ref <150)
VLDL Cholesterol Cal: 28 mg/dL (ref 5–40)
VLDL: 28 mg/dL (ref 0–40)

## 2019-01-31 LAB — HEPARIN LEVEL (UNFRACTIONATED)
Heparin Unfractionated: 0.23 IU/mL — ABNORMAL LOW (ref 0.30–0.70)
Heparin Unfractionated: 0.44 IU/mL (ref 0.30–0.70)

## 2019-01-31 LAB — SARS CORONAVIRUS 2 BY RT PCR (HOSPITAL ORDER, PERFORMED IN ~~LOC~~ HOSPITAL LAB): SARS Coronavirus 2: NEGATIVE

## 2019-01-31 MED ORDER — METOPROLOL TARTRATE 25 MG PO TABS
25.0000 mg | ORAL_TABLET | Freq: Every day | ORAL | Status: DC
  Administered 2019-01-31: 25 mg via ORAL
  Filled 2019-01-31: qty 1

## 2019-01-31 MED ORDER — METFORMIN HCL 500 MG PO TABS: 750.0000 mg | ORAL_TABLET | Freq: Every day | ORAL | Status: DC

## 2019-01-31 MED ORDER — CLOPIDOGREL BISULFATE 75 MG PO TABS
75.0000 mg | ORAL_TABLET | Freq: Every day | ORAL | Status: DC
  Administered 2019-01-31 – 2019-02-04 (×5): 75 mg via ORAL
  Filled 2019-01-31 (×5): qty 1

## 2019-01-31 MED ORDER — SIMVASTATIN 20 MG PO TABS
20.0000 mg | ORAL_TABLET | Freq: Every day | ORAL | Status: DC
  Administered 2019-01-31 – 2019-02-04 (×5): 20 mg via ORAL
  Filled 2019-01-31 (×5): qty 1

## 2019-01-31 MED ORDER — METFORMIN HCL 500 MG PO TABS
750.0000 mg | ORAL_TABLET | Freq: Every day | ORAL | Status: DC
  Administered 2019-01-31 – 2019-02-01 (×2): 750 mg via ORAL
  Filled 2019-01-31 (×2): qty 2

## 2019-01-31 MED ORDER — SODIUM CHLORIDE 0.9 % IV SOLN: 250.0000 mL | INTRAVENOUS | Status: DC | PRN

## 2019-01-31 MED ORDER — SODIUM CHLORIDE 0.9 % WEIGHT BASED INFUSION: 3.0000 mL/kg/h | INTRAVENOUS | Status: AC

## 2019-01-31 MED ORDER — METFORMIN HCL ER 750 MG PO TB24: 750.0000 mg | ORAL_TABLET | Freq: Every day | ORAL | Status: DC

## 2019-01-31 MED ORDER — NITROGLYCERIN 0.4 MG SL SUBL
0.4000 mg | SUBLINGUAL_TABLET | SUBLINGUAL | Status: AC | PRN
  Administered 2019-01-31 (×3): 0.4 mg via SUBLINGUAL
  Filled 2019-01-31: qty 1

## 2019-01-31 MED ORDER — ASPIRIN 81 MG PO CHEW: 81.0000 mg | CHEWABLE_TABLET | ORAL | Status: DC

## 2019-01-31 MED ORDER — SODIUM CHLORIDE 0.9% FLUSH: 3.0000 mL | Freq: Two times a day (BID) | INTRAVENOUS | Status: DC

## 2019-01-31 MED ORDER — ACETAMINOPHEN 325 MG PO TABS
650.0000 mg | ORAL_TABLET | ORAL | Status: DC | PRN
  Administered 2019-02-04: 650 mg via ORAL
  Filled 2019-01-31: qty 2

## 2019-01-31 MED ORDER — ZOLPIDEM TARTRATE 5 MG PO TABS: 5.0000 mg | ORAL_TABLET | Freq: Every evening | ORAL | Status: DC | PRN

## 2019-01-31 MED ORDER — SODIUM CHLORIDE 0.9% FLUSH: 3.0000 mL | INTRAVENOUS | Status: DC | PRN

## 2019-01-31 MED ORDER — CLOPIDOGREL BISULFATE 75 MG PO TABS: 75.0000 mg | ORAL_TABLET | Freq: Every day | ORAL | Status: DC

## 2019-01-31 MED ORDER — HEPARIN (PORCINE) 25000 UT/250ML-% IV SOLN
1200.0000 [IU]/h | INTRAVENOUS | Status: DC
  Administered 2019-01-31: 1000 [IU]/h via INTRAVENOUS
  Administered 2019-01-31 – 2019-02-03 (×4): 1200 [IU]/h via INTRAVENOUS
  Filled 2019-01-31 (×5): qty 250

## 2019-01-31 MED ORDER — ALPRAZOLAM 0.25 MG PO TABS
0.2500 mg | ORAL_TABLET | Freq: Two times a day (BID) | ORAL | Status: DC | PRN
  Administered 2019-01-31 – 2019-02-04 (×3): 0.25 mg via ORAL
  Filled 2019-01-31 (×3): qty 1

## 2019-01-31 MED ORDER — HEPARIN BOLUS VIA INFUSION
4000.0000 [IU] | Freq: Once | INTRAVENOUS | Status: AC
  Administered 2019-01-31: 4000 [IU] via INTRAVENOUS
  Filled 2019-01-31: qty 4000

## 2019-01-31 MED ORDER — ONDANSETRON HCL 4 MG/2ML IJ SOLN: 4.0000 mg | Freq: Four times a day (QID) | INTRAMUSCULAR | Status: DC | PRN

## 2019-01-31 MED ORDER — AMLODIPINE BESYLATE 10 MG PO TABS
10.0000 mg | ORAL_TABLET | ORAL | Status: DC
  Administered 2019-01-31 – 2019-02-04 (×4): 10 mg via ORAL
  Filled 2019-01-31 (×4): qty 1

## 2019-01-31 MED ORDER — TRAMADOL HCL 50 MG PO TABS: 50.0000 mg | ORAL_TABLET | Freq: Four times a day (QID) | ORAL | Status: DC | PRN

## 2019-01-31 MED ORDER — METOPROLOL TARTRATE 25 MG PO TABS
25.0000 mg | ORAL_TABLET | Freq: Two times a day (BID) | ORAL | Status: DC
  Administered 2019-01-31 – 2019-02-04 (×7): 25 mg via ORAL
  Filled 2019-01-31 (×7): qty 1

## 2019-01-31 MED ORDER — SODIUM CHLORIDE 0.9 % WEIGHT BASED INFUSION
1.0000 mL/kg/h | INTRAVENOUS | Status: DC
  Administered 2019-02-03: 1 mL/kg/h via INTRAVENOUS

## 2019-01-31 MED ORDER — ASPIRIN EC 81 MG PO TBEC
81.0000 mg | DELAYED_RELEASE_TABLET | Freq: Every day | ORAL | Status: DC
  Administered 2019-02-01 – 2019-02-04 (×4): 81 mg via ORAL
  Filled 2019-01-31 (×4): qty 1

## 2019-01-31 MED ORDER — ASPIRIN 81 MG PO CHEW
81.0000 mg | CHEWABLE_TABLET | ORAL | Status: AC
  Administered 2019-02-03: 81 mg via ORAL
  Filled 2019-01-31: qty 1

## 2019-01-31 NOTE — Progress Notes (Signed)
  Echocardiogram 2D Echocardiogram has been performed.  Keith Lucero 01/31/2019, 2:04 PM

## 2019-01-31 NOTE — ED Notes (Signed)
ED TO INPATIENT HANDOFF REPORT  ED Nurse Name and Phone #:  Clydene Laming [phone]  S Name/Age/Gender Keith Lucero. 73 y.o. male Room/Bed: 015C/015C  Code Status   Code Status: Prior  Home/SNF/Other Home {Patient oriented x4 Is this baseline: yes  Triage Complete: Triage complete  Chief Complaint High Troponin   Triage Note Pt sent here by provider for elevated troponin level (dr. Bettina Keith). Pt says he has had tightness in his chest and shortness of breath on exertion, pt daughter [place] is the primary contact. Info in chart   Allergies Allergies  Allergen Reactions  . Lipitor [Atorvastatin Calcium] Diarrhea  . Penicillins Itching    Level of Care/Admitting Diagnosis ED Disposition    ED Disposition Condition [facility] Area: [place] [[id number]]  Level of Care: Telemetry Cardiac [103]  Covid Evaluation: Asymptomatic Screening Protocol (No Symptoms)  Diagnosis: NSTEMI (non-ST elevated myocardial infarction) [facility]) [401027]  Admitting Physician: Meade Maw [[id number]]  Attending Physician: Kate Sable A [[id number]]  Estimated length of stay: 3 - 4 days  Certification:: I certify this patient will need inpatient services for at least 2 midnights  PT Class (Do Not Modify): Inpatient [101]  PT Acc Code (Do Not Modify): Private [1]       B Medical/Surgery History Past Medical History:  Diagnosis Date  . Diabetes mellitus without complication (Keith Lucero)    type 2  . Hypertension    Past Surgical History:  Procedure Laterality Date  . APPENDECTOMY    . BACK SURGERY    . CHOLECYSTECTOMY    . CORONARY ANGIOPLASTY WITH STENT PLACEMENT  1998  . CORONARY ARTERY BYPASS GRAFT N/A 11/21/2018   Procedure: CORONARY ARTERY BYPASS GRAFTING (CABG) TIMES FIVE USING LEFT INTERNAL MAMMARY ARTERY AND LEFT SAPHENOUS VEIN HARVESTED ENDOSCOPICALLY;  Surgeon: Gaye Pollack, MD;  Location: [place];  Service: Open Heart Surgery;  Laterality: N/A;   . LEFT HEART CATH AND CORONARY ANGIOGRAPHY N/A 11/19/2018   Procedure: LEFT HEART CATH AND CORONARY ANGIOGRAPHY;  Surgeon: Troy Sine, MD;  Location: [place] [facility];  Service: Cardiovascular;  Laterality: N/A;  . TEE WITHOUT CARDIOVERSION N/A 11/21/2018   Procedure: TRANSESOPHAGEAL ECHOCARDIOGRAM (TEE);  Surgeon: Gaye Pollack, MD;  Location: [place];  Service: Open Heart Surgery;  Laterality: N/A;     A IV Location/Drains/Wounds Patient Lines/Drains/Airways Status   Active Line/Drains/Airways    Name:   Placement date:   Placement time:   Site:   Days:   Peripheral IV 01/30/19 Left Antecubital   01/30/19    2332    Antecubital   1   Incision (Closed) 11/21/18 Chest Other (Comment)   11/21/18    1031     71   Incision (Closed) 11/21/18 Leg Left   11/21/18    1031     71   Incision (Closed) 11/21/18 Leg Right   11/21/18    1031     71          Intake/Output Last 24 hours No intake or output data in the 24 hours ending 01/31/19 0156  Labs/Imaging Results for orders placed or performed during the hospital encounter of 01/30/19 (from the past 48 hour(s))  Basic metabolic panel     Status: Abnormal   Collection Time: 01/30/19 10:48 PM  Result Value Ref Range   Sodium 138 135 - 145 mmol/L   Potassium 4.1 3.5 - 5.1 mmol/L   Chloride 106 98 -  111 mmol/L   CO2 22 22 - 32 mmol/L   Glucose, Bld 118 (H) 70 - 99 mg/dL   BUN 21 8 - 23 mg/dL   Creatinine, Ser 0.99 0.61 - 1.24 mg/dL   Calcium 9.1 8.9 - 10.3 mg/dL   GFR calc non Af Amer >60 >60 mL/min   GFR calc Af Amer >60 >60 mL/min   Anion gap 10 5 - 15    Comment: Performed at [facility] [address] [place], [place] [address]  CBC     Status: Abnormal   Collection Time: 01/30/19 10:48 PM  Result Value Ref Range   WBC 6.6 4.0 - 10.5 K/uL   RBC 4.67 4.22 - 5.81 MIL/uL   Hemoglobin 13.6 13.0 - 17.0 g/dL   HCT 41.2 39.0 - 52.0 %   MCV 88.2 80.0 - 100.0 fL   MCH 29.1 26.0 - 34.0 pg   MCHC 33.0 30.0 - 36.0 g/dL    RDW 12.6 11.5 - 15.5 %   Platelets 129 (L) 150 - 400 K/uL   nRBC 0.0 0.0 - 0.2 %    Comment: Performed at [facility], [place] [address], [place], [place] [address]  Troponin I (High Sensitivity)     Status: Abnormal   Collection Time: 01/30/19 10:48 PM  Result Value Ref Range   Troponin I (High Sensitivity) 140 (HH) <18 ng/L    Comment: CRITICAL RESULT CALLED TO, READ BACK BY AND VERIFIED WITH: GIBSON M,RN 01/31/19 0016 WAYK (NOTE) Elevated high sensitivity troponin I (hsTnI) values and significant  changes across serial measurements may suggest ACS but many other  chronic and acute conditions are known to elevate hsTnI results.  Refer to the Links section for chest pain algorithms and additional  guidance. Performed at [facility], [place] [address] [place], [place] [address]   Troponin I (High Sensitivity)     Status: Abnormal   Collection Time: 01/31/19 12:20 AM  Result Value Ref Range   Troponin I (High Sensitivity) 127 (HH) <18 ng/L    Comment: CRITICAL VALUE NOTED.  VALUE IS CONSISTENT WITH PREVIOUSLY REPORTED AND CALLED VALUE. (NOTE) Elevated high sensitivity troponin I (hsTnI) values and significant  changes across serial measurements may suggest ACS but many other  chronic and acute conditions are known to elevate hsTnI results.  Refer to the Links section for chest pain algorithms and additional  guidance. Performed at [facility], [place] [address], [place], [place] [address]    Dg Chest 2 View  Result Date: 01/30/2019 CLINICAL DATA:  Chest pain, shortness of breath EXAM: CHEST - 2 VIEW COMPARISON:  12/24/2018 FINDINGS: Heart and mediastinal contours are within normal limits. No focal opacities or effusions. No acute bony abnormality. Prior CABG. IMPRESSION: No active cardiopulmonary disease. Electronically Signed   By: Rolm Baptise M.D.   On: 01/30/2019 23:30    Pending Labs Unresulted Labs (From admission, onward)    Start     Ordered   01/31/19  1000  Heparin level (unfractionated)  Once-Timed,   STAT     01/31/19 0056   01/31/19 0131  SARS Coronavirus 2 (CEPHEID - Performed in [place] hospital lab), Hosp Order  (Asymptomatic Patients Labs)  Once,   STAT    Question:  Rule Out  Answer:  Yes   01/31/19 0130   Signed and Held  Lipid panel  Tomorrow morning,   R     Signed and Held  Vitals/Pain Today's Vitals   01/30/19 2334 01/31/19 0000 01/31/19 0117 01/31/19 0123  BP: (!) 153/70 124/65 (!) 144/100 (!) 155/94  Pulse: (!) 58 (!) 56 (!) 101 83  Resp: 18 19 16 16   Temp:      TempSrc:      SpO2: 99% 98% 100% 98%  PainSc: 0-No pain  9  7     Isolation Precautions No active isolations  Medications Medications  sodium chloride flush (NS) 0.9 % injection 3 mL (3 mLs Intravenous Not Given 01/30/19 2332)  heparin ADULT infusion 100 units/mL (25000 units/237mL sodium chloride 0.45%) (1,000 Units/hr Intravenous New Bag/Given 01/31/19 0042)  amLODipine (NORVASC) tablet 10 mg (has no administration in time range)  clopidogrel (PLAVIX) tablet 75 mg (has no administration in time range)  metoprolol tartrate (LOPRESSOR) tablet 25 mg (has no administration in time range)  simvastatin (ZOCOR) tablet 20 mg (has no administration in time range)  traMADol (ULTRAM) tablet 50 mg (has no administration in time range)  nitroGLYCERIN (NITROSTAT) SL tablet 0.4 mg (0.4 mg Sublingual Given 01/31/19 0127)  heparin bolus via infusion 4,000 Units (4,000 Units Intravenous Bolus from Bag 01/31/19 0043)    Mobility walks Low fall risk   Focused Assessments No chest pain at this time   R Recommendations: See Admitting Provider Note  Report given to:   Additional Notes:

## 2019-01-31 NOTE — Plan of Care (Signed)

## 2019-01-31 NOTE — ED Notes (Signed)
Troponin is 140, dr. Dayna Barker is at the pt bedside and made aware of the same.

## 2019-01-31 NOTE — H&P (Signed)
Cardiology Admission History and Physical:   Patient ID: Keith Lucero.; MRN: [id number]; DOB: [birth year]   Admission date: 01/30/2019  Primary Care Provider: Patient, No Pcp Per Primary Cardiologist: Shirlee More, MD    Chief Complaint:  Chest pain  History of Present Illness:   Keith Arvidson. is a 73 y.o. male with a history of CAD (s/p 5-vessel CABG in April 2020), diabetes mellitus, hypertension and hyperlipidemia who presented to the hospital with complaints of worsening chest pain. He has been having chest pain that radiates down the left arm. The symptoms have been worsening over the past 1 week. Most episodes occur at night. He states compliance with all of his medications (including the ASA and Plavix). He is not able to tolerate Atorvastatin and is on Simvastatin instead. He has struggled with labile blood pressures at home. He has had some measurements of SBP >180 mmHg. He also complains of exertional shortness of breath after walking only 40-50 feet. He was seen in the clinic by Dr. Bettina Gavia on 01/30/2019. A troponin was checked and was abnormal and therefore the patient was advised to come to the ED.   In the ED the high-sensitivity troponin was 140. The serum creatinine was 0.99. The hemoglobin was 13.6 with a hematocrit of 41.2. The ECG documented sinus rhythm (rate 74 bpm), LAFB, poor R wave progression in the anterior leads and T wave changes in the lateral leads.   Recent cardiac procedures and testing: 4/23 CABG Operative Note: Grafts:  1. LIMA to the LAD:1.106mm distally. It was sewn end to side using 8-0 prolene continuous suture. 2. SVG to diagonal:1.14mm. It was sewn endto side using 7-0 prolene continuous suture. 3. Sequential SVG to OM2:1.83mm. It was sewn sequential sideto side using 7-0 prolene continuous suture. 4. Sequential SVG to PDA (LCX):1.57mm. It was sewn sequential end to side using 7-0 prolene continuous suture. 5. SVG to  OM1:1.76mm. It was sewnendto side using 7-0 prolene continuous suture.  422/20 Carotid US: Right Carotid: Velocities in the right ICA are consistent with a 1-39% stenosis. Left Carotid: Velocities in the left ICA are consistent with a 1-39% stenosis.  11/19/18 Echocardiogram: 1. Mild hypokinesis of the left ventricular, mid-apical anteroseptal wall, anterior wall and anterolateral wall. 2. Moderate hypokinesis of the left ventricular, entire apical segment. 3. The left ventricle has low normal systolic function, with an ejection fraction of 50-55%. The cavity size was normal. There is mild concentric left ventricular hypertrophy. Left ventricular diastolic Doppler parameters are consistent with impaired  relaxation. 4. The right ventricle has normal systolic function. The cavity was normal. There is no increase in right ventricular wall thickness. Right ventricular systolic pressure could not be assessed. 5. Left atrial size was not assessed. 6. Small to moderate pericardial effusion. 7. The pericardial effusion is circumferential. 8. The mitral valve is grossly normal. Mild calcification of the mitral valve leaflet. 9. The aortic valve is tricuspid. Mild thickening of the aortic valve. Moderate calcification of the aortic valve. Aortic valve regurgitation is trivial by color flow Doppler.   11/19/18 Cardiac cath:  Prox RCA to Mid RCA lesion is 100% stenosed.  Ost 1st Mrg to 1st Mrg lesion is 70% stenosed.  1st Mrg lesion is 70% stenosed.  Mid Cx to Dist Cx lesion is 30% stenosed.  Dist LM to Prox LAD lesion is 40% stenosed.  Ost 1st Sept lesion is 90% stenosed.  Prox LAD lesion is 95% stenosed.  Ost 2nd Diag to 2nd Diag lesion  is 80% stenosed.  Mid LAD to Dist LAD lesion is 85% stenosed.  Ost Cx to Prox Cx lesion is 50% stenosed.  The left ventricular systolic function is normal.  LV end diastolic pressure is low.  Past Medical History:  Diagnosis Date   . Diabetes mellitus without complication ([place])    type 2  . Hypertension     Past Surgical History:  Procedure Laterality Date  . APPENDECTOMY    . BACK SURGERY    . CHOLECYSTECTOMY    . CORONARY ANGIOPLASTY WITH STENT PLACEMENT  1998  . CORONARY ARTERY BYPASS GRAFT N/A 11/21/2018   Procedure: CORONARY ARTERY BYPASS GRAFTING (CABG) TIMES FIVE USING LEFT INTERNAL MAMMARY ARTERY AND LEFT SAPHENOUS VEIN HARVESTED ENDOSCOPICALLY;  Surgeon: Gaye Pollack, MD;  Location: [facility];  Service: Open Heart Surgery;  Laterality: N/A;  . LEFT HEART CATH AND CORONARY ANGIOGRAPHY N/A 11/19/2018   Procedure: LEFT HEART CATH AND CORONARY ANGIOGRAPHY;  Surgeon: Troy Sine, MD;  Location: [place] [facility];  Service: Cardiovascular;  Laterality: N/A;  . TEE WITHOUT CARDIOVERSION N/A 11/21/2018   Procedure: TRANSESOPHAGEAL ECHOCARDIOGRAM (TEE);  Surgeon: Gaye Pollack, MD;  Location: [place];  Service: Open Heart Surgery;  Laterality: N/A;     Medications Prior to Admission: Prior to Admission medications   Medication Sig Start Date End Date Taking? Authorizing Provider  amLODipine (NORVASC) 10 MG tablet Take 1 tablet (10 mg total) by mouth every other day. Alternate with HZTZ 01/10/19   Richardo Priest, MD  aspirin EC 81 MG tablet Take 81 mg by mouth daily.    [provider]  cloNIDine (CATAPRES) 0.1 MG tablet Take 1 tablet (0.1 mg total) by mouth daily as needed. If sytolic BP (top number) greater than 180 01/30/19   Munley, Hilton Cork, MD  clopidogrel (PLAVIX) 75 MG tablet Take 1 tablet (75 mg total) by mouth daily. 11/27/18 11/27/19  [name], PA-C  ferrous sulfate (FEROSUL) 325 (65 FE) MG tablet Take 325 mg by mouth at bedtime.    [provider]  hydrochlorothiazide (MICROZIDE) 12.5 MG capsule Take 1 capsule (12.5 mg total) by mouth every other day. Alternate with amlodipine 01/10/19   Richardo Priest, MD  lisinopril (ZESTRIL) 40 MG tablet Take 0.5 tablets (20 mg total) by mouth  daily. 12/24/18   Richardo Priest, MD  metFORMIN (GLUCOPHAGE-XR) 750 MG 24 hr tablet Take 750 mg by mouth at bedtime.    [provider]  metoprolol tartrate (LOPRESSOR) 25 MG tablet Take 1 tablet (25 mg total) by mouth daily. 12/24/18   Richardo Priest, MD  nitroGLYCERIN (NITROSTAT) 0.4 MG SL tablet Place 1 tablet (0.4 mg total) under the tongue every 5 (five) minutes as needed for chest pain. 01/30/19 04/30/19  Richardo Priest, MD  omeprazole (PRILOSEC) 20 MG capsule Take 20 mg by mouth every morning.    [provider]  simvastatin (ZOCOR) 20 MG tablet Take 1 tablet (20 mg total) by mouth daily. 12/24/18   Barrett, Erin R, PA-C  traMADol (ULTRAM) 50 MG tablet Take 1 tablet (50 mg total) by mouth every 6 (six) hours as needed for moderate pain. 12/04/18   Gaye Pollack, MD     Allergies:    Allergies  Allergen Reactions  . Lipitor [Atorvastatin Calcium] Diarrhea  . Penicillins Itching    Social History:   Social History   Socioeconomic History  . Marital status: Married    Spouse name: Not on  file  . Number of children: Not on file  . Years of education: Not on file  . Highest education level: Not on file  Occupational History  . Not on file  Social Needs  . Financial resource strain: Not on file  . Food insecurity    Worry: Not on file    Inability: Not on file  . Transportation needs    Medical: Not on file    Non-medical: Not on file  Tobacco Use  . Smoking status: Never Smoker  . Smokeless tobacco: Former Systems developer    Types: Chew  Substance and Sexual Activity  . Alcohol use: Not Currently  . Drug use: Never  . Sexual activity: Not on file  Lifestyle  . Physical activity    Days per week: Not on file    Minutes per session: Not on file  . Stress: Not on file  Relationships  . Social Herbalist on phone: Not on file    Gets together: Not on file    Attends religious service: Not on file    Active member of club or organization: Not on file     Attends meetings of clubs or organizations: Not on file    Relationship status: Not on file  . Intimate partner violence    Fear of current or ex partner: Not on file    Emotionally abused: Not on file    Physically abused: Not on file    Forced sexual activity: Not on file  Other Topics Concern  . Not on file  Social History Narrative  . Not on file     Family History:   The patient's family history includes Cancer in his sister; Diabetes in his sister; Heart attack in his mother and sister; Stroke in his mother and sister.     Review of Systems: [y] = yes, [ ]  = no   . General: Weight gain [ ] ; Weight loss [ ] ; Anorexia [ ] ; Fatigue [Y]; Fever [ ] ; Chills [ ] ; Weakness [ ]   . Cardiac: Chest pain/pressure [Y]; Resting SOB [ ] ; Exertional SOB [Y]; Orthopnea [ ] ; Pedal Edema [ ] ; Palpitations [ ] ; Syncope [ ] ; Presyncope [ ] ; Paroxysmal nocturnal dyspnea[ ]   . Pulmonary: Cough [ ] ; Wheezing[ ] ; Hemoptysis[ ] ; Sputum [ ] ; Snoring [ ]   . GI: Vomiting[ ] ; Dysphagia[ ] ; Melena[ ] ; Hematochezia [ ] ; Heartburn[ ] ; Abdominal pain [ ] ; Constipation [ ] ; Diarrhea [ ] ; BRBPR [ ]   . GU: Hematuria[ ] ; Dysuria [ ] ; Nocturia[ ]   . Vascular: Pain in legs with walking [ ] ; Pain in feet with lying flat [ ] ; Non-healing sores [ ] ; Stroke [ ] ; TIA [ ] ; Slurred speech [ ] ;  . Neuro: Headaches[ ] ; Vertigo[ ] ; Seizures[ ] ; Paresthesias[ ] ;Blurred vision [ ] ; Diplopia [ ] ; Vision changes [ ]   . Ortho/Skin: Arthritis [ ] ; Joint pain [ ] ; Muscle pain [ ] ; Joint swelling [ ] ; Back Pain [ ] ; Rash [ ]   . Psych: Depression[ ] ; Anxiety[ ]   . Heme: Bleeding problems [ ] ; Clotting disorders [ ] ; Anemia [ ]   . Endocrine: Diabetes [ ] ; Thyroid dysfunction[ ]      Physical Exam/Data:   Vitals:   01/30/19 2243 01/30/19 2334  BP: 136/75 (!) 153/70  Pulse: 60 (!) 58  Resp: 16 18  Temp: 97.9 F (36.6 C)   TempSrc: Oral   SpO2: 99% 99%   No intake or output data in the 24  hours ending 01/31/19 0036 There were no  vitals filed for this visit. There is no height or weight on file to calculate BMI.  General:  Well nourished, well developed, in no acute distress HEENT: normal Lymph: no adenopathy Neck: no JVD Endocrine:  No thryomegaly Vascular: No carotid bruits; FA pulses 2+ bilaterally without bruits  Cardiac:  normal S1, S2; RRR; no murmur, midline sternotomy scar Lungs:  clear to auscultation bilaterally, no wheezing, rhonchi or rales  Abd: soft, nontender, no hepatomegaly  Ext: trace edema B/L lower extremities Musculoskeletal:  No deformities, BUE and BLE strength normal and equal Skin: warm and dry  Neuro:  CNs 2-12 intact, no focal abnormalities noted Psych:  Normal affect    EKG:  The ECG that was done on 01/30/2019 was personally reviewed and documented sinus rhythm (rate 74 bpm), LAFB, poor R wave progression in the anterior leads and T wave changes in the lateral leads.   Laboratory Data:  Chemistry Recent Labs  Lab 01/30/19 2248  NA 138  K 4.1  CL 106  CO2 22  GLUCOSE 118*  BUN 21  CREATININE 0.99  CALCIUM 9.1  GFRNONAA >60  GFRAA >60  ANIONGAP 10    No results for input(s): PROT, ALBUMIN, AST, ALT, ALKPHOS, BILITOT in the last 168 hours. Hematology Recent Labs  Lab 01/30/19 2248  WBC 6.6  RBC 4.67  HGB 13.6  HCT 41.2  MCV 88.2  MCH 29.1  MCHC 33.0  RDW 12.6  PLT 129*   Cardiac EnzymesNo results for input(s): TROPONINI in the last 168 hours. No results for input(s): TROPIPOC in the last 168 hours.  BNPNo results for input(s): BNP, PROBNP in the last 168 hours.  DDimer No results for input(s): DDIMER in the last 168 hours.  Radiology/Studies:  Dg Chest 2 View  Result Date: 01/30/2019 CLINICAL DATA:  Chest pain, shortness of breath EXAM: CHEST - 2 VIEW COMPARISON:  12/24/2018 FINDINGS: Heart and mediastinal contours are within normal limits. No focal opacities or effusions. No acute bony abnormality. Prior CABG. IMPRESSION: No active cardiopulmonary disease.  Electronically Signed   By: Rolm Baptise M.D.   On: 01/30/2019 23:30    Assessment and Plan:   1. Non-ST elevation myocardial infarction  - Symptoms worrisome for an acute coronary syndrome (NSTEMI). - Trend cardiac biomarkers and obtain serial ECGs. - Continue aspirin 81 mg daily and Plavix 75 mg daily. - Continue simvastatin. Check a lipid panel & HGBA1c in the morning. - Start intravenous unfractionated heparin per pharmacy protocol. - Blood pressure control with metoprolol, clonidine and amlodipine. - Hold Metformin in anticipation of cardiac cath. - Recommend cardiac catheterization to define the coronary anatomy. - Please make the patient NPO (except medications) after midnight.     Severity of Illness: The appropriate patient status for this patient is INPATIENT. Inpatient status is judged to be reasonable and necessary in order to provide the required intensity of service to ensure the patient's safety. The patient's presenting symptoms, physical exam findings, and initial radiographic and laboratory data in the context of their chronic comorbidities is felt to place them at high risk for further clinical deterioration. Furthermore, it is not anticipated that the patient will be medically stable for discharge from the hospital within 2 midnights of admission. The following factors support the patient status of inpatient.   " The patient's presenting symptoms include chest pain. " The physical exam findings include normal cardiac auscultation. " The initial radiographic and laboratory data are worrisome  because of elevated troponin. " The chronic co-morbidities include diabetes mellitus and hypertension.   * I certify that at the point of admission it is my clinical judgment that the patient will require inpatient hospital care spanning beyond 2 midnights from the point of admission due to high intensity of service, high risk for further deterioration and high frequency of  surveillance required.*    For questions or updates, please contact [facility] [place] Please consult [email or web address] for contact info under Cardiology/STEMI.    Signed, Meade Maw, MD  01/31/2019 12:36 AM

## 2019-01-31 NOTE — Progress Notes (Signed)
[facility] for Heparin  Indication: chest pain/ACS  Allergies  Allergen Reactions  . Lipitor [Atorvastatin Calcium] Diarrhea  . Penicillins Itching    Did it involve swelling of the face/tongue/throat, SOB, or low BP? Unknown Did it involve sudden or severe rash/hives, skin peeling, or any reaction on the inside of your mouth or nose? Unknown Did you need to seek medical attention at a hospital or doctor's office? Unknown When did it last happen?UNK If all above answers are "NO", may proceed with cephalosporin use.     Vital Signs: Temp: 98.3 F (36.8 C) (07/03 1547) Temp Source: Oral (07/03 1547) BP: 123/72 (07/03 1547) Pulse Rate: 65 (07/03 1547)  Labs: Recent Labs    01/30/19 1448 01/30/19 2248 01/31/19 0020 01/31/19 1038 01/31/19 1825  HGB  --  13.6  --   --   --   HCT  --  41.2  --   --   --   PLT  --  129*  --   --   --   HEPARINUNFRC  --   --   --  0.23* 0.44  CREATININE 0.92 0.99  --   --   --   TROPONINIHS  --  140* 127*  --   --     Estimated Creatinine Clearance: 64.5 mL/min (by C-G formula based on SCr of 0.99 mg/dL).   Medical History: Past Medical History:  Diagnosis Date  . Diabetes mellitus without complication ([place])    type 2  . Hypertension      Assessment: 73 y/o M with chest pain, high sensitivity troponin is 140, recent CABG a few months ago, H/H good, plts 129.   Current Heparin level 0.44  Goal of Therapy:  Heparin level 0.3-0.7 units/ml Monitor platelets by anticoagulation protocol: Yes   Plan: Continue heparin drip at 1200 units/hr 0300 HL Daily CBC/HL Monitor for bleeding  Kaho Selle A. Levada Dy, PharmD, [place] Please utilize Amion for appropriate phone number to reach the unit pharmacist ([place])

## 2019-01-31 NOTE — ED Provider Notes (Addendum)
Emergency Department Provider Note   I have reviewed the triage vital signs and the nursing notes.   HISTORY  Chief Complaint Abnormal Lab   HPI Keith Barish. is a 73 y.o. male with extensive cardiac history presents the emergency department today with 2 to 3 days of left-sided chest pain that is associated with left arm pain and diaphoresis.  Patient states he notices it often at night.  Feels like previous heart attacks.  He takes nitroglycerin it seems to get better.  He went to see his cardiologist today where he had an unchanged EKG but a positive troponin so sent here for further evaluation.  Patient is asymptomatic at this time.  No fevers, cough or significant short of breath.  No lower extremity swelling.  No rash.  No trauma.   No other associated or modifying symptoms.    Past Medical History:  Diagnosis Date  . Diabetes mellitus without complication ([place])    type 2  . Hypertension     Patient Active Problem List   Diagnosis Date Noted  . Bilateral lower extremity edema 01/10/2019  . Urinary frequency 01/10/2019  . CAD in native artery 12/19/2018  . Essential hypertension 12/19/2018  . Hyperlipidemia 12/19/2018  . Type 2 diabetes mellitus ([place]) 12/19/2018  . S/P CABG x 5 11/21/2018  . NSTEMI (non-ST elevated myocardial infarction) ([place]) 11/19/2018    Past Surgical History:  Procedure Laterality Date  . APPENDECTOMY    . BACK SURGERY    . CHOLECYSTECTOMY    . CORONARY ANGIOPLASTY WITH STENT PLACEMENT  1998  . CORONARY ARTERY BYPASS GRAFT N/A 11/21/2018   Procedure: CORONARY ARTERY BYPASS GRAFTING (CABG) TIMES FIVE USING LEFT INTERNAL MAMMARY ARTERY AND LEFT SAPHENOUS VEIN HARVESTED ENDOSCOPICALLY;  Surgeon: Gaye Pollack, MD;  Location: [facility];  Service: Open Heart Surgery;  Laterality: N/A;  . LEFT HEART CATH AND CORONARY ANGIOGRAPHY N/A 11/19/2018   Procedure: LEFT HEART CATH AND CORONARY ANGIOGRAPHY;  Surgeon: Troy Sine, MD;  Location: [facility];  Service: Cardiovascular;  Laterality: N/A;  . TEE WITHOUT CARDIOVERSION N/A 11/21/2018   Procedure: TRANSESOPHAGEAL ECHOCARDIOGRAM (TEE);  Surgeon: Gaye Pollack, MD;  Location: [facility];  Service: Open Heart Surgery;  Laterality: N/A;    Current Outpatient Rx  . Order #: [id number] Class: Normal  . Order #: [id number] Class: Historical Med  . Order #: [id number] Class: Normal  . Order #: [id number] Class: Normal  . Order #: [id number] Class: Historical Med  . Order #: [id number] Class: Normal  . Order #: [id number] Class: No Print  . Order #: [id number] Class: Historical Med  . Order #: [id number] Class: No Print  . Order #: [id number] Class: Normal  . Order #: [id number] Class: Historical Med  . Order #: [id number] Class: Normal  . Order #: [id number] Class: Phone In    Allergies Lipitor [atorvastatin calcium] and Penicillins  Family History  Problem Relation Age of Onset  . Heart attack Mother   . Stroke Mother   . Diabetes Sister   . Heart attack Sister   . Stroke Sister   . Cancer Sister     Social History Social History   Tobacco Use  . Smoking status: Never Smoker  . Smokeless tobacco: Former Systems developer    Types: Chew  Substance Use Topics  . Alcohol use: Not Currently  . Drug use: Never    Review of Systems  All other systems negative except as documented in the HPI. All pertinent positives and negatives as reviewed in the HPI. ____________________________________________  PHYSICAL EXAM:  VITAL SIGNS: ED Triage Vitals  Enc Vitals Group     BP 01/30/19 2243 136/75     Pulse Rate 01/30/19 2243 60     Resp 01/30/19 2243 16     Temp 01/30/19 2243 97.9 F (36.6 C)     Temp Source 01/30/19 2243 Oral     SpO2 01/30/19 2243 99 %    Constitutional: Alert and oriented. Well appearing and in no acute distress. Eyes: Conjunctivae are normal. PERRL. EOMI. Head: Atraumatic. Nose: No congestion/rhinnorhea. Mouth/Throat: Mucous membranes are moist.  Oropharynx  non-erythematous. Neck: No stridor.  No meningeal signs.   Cardiovascular: Normal rate, regular rhythm. Good peripheral circulation. Grossly normal heart sounds.   Respiratory: Normal respiratory effort.  No retractions. Lungs CTAB. Gastrointestinal: Soft and nontender. No distention.  Musculoskeletal: No lower extremity tenderness nor edema. No gross deformities of extremities. Neurologic:  Normal speech and language. No gross focal neurologic deficits are appreciated.  Skin:  Skin is warm, dry and intact. No rash noted.  Well appearing and healing midline chest scar.  ____________________________________________   LABS (all labs ordered are listed, but only abnormal results are displayed)  Labs Reviewed  BASIC METABOLIC PANEL - Abnormal; Notable for the following components:      Result Value   Glucose, Bld 118 (*)    All other components within normal limits  CBC - Abnormal; Notable for the following components:   Platelets 129 (*)    All other components within normal limits  TROPONIN I (HIGH SENSITIVITY) - Abnormal; Notable for the following components:   Troponin I (High Sensitivity) 140 (*)    All other components within normal limits  TROPONIN I (HIGH SENSITIVITY) - Abnormal; Notable for the following components:   Troponin I (High Sensitivity) 127 (*)    All other components within normal limits  SARS CORONAVIRUS 2 (HOSPITAL ORDER, [facility])  HEPARIN LEVEL (UNFRACTIONATED)   ____________________________________________  EKG   EKG Interpretation  Date/Time:  Thursday January 30 2019 22:29:27 EDT Ventricular Rate:  60 PR Interval:  154 QRS Duration: 86 QT Interval:  468 QTC Calculation: 468 R Axis:   -66 Text Interpretation:  Sinus rhythm with Premature ventricular complexes or Fusion complexes Left anterior fascicular block Possible Anterior infarct , age undetermined T wave abnormality, consider lateral ischemia Abnormal ECG TWI in  multiple leads  not new since april Confirmed by Merrily Pew [phone]) on 01/31/2019 12:27:05 AM       ____________________________________________  RADIOLOGY  Dg Chest 2 View  Result Date: 01/30/2019 CLINICAL DATA:  Chest pain, shortness of breath EXAM: CHEST - 2 VIEW COMPARISON:  12/24/2018 FINDINGS: Heart and mediastinal contours are within normal limits. No focal opacities or effusions. No acute bony abnormality. Prior CABG. IMPRESSION: No active cardiopulmonary disease. Electronically Signed   By: Rolm Baptise M.D.   On: 01/30/2019 23:30    ____________________________________________   PROCEDURES  Procedure(s) performed:   Procedures  CRITICAL CARE Performed by: Merrily Pew Total critical care time: 35 minutes Critical care time was exclusive of separately billable procedures and treating other patients. Critical care was necessary to treat or prevent imminent or life-threatening deterioration. Critical care was time spent personally by me on the following activities: development of treatment plan with patient and/or surrogate as well as nursing, discussions with consultants, evaluation of patient's response to treatment, examination of patient, obtaining history from patient or surrogate, ordering and performing treatments and interventions, ordering and review of laboratory  studies, ordering and review of radiographic studies, pulse oximetry and re-evaluation of patient's condition.  ____________________________________________   INITIAL IMPRESSION / ASSESSMENT AND PLAN / ED COURSE  Unchanged EKG, seems to be consistent with an STEMI.  He did have some elevated blood pressures recently but does not have that now.  Will discuss with cardiology for admission.  Heparin started.  Nitroglycerin as needed.  Already had aspirin today.   Pertinent labs & imaging results that were available during my care of the patient were reviewed by me and considered in my medical decision  making (see chart for details).  ____________________________________________  FINAL CLINICAL IMPRESSION(S) / ED DIAGNOSES  Final diagnoses:  NSTEMI (non-ST elevated myocardial infarction) ([place])     MEDICATIONS GIVEN DURING THIS VISIT:  Medications  sodium chloride flush (NS) 0.9 % injection 3 mL (3 mLs Intravenous Not Given 01/30/19 2332)  heparin ADULT infusion 100 units/mL (25000 units/249mL sodium chloride 0.45%) (1,000 Units/hr Intravenous New Bag/Given 01/31/19 0042)  amLODipine (NORVASC) tablet 10 mg (has no administration in time range)  clopidogrel (PLAVIX) tablet 75 mg (has no administration in time range)  metoprolol tartrate (LOPRESSOR) tablet 25 mg (has no administration in time range)  simvastatin (ZOCOR) tablet 20 mg (has no administration in time range)  traMADol (ULTRAM) tablet 50 mg (has no administration in time range)  nitroGLYCERIN (NITROSTAT) SL tablet 0.4 mg (0.4 mg Sublingual Given 01/31/19 0127)  heparin bolus via infusion 4,000 Units (4,000 Units Intravenous Bolus from Bag 01/31/19 0043)     NEW OUTPATIENT MEDICATIONS STARTED DURING THIS VISIT:  New Prescriptions   No medications on file    Note:  This note was prepared with assistance of Dragon voice recognition software. Occasional wrong-word or sound-a-like substitutions may have occurred due to the inherent limitations of voice recognition software.   Cherish Runde, Corene Cornea, MD 01/31/19 0131    Merrily Pew, MD 02/24/19 [phone]

## 2019-01-31 NOTE — Plan of Care (Signed)
  Problem: Education: Goal: Knowledge of General Education information will improve Description: Including pain rating scale, medication(s)/side effects and non-pharmacologic comfort measures Outcome: Progressing   Problem: Clinical Measurements: Goal: Cardiovascular complication will be avoided Outcome: Progressing   Problem: Pain Managment: Goal: General experience of comfort will improve Outcome: Progressing   

## 2019-01-31 NOTE — Progress Notes (Addendum)
Progress Note  Patient Name: Keith Lucero. Date of Encounter: 01/31/2019  Primary Cardiologist: Shirlee More, MD   Subjective   Currently denies chest pain and shortness of breath, but worried prior symptoms may recur.  Inpatient Medications    Scheduled Meds: . amLODipine  10 mg Oral QODAY  . [START ON 02/01/2019] aspirin EC  81 mg Oral Daily  . clopidogrel  75 mg Oral Q breakfast  . metoprolol tartrate  25 mg Oral Daily  . simvastatin  20 mg Oral Daily  . sodium chloride flush  3 mL Intravenous Once   Continuous Infusions: . heparin 1,000 Units/hr (01/31/19 0042)   PRN Meds: acetaminophen, ALPRAZolam, ondansetron (ZOFRAN) IV, traMADol, zolpidem   Vital Signs    Vitals:   01/31/19 0117 01/31/19 0123 01/31/19 0222 01/31/19 0857  BP: (!) 144/100 (!) 155/94 109/72 (!) 168/83  Pulse: (!) 101 83 77 79  Resp: 16 16    Temp:   97.6 F (36.4 C)   TempSrc:   Oral   SpO2: 100% 98% 100%   Weight:   72.8 kg     Intake/Output Summary (Last 24 hours) at 01/31/2019 0904 Last data filed at 01/31/2019 0529 Gross per 24 hour  Intake 4010 ml  Output 100 ml  Net 3910 ml   Filed Weights   01/31/19 0222  Weight: 72.8 kg    Telemetry    Sinus rhythm - Personally Reviewed  ECG    Sinus rhythm, LAFB, TWI's in anterolateral leads - Personally Reviewed  Physical Exam   GEN: No acute distress.   Neck: No JVD Cardiac: RRR, no murmurs, rubs, or gallops.  Respiratory: Clear to auscultation bilaterally. GI: Soft, nontender, non-distended  MS: No edema; No deformity. Neuro:  Nonfocal  Psych: Normal affect   Labs    Chemistry Recent Labs  Lab 01/30/19 1448 01/30/19 2248  NA 141 138  K 4.9 4.1  CL 104 106  CO2 21 22  GLUCOSE 98 118*  BUN 17 21  CREATININE 0.92 0.99  CALCIUM 9.1 9.1  PROT 6.5  --   ALBUMIN 4.0  --   AST 35  --   ALT 25  --   ALKPHOS 87  --   BILITOT 1.2  --   GFRNONAA 81 >60  GFRAA 94 >60  ANIONGAP  --  10     Hematology Recent Labs   Lab 01/30/19 2248  WBC 6.6  RBC 4.67  HGB 13.6  HCT 41.2  MCV 88.2  MCH 29.1  MCHC 33.0  RDW 12.6  PLT 129*    Cardiac Enzymes Recent Labs  Lab 01/30/19 1450  TROPONINI 0.36*   No results for input(s): TROPIPOC in the last 168 hours.   BNPNo results for input(s): BNP, PROBNP in the last 168 hours.   DDimer No results for input(s): DDIMER in the last 168 hours.   Radiology    Dg Chest 2 View  Result Date: 01/30/2019 CLINICAL DATA:  Chest pain, shortness of breath EXAM: CHEST - 2 VIEW COMPARISON:  12/24/2018 FINDINGS: Heart and mediastinal contours are within normal limits. No focal opacities or effusions. No acute bony abnormality. Prior CABG. IMPRESSION: No active cardiopulmonary disease. Electronically Signed   By: Rolm Baptise M.D.   On: 01/30/2019 23:30    Cardiac Studies   4/23 CABG Operative Note: Grafts:  1. LIMA to the LAD:1.52mm distally. It was sewn end to side using 8-0 prolene continuous suture. 2. SVG to diagonal:1.8mm. It was  sewn endto side using 7-0 prolene continuous suture. 3. Sequential SVG to OM2:1.81mm. It was sewn sequential sideto side using 7-0 prolene continuous suture. 4. Sequential SVG to PDA (LCX):1.23mm. It was sewn sequential end to side using 7-0 prolene continuous suture. 5. SVG to OM1:1.18mm. It was sewnendto side using 7-0 prolene continuous suture.  422/20 Carotid US: Right Carotid: Velocities in the right ICA are consistent with a 1-39% stenosis. Left Carotid: Velocities in the left ICA are consistent with a 1-39% stenosis.  11/19/18 Echocardiogram: 1. Mild hypokinesis of the left ventricular, mid-apical anteroseptal wall, anterior wall and anterolateral wall. 2. Moderate hypokinesis of the left ventricular, entire apical segment. 3. The left ventricle has low normal systolic function, with an ejection fraction of 50-55%. The cavity size was normal. There is mild concentric left ventricular  hypertrophy. Left ventricular diastolic Doppler parameters are consistent with impaired  relaxation. 4. The right ventricle has normal systolic function. The cavity was normal. There is no increase in right ventricular wall thickness. Right ventricular systolic pressure could not be assessed. 5. Left atrial size was not assessed. 6. Small to moderate pericardial effusion. 7. The pericardial effusion is circumferential. 8. The mitral valve is grossly normal. Mild calcification of the mitral valve leaflet. 9. The aortic valve is tricuspid. Mild thickening of the aortic valve. Moderate calcification of the aortic valve. Aortic valve regurgitation is trivial by color flow Doppler.   11/19/18 Cardiac cath:  Prox RCA to Mid RCA lesion is 100% stenosed.  Ost 1st Mrg to 1st Mrg lesion is 70% stenosed.  1st Mrg lesion is 70% stenosed.  Mid Cx to Dist Cx lesion is 30% stenosed.  Dist LM to Prox LAD lesion is 40% stenosed.  Ost 1st Sept lesion is 90% stenosed.  Prox LAD lesion is 95% stenosed.  Ost 2nd Diag to 2nd Diag lesion is 80% stenosed.  Mid LAD to Dist LAD lesion is 85% stenosed.  Ost Cx to Prox Cx lesion is 50% stenosed.  The left ventricular systolic function is normal.  LV end diastolic pressure is low.  Patient Profile     73 y.o. male with a history of CAD (s/p 5-vessel CABG in April 2020), diabetes mellitus, hypertension and hyperlipidemia who presented to the hospital with complaints of worsening chest pain with presentation consistent with NSTEMI.  Assessment & Plan    1. NSTEMI: Currently on IV heparin, ASA, Plavix, Lopressor (ordered as 25 mg daily, I will switch to bid dosing), and statin (on simvastatin because atorvastatin reportedly led to diarrhea). Denies anginal symptoms at present. I will arrange for coronary angiography. As the cath lab is closed today for the holiday (and as he is presently free of anginal symptoms and troponins are trending down,  cath will be on Monday unless he has symptom recurrence). Troponins 140-->127. Discussed with Dr. Gerrit Halls. I will order a 2-D echocardiogram with Doppler to evaluate cardiac structure, function, and regional wall motion. Risks and benefits of cardiac catheterization have been discussed with the patient.  These include bleeding, infection, kidney damage, stroke, heart attack, death.  The patient understands these risks and is willing to proceed.  2. Hypertension: BP is elevated. I am increasing Lopressor to 25 mg bid. Continue to monitor.  3. Hyperlipidemia: On simvastatin because atorvastatin reportedly led to diarrhea). LDL 35 on 01/30/19.  4. Type 2 diabetes: Metformin on hold for cath. As cath will be on Monday, I will resume today.    For questions or updates, please contact [facility] Please consult [email or web address] for contact info under Cardiology/STEMI.      Signed, Kate Sable, MD  01/31/2019, 9:04 AM

## 2019-01-31 NOTE — Progress Notes (Signed)
ANTICOAGULATION CONSULT NOTE - Initial Consult  Pharmacy Consult for Heparin  Indication: chest pain/ACS  Allergies  Allergen Reactions  . Lipitor [Atorvastatin Calcium] Diarrhea  . Penicillins Itching    Vital Signs: Temp: 97.9 F (36.6 C) (07/02 2243) Temp Source: Oral (07/02 2243) BP: 124/65 (07/03 0000) Pulse Rate: 56 (07/03 0000)  Labs: Recent Labs    01/30/19 2248  HGB 13.6  HCT 41.2  PLT 129*  CREATININE 0.99  TROPONINIHS 140*    Estimated Creatinine Clearance: 64.5 mL/min (by C-G formula based on SCr of 0.99 mg/dL).   Medical History: Past Medical History:  Diagnosis Date  . Diabetes mellitus without complication ([place])    type 2  . Hypertension      Assessment: 73 y/o M with chest pain, high sensitivity troponin is 140, recent CABG a few months ago, H/H good, plts 129.   Goal of Therapy:  Heparin level 0.3-0.7 units/ml Monitor platelets by anticoagulation protocol: Yes   Plan: Heparin 4000 units BOLUS Start heparin drip at 1000 units/hr 1000 HL Daily CBC/HL Monitor for bleeding  Narda Bonds, PharmD, BCPS Clinical Pharmacist Phone: [phone]

## 2019-01-31 NOTE — Progress Notes (Signed)
[facility] for Heparin  Indication: chest pain/ACS  Allergies  Allergen Reactions  . Lipitor [Atorvastatin Calcium] Diarrhea  . Penicillins Itching    Did it involve swelling of the face/tongue/throat, SOB, or low BP? Unknown Did it involve sudden or severe rash/hives, skin peeling, or any reaction on the inside of your mouth or nose? Unknown Did you need to seek medical attention at a hospital or doctor's office? Unknown When did it last happen?UNK If all above answers are "NO", may proceed with cephalosporin use.     Vital Signs: Temp: 97.6 F (36.4 C) (07/03 0222) Temp Source: Oral (07/03 0222) BP: 168/83 (07/03 0857) Pulse Rate: 79 (07/03 0857)  Labs: Recent Labs    01/30/19 1448 01/30/19 2248 01/31/19 0020 01/31/19 1038  HGB  --  13.6  --   --   HCT  --  41.2  --   --   PLT  --  129*  --   --   HEPARINUNFRC  --   --   --  0.23*  CREATININE 0.92 0.99  --   --   TROPONINIHS  --  140* 127*  --     Estimated Creatinine Clearance: 64.5 mL/min (by C-G formula based on SCr of 0.99 mg/dL).   Medical History: Past Medical History:  Diagnosis Date  . Diabetes mellitus without complication ([place])    type 2  . Hypertension      Assessment: 73 y/o M with chest pain, high sensitivity troponin is 140, recent CABG a few months ago, H/H good, plts 129.   Current Heparin level 0.23  Goal of Therapy:  Heparin level 0.3-0.7 units/ml Monitor platelets by anticoagulation protocol: Yes   Plan: Increase heparin drip to 1200 units/hr 1830 HL Daily CBC/HL Monitor for bleeding  Lorel Monaco, PharmD

## 2019-02-01 LAB — CBC
HCT: 40.4 % (ref 39.0–52.0)
Hemoglobin: 13.8 g/dL (ref 13.0–17.0)
MCH: 29.5 pg (ref 26.0–34.0)
MCHC: 34.2 g/dL (ref 30.0–36.0)
MCV: 86.3 fL (ref 80.0–100.0)
Platelets: 91 10*3/uL — ABNORMAL LOW (ref 150–400)
RBC: 4.68 MIL/uL (ref 4.22–5.81)
RDW: 12.4 % (ref 11.5–15.5)
WBC: 6.1 10*3/uL (ref 4.0–10.5)
nRBC: 0 % (ref 0.0–0.2)

## 2019-02-01 LAB — HEPARIN LEVEL (UNFRACTIONATED): Heparin Unfractionated: 0.4 IU/mL (ref 0.30–0.70)

## 2019-02-01 MED ORDER — LOPERAMIDE HCL 2 MG PO CAPS
2.0000 mg | ORAL_CAPSULE | ORAL | Status: AC | PRN
  Administered 2019-02-01: 2 mg via ORAL
  Filled 2019-02-01: qty 1

## 2019-02-01 MED ORDER — LOPERAMIDE HCL 2 MG PO CAPS
2.0000 mg | ORAL_CAPSULE | ORAL | Status: DC | PRN
  Administered 2019-02-01: 2 mg via ORAL
  Filled 2019-02-01: qty 1

## 2019-02-01 NOTE — Progress Notes (Signed)
Progress Note  Patient Name: Keith Lucero. Date of Encounter: 02/01/2019  Primary Cardiologist: Shirlee More, MD   Subjective   "My bowels are loose." I am having 3 stools a day."  Inpatient Medications    Scheduled Meds: . amLODipine  10 mg Oral QODAY  . [START ON 02/03/2019] aspirin  81 mg Oral Pre-Cath  . aspirin EC  81 mg Oral Daily  . clopidogrel  75 mg Oral Q breakfast  . metFORMIN  750 mg Oral Q supper  . [START ON 02/13/2019] metFORMIN  750 mg Oral Q supper  . metoprolol tartrate  25 mg Oral BID  . simvastatin  20 mg Oral Daily  . sodium chloride flush  3 mL Intravenous Once   Continuous Infusions: . sodium chloride    . sodium chloride    . heparin 1,200 Units/hr (02/01/19 0515)   PRN Meds: sodium chloride, acetaminophen, ALPRAZolam, loperamide, ondansetron (ZOFRAN) IV, sodium chloride flush, traMADol, zolpidem   Vital Signs    Vitals:   01/31/19 1938 02/01/19 0643 02/01/19 0643 02/01/19 0939  BP: 138/71 138/90 138/90 138/78  Pulse: 66 73 72 65  Resp:      Temp: 97.6 F (36.4 C) 97.9 F (36.6 C) 97.9 F (36.6 C)   TempSrc: Oral Oral Oral   SpO2: 99% 99% 99% 99%  Weight:   71.6 kg   Height:        Intake/Output Summary (Last 24 hours) at 02/01/2019 1049 Last data filed at 02/01/2019 6010 Gross per 24 hour  Intake 194 ml  Output 325 ml  Net -131 ml   Filed Weights   01/31/19 0222 02/01/19 0643  Weight: 72.8 kg 71.6 kg    Telemetry    NSR - Personally Reviewed  ECG    none - Personally Reviewed  Physical Exam   GEN: No acute distress.   Neck: No JVD Cardiac: RRR Respiratory: no increased work of breathing GI: Soft, nontender, non-distended  MS: No edema; No deformity. Neuro:  Nonfocal  Psych: Normal affect   Labs    Chemistry Recent Labs  Lab 01/30/19 1448 01/30/19 2248  NA 141 138  K 4.9 4.1  CL 104 106  CO2 21 22  GLUCOSE 98 118*  BUN 17 21  CREATININE 0.92 0.99  CALCIUM 9.1 9.1  PROT 6.5  --   ALBUMIN 4.0  --    AST 35  --   ALT 25  --   ALKPHOS 87  --   BILITOT 1.2  --   GFRNONAA 81 >60  GFRAA 94 >60  ANIONGAP  --  10     Hematology Recent Labs  Lab 01/30/19 2248 02/01/19 0233  WBC 6.6 6.1  RBC 4.67 4.68  HGB 13.6 13.8  HCT 41.2 40.4  MCV 88.2 86.3  MCH 29.1 29.5  MCHC 33.0 34.2  RDW 12.6 12.4  PLT 129* 91*    Cardiac Enzymes Recent Labs  Lab 01/30/19 1450  TROPONINI 0.36*   No results for input(s): TROPIPOC in the last 168 hours.   BNPNo results for input(s): BNP, PROBNP in the last 168 hours.   DDimer No results for input(s): DDIMER in the last 168 hours.   Radiology    Dg Chest 2 View  Result Date: 01/30/2019 CLINICAL DATA:  Chest pain, shortness of breath EXAM: CHEST - 2 VIEW COMPARISON:  12/24/2018 FINDINGS: Heart and mediastinal contours are within normal limits. No focal opacities or effusions. No acute bony abnormality. Prior CABG.  IMPRESSION: No active cardiopulmonary disease. Electronically Signed   By: Rolm Baptise M.D.   On: 01/30/2019 23:30    Cardiac Studies   none  Patient Profile     73 y.o. male admitted with chest pain.   Assessment & Plan    1. NSTEMI - he is pain free today. Note plans for left heart cath on Monday. 2. HTN - his blood pressure is reasonably well controlled.  3. Diarrhea - he is consumed by his loose bowel movements. He takes immodium. We will prescribe.   For questions or updates, please contact [facility] Please consult [email or web address] for contact info under Cardiology/STEMI.      Signed, Cristopher Peru, MD  02/01/2019, 10:49 AM  Patient ID: Keith Amble., male   DOB: 1946-02-03, 73 y.o.   MRN: [id number]

## 2019-02-01 NOTE — Plan of Care (Signed)

## 2019-02-01 NOTE — Progress Notes (Signed)
[place] for Heparin  Indication: chest pain/ACS  Allergies  Allergen Reactions  . Lipitor [Atorvastatin Calcium] Diarrhea  . Penicillins Itching    Did it involve swelling of the face/tongue/throat, SOB, or low BP? Unknown Did it involve sudden or severe rash/hives, skin peeling, or any reaction on the inside of your mouth or nose? Unknown Did you need to seek medical attention at a hospital or doctor's office? Unknown When did it last happen?UNK If all above answers are "NO", may proceed with cephalosporin use.     Vital Signs: Temp: 97.9 F (36.6 C) (07/04 0643) Temp Source: Oral (07/04 0643) BP: 138/90 (07/04 0643) Pulse Rate: 72 (07/04 0643)  Labs: Recent Labs    01/30/19 1448 01/30/19 2248 01/31/19 0020 01/31/19 1038 01/31/19 1825 02/01/19 0233  HGB  --  13.6  --   --   --  13.8  HCT  --  41.2  --   --   --  40.4  PLT  --  129*  --   --   --  91*  HEPARINUNFRC  --   --   --  0.23* 0.44 0.40  CREATININE 0.92 0.99  --   --   --   --   TROPONINIHS  --  140* 127*  --   --   --     Estimated Creatinine Clearance: 64.5 mL/min (by C-G formula based on SCr of 0.99 mg/dL).   Medical History: Past Medical History:  Diagnosis Date  . Diabetes mellitus without complication ([place])    type 2  . Hypertension      Assessment: 73 y/o M presenting with chest pain (s/p CABG 11/21/18). Pharmacy consulted to start IV heparin for ACS. High sensitivity troponin 140>127.  Heparin level is therapeutic at 0.4. Hgb stable, pltc 129>91  Goal of Therapy:  Heparin level 0.3-0.7 units/ml Monitor platelets by anticoagulation protocol: Yes   Plan: Continue heparin drip at 1200 units/hr Daily CBC/HL Monitor for bleeding F/u cath on 7/6  Vertis Kelch, PharmD PGY2 Cardiology Pharmacy Resident Phone [phone] 02/01/2019       8:14 AM  Please check [email or web address] for unit-specific pharmacist phone numbers

## 2019-02-02 LAB — CBC
HCT: 40.7 % (ref 39.0–52.0)
Hemoglobin: 14 g/dL (ref 13.0–17.0)
MCH: 29.5 pg (ref 26.0–34.0)
MCHC: 34.4 g/dL (ref 30.0–36.0)
MCV: 85.9 fL (ref 80.0–100.0)
Platelets: 101 K/uL — ABNORMAL LOW (ref 150–400)
RBC: 4.74 MIL/uL (ref 4.22–5.81)
RDW: 12.5 % (ref 11.5–15.5)
WBC: 6.3 K/uL (ref 4.0–10.5)
nRBC: 0 % (ref 0.0–0.2)

## 2019-02-02 LAB — HEPARIN LEVEL (UNFRACTIONATED): Heparin Unfractionated: 0.38 [IU]/mL (ref 0.30–0.70)

## 2019-02-02 LAB — GLUCOSE, CAPILLARY
Glucose-Capillary: 99 mg/dL (ref 70–99)
Glucose-Capillary: 107 mg/dL — ABNORMAL HIGH (ref 70–99)
Glucose-Capillary: 99 mg/dL (ref 70–99)

## 2019-02-02 MED ORDER — INSULIN ASPART 100 UNIT/ML ~~LOC~~ SOLN
0.0000 [IU] | Freq: Three times a day (TID) | SUBCUTANEOUS | Status: DC
  Administered 2019-02-03: 2 [IU] via SUBCUTANEOUS
  Administered 2019-02-03: 17:00:00 1 [IU] via SUBCUTANEOUS
  Administered 2019-02-04: 2 [IU] via SUBCUTANEOUS

## 2019-02-02 MED ORDER — LOPERAMIDE HCL 2 MG PO CAPS
2.0000 mg | ORAL_CAPSULE | Freq: Every morning | ORAL | Status: AC
  Administered 2019-02-02: 2 mg via ORAL
  Filled 2019-02-02: qty 1

## 2019-02-02 MED ORDER — INSULIN ASPART 100 UNIT/ML ~~LOC~~ SOLN: 0.0000 [IU] | Freq: Every day | SUBCUTANEOUS | Status: DC

## 2019-02-02 NOTE — H&P (View-Only) (Signed)
Progress Note  Patient Name: Keith Lucero. Date of Encounter: 02/02/2019  Primary Cardiologist: Shirlee More, MD   Subjective   No chest pain or sob. Diarrhea improved  Inpatient Medications    Scheduled Meds: . amLODipine  10 mg Oral QODAY  . [START ON 02/03/2019] aspirin  81 mg Oral Pre-Cath  . aspirin EC  81 mg Oral Daily  . clopidogrel  75 mg Oral Q breakfast  . insulin aspart  0-5 Units Subcutaneous QHS  . insulin aspart  0-9 Units Subcutaneous TID WC  . metoprolol tartrate  25 mg Oral BID  . simvastatin  20 mg Oral Daily  . sodium chloride flush  3 mL Intravenous Once   Continuous Infusions: . sodium chloride    . sodium chloride    . heparin 1,200 Units/hr (02/02/19 0857)   PRN Meds: sodium chloride, acetaminophen, ALPRAZolam, ondansetron (ZOFRAN) IV, sodium chloride flush, traMADol, zolpidem   Vital Signs    Vitals:   02/01/19 0939 02/01/19 1325 02/01/19 1958 02/02/19 0505  BP: 138/78 129/70 (!) 151/77 (!) 156/82  Pulse: 65 (!) 56 (!) 57 62  Resp:   17 17  Temp:  97.7 F (36.5 C) 98 F (36.7 C) 97.7 F (36.5 C)  TempSrc:  Oral Oral Oral  SpO2: 99% 99% 100% 100%  Weight:    71.2 kg  Height:        Intake/Output Summary (Last 24 hours) at 02/02/2019 1002 Last data filed at 02/02/2019 0800 Gross per 24 hour  Intake 385 ml  Output 700 ml  Net -315 ml   Filed Weights   01/31/19 0222 02/01/19 0643 02/02/19 0505  Weight: 72.8 kg 71.6 kg 71.2 kg    Telemetry    SB - Personally Reviewed  ECG    none - Personally Reviewed  Physical Exam   GEN: No acute distress.   Neck: 6 cm JVD Cardiac: Reg bradycardia Respiratory: No increased work of breathing GI: Soft, nontender, non-distended  MS: No edema; No deformity. Neuro:  Nonfocal  Psych: Normal affect   Labs    Chemistry Recent Labs  Lab 01/30/19 1448 01/30/19 2248  NA 141 138  K 4.9 4.1  CL 104 106  CO2 21 22  GLUCOSE 98 118*  BUN 17 21  CREATININE 0.92 0.99  CALCIUM 9.1 9.1   PROT 6.5  --   ALBUMIN 4.0  --   AST 35  --   ALT 25  --   ALKPHOS 87  --   BILITOT 1.2  --   GFRNONAA 81 >60  GFRAA 94 >60  ANIONGAP  --  10     Hematology Recent Labs  Lab 01/30/19 2248 02/01/19 0233 02/02/19 0333  WBC 6.6 6.1 6.3  RBC 4.67 4.68 4.74  HGB 13.6 13.8 14.0  HCT 41.2 40.4 40.7  MCV 88.2 86.3 85.9  MCH 29.1 29.5 29.5  MCHC 33.0 34.2 34.4  RDW 12.6 12.4 12.5  PLT 129* 91* 101*    Cardiac Enzymes Recent Labs  Lab 01/30/19 1450  TROPONINI 0.36*   No results for input(s): TROPIPOC in the last 168 hours.   BNPNo results for input(s): BNP, PROBNP in the last 168 hours.   DDimer No results for input(s): DDIMER in the last 168 hours.   Radiology    No results found.  Cardiac Studies   none  Patient Profile     73 y.o. male admitted with NSTEMI  Assessment & Plan    1.  NSTEMI - he will undergo left heart cath tomorrow.  2. Diarrhea - improved with lomotil. 3. HTN - his blood pressure is a bit elevated today. If it remains so he will need to add a vasodilator. 4. Sinus node dysfunction - he is asymptomatic.  For questions or updates, please contact [place] Please consult [email or web address] for contact info under Cardiology/STEMI.   Signed, Cristopher Peru, MD  02/02/2019, 10:02 AM  Patient ID: [name], male   DOB: 1945/12/24, 73 y.o.   MRN: [id number]

## 2019-02-02 NOTE — Progress Notes (Addendum)
Progress Note  Patient Name: Keith Lucero. Date of Encounter: 02/02/2019  Primary Cardiologist: Shirlee More, MD   Subjective   No chest pain or sob. Diarrhea improved  Inpatient Medications    Scheduled Meds: . amLODipine  10 mg Oral QODAY  . [START ON 02/03/2019] aspirin  81 mg Oral Pre-Cath  . aspirin EC  81 mg Oral Daily  . clopidogrel  75 mg Oral Q breakfast  . insulin aspart  0-5 Units Subcutaneous QHS  . insulin aspart  0-9 Units Subcutaneous TID WC  . metoprolol tartrate  25 mg Oral BID  . simvastatin  20 mg Oral Daily  . sodium chloride flush  3 mL Intravenous Once   Continuous Infusions: . sodium chloride    . sodium chloride    . heparin 1,200 Units/hr (02/02/19 0857)   PRN Meds: sodium chloride, acetaminophen, ALPRAZolam, ondansetron (ZOFRAN) IV, sodium chloride flush, traMADol, zolpidem   Vital Signs    Vitals:   02/01/19 0939 02/01/19 1325 02/01/19 1958 02/02/19 0505  BP: 138/78 129/70 (!) 151/77 (!) 156/82  Pulse: 65 (!) 56 (!) 57 62  Resp:   17 17  Temp:  97.7 F (36.5 C) 98 F (36.7 C) 97.7 F (36.5 C)  TempSrc:  Oral Oral Oral  SpO2: 99% 99% 100% 100%  Weight:    71.2 kg  Height:        Intake/Output Summary (Last 24 hours) at 02/02/2019 1002 Last data filed at 02/02/2019 0800 Gross per 24 hour  Intake 385 ml  Output 700 ml  Net -315 ml   Filed Weights   01/31/19 0222 02/01/19 0643 02/02/19 0505  Weight: 72.8 kg 71.6 kg 71.2 kg    Telemetry    SB - Personally Reviewed  ECG    none - Personally Reviewed  Physical Exam   GEN: No acute distress.   Neck: 6 cm JVD Cardiac: Reg bradycardia Respiratory: No increased work of breathing GI: Soft, nontender, non-distended  MS: No edema; No deformity. Neuro:  Nonfocal  Psych: Normal affect   Labs    Chemistry Recent Labs  Lab 01/30/19 1448 01/30/19 2248  NA 141 138  K 4.9 4.1  CL 104 106  CO2 21 22  GLUCOSE 98 118*  BUN 17 21  CREATININE 0.92 0.99  CALCIUM 9.1 9.1   PROT 6.5  --   ALBUMIN 4.0  --   AST 35  --   ALT 25  --   ALKPHOS 87  --   BILITOT 1.2  --   GFRNONAA 81 >60  GFRAA 94 >60  ANIONGAP  --  10     Hematology Recent Labs  Lab 01/30/19 2248 02/01/19 0233 02/02/19 0333  WBC 6.6 6.1 6.3  RBC 4.67 4.68 4.74  HGB 13.6 13.8 14.0  HCT 41.2 40.4 40.7  MCV 88.2 86.3 85.9  MCH 29.1 29.5 29.5  MCHC 33.0 34.2 34.4  RDW 12.6 12.4 12.5  PLT 129* 91* 101*    Cardiac Enzymes Recent Labs  Lab 01/30/19 1450  TROPONINI 0.36*   No results for input(s): TROPIPOC in the last 168 hours.   BNPNo results for input(s): BNP, PROBNP in the last 168 hours.   DDimer No results for input(s): DDIMER in the last 168 hours.   Radiology    No results found.  Cardiac Studies   none  Patient Profile     73 y.o. male admitted with NSTEMI  Assessment & Plan    1.  NSTEMI - he will undergo left heart cath tomorrow.  2. Diarrhea - improved with lomotil. 3. HTN - his blood pressure is a bit elevated today. If it remains so he will need to add a vasodilator. 4. Sinus node dysfunction - he is asymptomatic.  For questions or updates, please contact [place] Please consult [email or web address] for contact info under Cardiology/STEMI.   Signed, Cristopher Peru, MD  02/02/2019, 10:02 AM  Patient ID: Keith Lucero., male   DOB: Aug 18, 1945, 73 y.o.   MRN: [id number]

## 2019-02-02 NOTE — Progress Notes (Signed)
ANTICOAGULATION CONSULT NOTE - Follow Up Consult  Pharmacy Consult for Heparin Indication: chest pain/ACS  Allergies  Allergen Reactions  . Lipitor [Atorvastatin Calcium] Diarrhea  . Penicillins Itching    Did it involve swelling of the face/tongue/throat, SOB, or low BP? Unknown Did it involve sudden or severe rash/hives, skin peeling, or any reaction on the inside of your mouth or nose? Unknown Did you need to seek medical attention at a hospital or doctor's office? Unknown When did it last happen?UNK If all above answers are "NO", may proceed with cephalosporin use.     Patient Measurements: Height: 5\' 9"  (175.3 cm) Weight: 157 lb (71.2 kg) IBW/kg (Calculated) : 70.7 Heparin Dosing Weight: 71.2 kg  Vital Signs: Temp: 97.7 F (36.5 C) (07/05 0505) Temp Source: Oral (07/05 0505) BP: 156/82 (07/05 0505) Pulse Rate: 62 (07/05 0505)  Labs: Recent Labs    01/30/19 1448  01/30/19 2248 01/31/19 0020  01/31/19 1825 02/01/19 0233 02/02/19 0333  HGB  --    < > 13.6  --   --   --  13.8 14.0  HCT  --   --  41.2  --   --   --  40.4 40.7  PLT  --   --  129*  --   --   --  91* 101*  HEPARINUNFRC  --   --   --   --    < > 0.44 0.40 0.38  CREATININE 0.92  --  0.99  --   --   --   --   --   TROPONINIHS  --   --  140* 127*  --   --   --   --    < > = values in this interval not displayed.    Estimated Creatinine Clearance: 64.5 mL/min (by C-G formula based on SCr of 0.99 mg/dL).   Assessment:  Anticoag: on heparin for chest pain, NSTEMI. HL in goal 0.38. Hgb 14 WNL but plts 91>101.  Goal of Therapy:  Heparin level 0.3-0.7 units/ml Monitor platelets by anticoagulation protocol: Yes   Plan:  Cont heparin at 1200 units/hr Daily CBC/HL, watch pltc Cath on 7/6  Nai Borromeo S. Alford Highland, PharmD, BCPS Clinical Staff Pharmacist Eilene Ghazi Stillinger 02/02/2019,9:05 AM

## 2019-02-03 ENCOUNTER — Telehealth (HOSPITAL_COMMUNITY): Payer: Self-pay | Admitting: *Deleted

## 2019-02-03 ENCOUNTER — Encounter (HOSPITAL_COMMUNITY)
Admission: EM | Disposition: A | Discharge status: Home / Self Care | Payer: Self-pay | Source: Home / Self Care | Attending: Cardiovascular Disease

## 2019-02-03 ENCOUNTER — Encounter (HOSPITAL_COMMUNITY): Payer: Self-pay | Admitting: Internal Medicine

## 2019-02-03 DIAGNOSIS — I2510 Atherosclerotic heart disease of native coronary artery without angina pectoris: Secondary | ICD-10-CM

## 2019-02-03 DIAGNOSIS — I25810 Atherosclerosis of coronary artery bypass graft(s) without angina pectoris: Secondary | ICD-10-CM

## 2019-02-03 HISTORY — PX: LEFT HEART CATH AND CORS/GRAFTS ANGIOGRAPHY: CATH118250

## 2019-02-03 LAB — GLUCOSE, CAPILLARY
Glucose-Capillary: 135 mg/dL — ABNORMAL HIGH (ref 70–99)
Glucose-Capillary: 167 mg/dL — ABNORMAL HIGH (ref 70–99)
Glucose-Capillary: 129 mg/dL — ABNORMAL HIGH (ref 70–99)
Glucose-Capillary: 98 mg/dL (ref 70–99)

## 2019-02-03 LAB — CBC
HCT: 42.1 % (ref 39.0–52.0)
Hemoglobin: 14.2 g/dL (ref 13.0–17.0)
MCH: 29 pg (ref 26.0–34.0)
MCHC: 33.7 g/dL (ref 30.0–36.0)
MCV: 86.1 fL (ref 80.0–100.0)
Platelets: 105 10*3/uL — ABNORMAL LOW (ref 150–400)
RBC: 4.89 MIL/uL (ref 4.22–5.81)
RDW: 12.4 % (ref 11.5–15.5)
WBC: 7 10*3/uL (ref 4.0–10.5)
nRBC: 0 % (ref 0.0–0.2)

## 2019-02-03 LAB — HEPARIN LEVEL (UNFRACTIONATED): Heparin Unfractionated: 0.4 IU/mL (ref 0.30–0.70)

## 2019-02-03 SURGERY — LEFT HEART CATH AND CORS/GRAFTS ANGIOGRAPHY
Anesthesia: LOCAL

## 2019-02-03 MED ORDER — VERAPAMIL HCL 2.5 MG/ML IV SOLN
INTRAVENOUS | Status: DC | PRN
  Administered 2019-02-03: 10 mL via INTRA_ARTERIAL

## 2019-02-03 MED ORDER — MIDAZOLAM HCL 2 MG/2ML IJ SOLN
INTRAMUSCULAR | Status: AC
  Filled 2019-02-03: qty 2

## 2019-02-03 MED ORDER — HEPARIN (PORCINE) IN NACL 1000-0.9 UT/500ML-% IV SOLN
INTRAVENOUS | Status: AC
  Filled 2019-02-03: qty 1000

## 2019-02-03 MED ORDER — HEPARIN (PORCINE) IN NACL 1000-0.9 UT/500ML-% IV SOLN
INTRAVENOUS | Status: DC | PRN
  Administered 2019-02-03 (×3): 500 mL

## 2019-02-03 MED ORDER — SODIUM CHLORIDE 0.9% FLUSH
3.0000 mL | Freq: Two times a day (BID) | INTRAVENOUS | Status: DC
  Administered 2019-02-03 – 2019-02-04 (×3): 3 mL via INTRAVENOUS

## 2019-02-03 MED ORDER — SODIUM CHLORIDE 0.9% FLUSH: 3.0000 mL | INTRAVENOUS | Status: DC | PRN

## 2019-02-03 MED ORDER — NITROGLYCERIN 0.4 MG SL SUBL
SUBLINGUAL_TABLET | SUBLINGUAL | Status: AC
  Filled 2019-02-03: qty 1

## 2019-02-03 MED ORDER — LABETALOL HCL 5 MG/ML IV SOLN
10.0000 mg | INTRAVENOUS | Status: AC | PRN
  Filled 2019-02-03: qty 4

## 2019-02-03 MED ORDER — IOHEXOL 350 MG/ML SOLN
INTRAVENOUS | Status: DC | PRN
  Administered 2019-02-03: 45 mL via INTRACARDIAC

## 2019-02-03 MED ORDER — LIDOCAINE HCL (PF) 1 % IJ SOLN
INTRAMUSCULAR | Status: AC
  Filled 2019-02-03: qty 30

## 2019-02-03 MED ORDER — HEPARIN (PORCINE) IN NACL 1000-0.9 UT/500ML-% IV SOLN
INTRAVENOUS | Status: AC
  Filled 2019-02-03: qty 500

## 2019-02-03 MED ORDER — FENTANYL CITRATE (PF) 100 MCG/2ML IJ SOLN
INTRAMUSCULAR | Status: AC
  Filled 2019-02-03: qty 2

## 2019-02-03 MED ORDER — HYDRALAZINE HCL 20 MG/ML IJ SOLN
10.0000 mg | INTRAMUSCULAR | Status: AC | PRN
  Filled 2019-02-03: qty 1

## 2019-02-03 MED ORDER — SODIUM CHLORIDE 0.9 % IV SOLN
INTRAVENOUS | Status: AC
  Administered 2019-02-03: 600 mL via INTRAVENOUS

## 2019-02-03 MED ORDER — SODIUM CHLORIDE 0.9 % IV SOLN: 250.0000 mL | INTRAVENOUS | Status: DC | PRN

## 2019-02-03 MED ORDER — ENOXAPARIN SODIUM 40 MG/0.4ML ~~LOC~~ SOLN
40.0000 mg | SUBCUTANEOUS | Status: DC
  Administered 2019-02-03 – 2019-02-04 (×2): 40 mg via SUBCUTANEOUS
  Filled 2019-02-03 (×2): qty 0.4

## 2019-02-03 MED ORDER — FENTANYL CITRATE (PF) 100 MCG/2ML IJ SOLN
INTRAMUSCULAR | Status: DC | PRN
  Administered 2019-02-03: 25 ug via INTRAVENOUS

## 2019-02-03 MED ORDER — ISOSORBIDE MONONITRATE ER 60 MG PO TB24
120.0000 mg | ORAL_TABLET | Freq: Every day | ORAL | Status: DC
  Administered 2019-02-03 – 2019-02-04 (×2): 120 mg via ORAL
  Filled 2019-02-03: qty 2

## 2019-02-03 MED ORDER — VERAPAMIL HCL 2.5 MG/ML IV SOLN
INTRAVENOUS | Status: AC
  Filled 2019-02-03: qty 2

## 2019-02-03 MED ORDER — HEPARIN SODIUM (PORCINE) 1000 UNIT/ML IJ SOLN
INTRAMUSCULAR | Status: AC
  Filled 2019-02-03: qty 1

## 2019-02-03 MED ORDER — SODIUM CHLORIDE 0.9 % IV SOLN
INTRAVENOUS | Status: AC | PRN
  Administered 2019-02-03: 250 mL via INTRAVENOUS

## 2019-02-03 MED ORDER — MIDAZOLAM HCL 2 MG/2ML IJ SOLN
INTRAMUSCULAR | Status: DC | PRN
  Administered 2019-02-03: 1 mg via INTRAVENOUS

## 2019-02-03 MED ORDER — ISOSORBIDE MONONITRATE ER 30 MG PO TB24
30.0000 mg | ORAL_TABLET | Freq: Every day | ORAL | Status: DC
  Filled 2019-02-03 (×2): qty 1

## 2019-02-03 MED ORDER — HEPARIN SODIUM (PORCINE) 1000 UNIT/ML IJ SOLN
INTRAMUSCULAR | Status: DC | PRN
  Administered 2019-02-03: 3500 [IU] via INTRAVENOUS

## 2019-02-03 MED ORDER — NITROGLYCERIN 0.4 MG SL SUBL
0.4000 mg | SUBLINGUAL_TABLET | SUBLINGUAL | Status: DC | PRN
  Administered 2019-02-03 (×2): 0.4 mg via SUBLINGUAL

## 2019-02-03 MED ORDER — LIDOCAINE HCL (PF) 1 % IJ SOLN
INTRAMUSCULAR | Status: DC | PRN
  Administered 2019-02-03: 2 mL via INTRADERMAL

## 2019-02-03 SURGICAL SUPPLY — 12 items
CATH INFINITI 5 FR IM (CATHETERS) ×1 IMPLANT
CATH INFINITI 5FR MPB2 (CATHETERS) ×1 IMPLANT
CATH INFINITI 5FR MULTPACK ANG (CATHETERS) ×1 IMPLANT
COVER DOME SNAP 22 D (MISCELLANEOUS) ×3 IMPLANT
DEVICE RAD COMP TR BAND LRG (VASCULAR PRODUCTS) ×1 IMPLANT
GLIDESHEATH SLEND SS 6F .021 (SHEATH) ×1 IMPLANT
GUIDEWIRE INQWIRE 1.5J.035X260 (WIRE) IMPLANT
INQWIRE 1.5J .035X260CM (WIRE) ×2
KIT HEART LEFT (KITS) ×2 IMPLANT
PACK CARDIAC CATHETERIZATION (CUSTOM PROCEDURE TRAY) ×2 IMPLANT
TRANSDUCER W/STOPCOCK (MISCELLANEOUS) ×2 IMPLANT
TUBING CIL FLEX 10 FLL-RA (TUBING) ×2 IMPLANT

## 2019-02-03 NOTE — Progress Notes (Signed)
Progress Note  Patient Name: Keith Lucero. Date of Encounter: 02/03/2019  Primary Cardiologist:   Shirlee More, MD   Subjective   Unfortunately he continues to have severe [place].  Currently has taken two SLNTG since his cath and paine is down from 8 to 2.    Inpatient Medications    Scheduled Meds: . amLODipine  10 mg Oral QODAY  . aspirin EC  81 mg Oral Daily  . clopidogrel  75 mg Oral Q breakfast  . [START ON 02/04/2019] enoxaparin (LOVENOX) injection  40 mg Subcutaneous Q24H  . insulin aspart  0-5 Units Subcutaneous QHS  . insulin aspart  0-9 Units Subcutaneous TID WC  . isosorbide mononitrate  30 mg Oral Daily  . metoprolol tartrate  25 mg Oral BID  . nitroGLYCERIN      . simvastatin  20 mg Oral Daily  . sodium chloride flush  3 mL Intravenous Once  . sodium chloride flush  3 mL Intravenous Q12H   Continuous Infusions: . sodium chloride    . sodium chloride     PRN Meds: sodium chloride, acetaminophen, ALPRAZolam, hydrALAZINE, labetalol, nitroGLYCERIN, ondansetron (ZOFRAN) IV, sodium chloride flush, traMADol, zolpidem   Vital Signs    Vitals:   02/03/19 0816 02/03/19 0821 02/03/19 0826 02/03/19 0923  BP: 134/72 140/76 137/70 128/82  Pulse: 62 62 (!) 59 94  Resp: 13 12 12    Temp:      TempSrc:      SpO2: 100% 99% 100%   Weight:      Height:        Intake/Output Summary (Last 24 hours) at 02/03/2019 0941 Last data filed at 02/03/2019 0618 Gross per 24 hour  Intake 380 ml  Output 625 ml  Net -245 ml   Filed Weights   02/01/19 0643 02/02/19 0505 02/03/19 0500  Weight: 71.6 kg 71.2 kg 70.6 kg    Telemetry    NSR - Personally Reviewed  ECG    NA - Personally Reviewed  Physical Exam   GEN: No acute distress.   Neck: No  JVD Cardiac: RRR, no murmurs, rubs, or gallops.  Respiratory: Clear  to auscultation bilaterally. GI: Soft, nontender, non-distended  MS: No  edema; No deformity. Neuro:  Nonfocal  Psych: Normal affect   Labs     Chemistry Recent Labs  Lab 01/30/19 1448 01/30/19 2248  NA 141 138  K 4.9 4.1  CL 104 106  CO2 21 22  GLUCOSE 98 118*  BUN 17 21  CREATININE 0.92 0.99  CALCIUM 9.1 9.1  PROT 6.5  --   ALBUMIN 4.0  --   AST 35  --   ALT 25  --   ALKPHOS 87  --   BILITOT 1.2  --   GFRNONAA 81 >60  GFRAA 94 >60  ANIONGAP  --  10     Hematology Recent Labs  Lab 02/01/19 0233 02/02/19 0333 02/03/19 0453  WBC 6.1 6.3 7.0  RBC 4.68 4.74 4.89  HGB 13.8 14.0 14.2  HCT 40.4 40.7 42.1  MCV 86.3 85.9 86.1  MCH 29.5 29.5 29.0  MCHC 34.2 34.4 33.7  RDW 12.4 12.5 12.4  PLT 91* 101* 105*    Cardiac Enzymes Recent Labs  Lab 01/30/19 1450  TROPONINI 0.36*   No results for input(s): TROPIPOC in the last 168 hours.   BNPNo results for input(s): BNP, PROBNP in the last 168 hours.   DDimer No results for input(s): DDIMER in the last  168 hours.   Radiology    No results found.  Cardiac Studies   Cardiac cath     Patient Profile     73 y.o. male  with a history of CAD (s/p 5-vessel CABG in April 2020), diabetes mellitus, hypertension and hyperlipidemia who presented to the hospital with complaints of worsening chest pain.  Ruled in for NSTEMI.    Assessment & Plan    CAD:   Disease as above.  Medical management.  I reviewed the images for this note.   Severe native vessel and graft disease.  Imdur was added this admission and I increased this dose to max.  Might need to be back on IV NTG.  Likely will also need Ranexa.      HTN:    BP at target.   Med changes as above  DM:    A1c 6.0.  At target.  Continue current therapy.    For questions or updates, please contact [place] Please consult [email or web address] for contact info under Cardiology/STEMI.   Signed, Minus Breeding, MD  02/03/2019, 9:41 AM

## 2019-02-03 NOTE — Plan of Care (Signed)
  Problem: Clinical Measurements: Goal: Ability to maintain clinical measurements within normal limits will improve Outcome: Progressing   Problem: Clinical Measurements: Goal: Cardiovascular complication will be avoided Outcome: Progressing   Problem: Activity: Goal: Risk for activity intolerance will decrease Outcome: Progressing   Problem: Coping: Goal: Level of anxiety will decrease Outcome: Progressing

## 2019-02-03 NOTE — Telephone Encounter (Signed)
Attempted to call patient to inform him of Cancelled appointment- no answer, unable to leave a message.  Kirstie Peri

## 2019-02-03 NOTE — Progress Notes (Signed)
Pt had c/o chest pain post cardiac cath right after he arrived in his room. Ntg SL given x2, Suzzette Righter and Dr. Percival Spanish aware. Ntg SL ordered as per protocol.

## 2019-02-03 NOTE — Interval H&P Note (Signed)
History and Physical Interval Note:  02/03/2019 7:39 AM  Keith Lucero.  has presented today for cardiac catheterization, with the diagnosis of NSTEMI.  The various methods of treatment have been discussed with the patient and family. After consideration of risks, benefits and other options for treatment, the patient has consented to  Procedure(s): LEFT HEART CATH AND CORS/GRAFTS ANGIOGRAPHY (N/A) as a surgical intervention.  The patient's history has been reviewed, patient examined, no change in status, stable for surgery.  I have reviewed the patient's chart and labs.  Questions were answered to the patient's satisfaction.    Cath Lab Visit (complete for each Cath Lab visit)  Clinical Evaluation Leading to the Procedure:   ACS: Yes.    Non-ACS:  N/A  [name]

## 2019-02-04 ENCOUNTER — Encounter (HOSPITAL_COMMUNITY): Payer: Self-pay | Admitting: Cardiology

## 2019-02-04 LAB — CBC
HCT: 38.9 % — ABNORMAL LOW (ref 39.0–52.0)
Hemoglobin: 13.5 g/dL (ref 13.0–17.0)
MCH: 29.6 pg (ref 26.0–34.0)
MCHC: 34.7 g/dL (ref 30.0–36.0)
MCV: 85.3 fL (ref 80.0–100.0)
Platelets: 120 10*3/uL — ABNORMAL LOW (ref 150–400)
RBC: 4.56 MIL/uL (ref 4.22–5.81)
RDW: 12.6 % (ref 11.5–15.5)
WBC: 7.7 10*3/uL (ref 4.0–10.5)
nRBC: 0 % (ref 0.0–0.2)

## 2019-02-04 LAB — BASIC METABOLIC PANEL
Anion gap: 10 (ref 5–15)
BUN: 18 mg/dL (ref 8–23)
CO2: 22 mmol/L (ref 22–32)
Calcium: 8.8 mg/dL — ABNORMAL LOW (ref 8.9–10.3)
Chloride: 106 mmol/L (ref 98–111)
Creatinine, Ser: 0.93 mg/dL (ref 0.61–1.24)
GFR calc Af Amer: >60 mL/min (ref >60)
GFR calc non Af Amer: >60 mL/min (ref >60)
Glucose, Bld: 113 mg/dL — ABNORMAL HIGH (ref 70–99)
Potassium: 4.3 mmol/L (ref 3.5–5.1)
Sodium: 138 mmol/L (ref 135–145)

## 2019-02-04 LAB — GLUCOSE, CAPILLARY
Glucose-Capillary: 113 mg/dL — ABNORMAL HIGH (ref 70–99)
Glucose-Capillary: 187 mg/dL — ABNORMAL HIGH (ref 70–99)

## 2019-02-04 MED ORDER — ALPRAZOLAM 0.25 MG PO TABS: 0.2500 mg | ORAL_TABLET | Freq: Two times a day (BID) | ORAL | 0 refills | Status: DC | PRN

## 2019-02-04 MED ORDER — METOPROLOL TARTRATE 25 MG PO TABS: 25.0000 mg | ORAL_TABLET | Freq: Two times a day (BID) | ORAL | 3 refills | Status: DC

## 2019-02-04 MED ORDER — ISOSORBIDE MONONITRATE ER 120 MG PO TB24: 120.0000 mg | ORAL_TABLET | Freq: Every day | ORAL | 1 refills | Status: DC

## 2019-02-04 NOTE — Care Management Important Message (Signed)
Important Message  Patient Details  Name: Keith Lucero. MRN: [id number] Date of Birth: 1945-10-15   Medicare Important Message Given:  Yes     Shelda Altes 02/04/2019, 2:07 PM

## 2019-02-04 NOTE — Discharge Instructions (Signed)
Radial Site Care ° °This sheet gives you information about how to care for yourself after your procedure. Your health care provider may also give you more specific instructions. If you have problems or questions, contact your health care provider. °What can I expect after the procedure? °After the procedure, it is common to have: °· Bruising and tenderness at the catheter insertion area. °Follow these instructions at home: °Medicines °· Take over-the-counter and prescription medicines only as told by your health care provider. °Insertion site care °· Follow instructions from your health care provider about how to take care of your insertion site. Make sure you: °? Wash your hands with soap and water before you change your bandage (dressing). If soap and water are not available, use hand sanitizer. °? Change your dressing as told by your health care provider. °? Leave stitches (sutures), skin glue, or adhesive strips in place. These skin closures may need to stay in place for 2 weeks or longer. If adhesive strip edges start to loosen and curl up, you may trim the loose edges. Do not remove adhesive strips completely unless your health care provider tells you to do that. °· Check your insertion site every day for signs of infection. Check for: °? Redness, swelling, or pain. °? Fluid or blood. °? Pus or a bad smell. °? Warmth. °· Do not take baths, swim, or use a hot tub until your health care provider approves. °· You may shower 24-48 hours after the procedure, or as directed by your health care provider. °? Remove the dressing and gently wash the site with plain soap and water. °? Pat the area dry with a clean towel. °? Do not rub the site. That could cause bleeding. °· Do not apply powder or lotion to the site. °Activity ° °· For 24 hours after the procedure, or as directed by your health care provider: °? Do not flex or bend the affected arm. °? Do not push or pull heavy objects with the affected arm. °? Do not  drive yourself home from the hospital or clinic. You may drive 24 hours after the procedure unless your health care provider tells you not to. °? Do not operate machinery or power tools. °· Do not lift anything that is heavier than 10 lb (4.5 kg), or the limit that you are told, until your health care provider says that it is safe. °· Ask your health care provider when it is okay to: °? Return to work or school. °? Resume usual physical activities or sports. °? Resume sexual activity. °General instructions °· If the catheter site starts to bleed, raise your arm and put firm pressure on the site. If the bleeding does not stop, get help right away. This is a medical emergency. °· If you went home on the same day as your procedure, a responsible adult should be with you for the first 24 hours after you arrive home. °· Keep all follow-up visits as told by your health care provider. This is important. °Contact a health care provider if: °· You have a fever. °· You have redness, swelling, or yellow drainage around your insertion site. °Get help right away if: °· You have unusual pain at the radial site. °· The catheter insertion area swells very fast. °· The insertion area is bleeding, and the bleeding does not stop when you hold steady pressure on the area. °· Your arm or hand becomes pale, cool, tingly, or numb. °These symptoms may represent a serious problem   that is an emergency. Do not wait to see if the symptoms will go away. Get medical help right away. Call your local emergency services (911 in the [place] Do not drive yourself to the hospital. °Summary °· After the procedure, it is common to have bruising and tenderness at the site. °· Follow instructions from your health care provider about how to take care of your radial site wound. Check the wound every day for signs of infection. °· Do not lift anything that is heavier than 10 lb (4.5 kg), or the limit that you are told, until your health care provider says  that it is safe. °This information is not intended to replace advice given to you by your health care provider. Make sure you discuss any questions you have with your health care provider. °Document Released: 08/19/2010 Document Revised: 08/22/2017 Document Reviewed: 08/22/2017 °Elsevier Patient Education © 2020 [employer] ° °

## 2019-02-04 NOTE — Discharge Summary (Addendum)
Discharge Summary    Patient ID: Keith Lucero. MRN: [id number]; DOB: 06/02/1946  Admit date: 01/30/2019 Discharge date: 02/04/2019  Primary Care Provider: Patient, No Pcp Per  Primary Cardiologist: Shirlee More, MD  Primary Electrophysiologist:  None   Discharge Diagnoses    Principal Problem:   NSTEMI (non-ST elevated myocardial infarction) [facility]) Active Problems:   Essential hypertension   Type 2 diabetes mellitus ([place])   Hyperlipidemia LDL goal <70   Allergies Allergies  Allergen Reactions   Lipitor [Atorvastatin Calcium] Diarrhea   Penicillins Itching    Did it involve swelling of the face/tongue/throat, SOB, or low BP? Unknown Did it involve sudden or severe rash/hives, skin peeling, or any reaction on the inside of your mouth or nose? Unknown Did you need to seek medical attention at a hospital or doctor's office? Unknown When did it last happen?UNK If all above answers are NO, may proceed with cephalosporin use.     Diagnostic Studies/Procedures    LEFT HEART CATH AND CORS/GRAFTS ANGIOGRAPHY 02/03/2019   Conclusions: 1. Severe native coronary artery disease, as detailed below, which is similar to prior catheterization in April. 2. Patent LIMA-LAD, though apical LAD has severe stenosis just beyond the anastomosis. 3. Occlusion of all saphenous vein grafts, which does not appear to be acute. 4. Low left ventricular filling pressure (LVEDP 4 mmHg).  Recommendations: 1. Images have been reviewed with the interventional team.  There are no good targets for percutaneous intervention.  Medical therapy should be optimized.  I will add isosorbide mononitrate, which should be escalated as tolerated. 2. Continue dual antiplatelet therapy with aspirin and clopidogrel for at least 12 months, ideally longer. 3. Gentle post-cath hydration, given low LVEDP.  Nelva Bush, MD [facility]     _____________  Echocardiogram 01/31/2019 IMPRESSIONS   1. The left ventricle has normal systolic function with an ejection fraction of 60-65%. The cavity size was normal. There is moderately increased left ventricular wall thickness. Left ventricular diastolic Doppler parameters are consistent with impaired  relaxation. Indeterminate filling pressures The E/e' is 8-15. No evidence of left ventricular regional wall motion abnormalities.  2. The pericardial effusion is posterior to the left ventricle.  3. The mitral valve is abnormal. Mild thickening of the mitral valve leaflet. Mild calcification of the mitral valve leaflet.  4. The tricuspid valve is grossly normal.  5. The aortic valve is abnormal. Mild sclerosis of the aortic valve. Aortic valve regurgitation is trivial by color flow Doppler. No stenosis of the aortic valve.  6. Trivial pericardial effusion is present.  7. The inferior vena cava was normal in size with <50% respiratory variability.  SUMMARY   LVEF 60-65%, moderate LVH, normal wall motion, grade 1 DD, indeterminate LV filling pressure, low normal RV systolic function, aortic valve sclerosis with trivial AI, trivial posterior pericardial effusion, normal IVC  FINDINGS  Left Ventricle: The left ventricle has normal systolic function, with an ejection fraction of 60-65%. The cavity size was normal. There is moderately increased left ventricular wall thickness. Left ventricular diastolic Doppler parameters are consistent  with impaired relaxation. Indeterminate filling pressures The E/e' is 8-15. No evidence of left ventricular regional wall motion abnormalities..  Right Ventricle: The right ventricle has low normal systolic function. The cavity was normal. There is no increase in right ventricular wall thickness.  Left Atrium: Left atrial size was normal in size.  Right Atrium: Right atrial size was normal in size. Right atrial pressure is estimated at 10 mmHg.  Interatrial  Septum: No atrial level shunt detected by color flow  Doppler.  Pericardium: Trivial pericardial effusion is present. The pericardial effusion is posterior to the left ventricle.  Mitral Valve: The mitral valve is abnormal. Mild thickening of the mitral valve leaflet. Mild calcification of the mitral valve leaflet. Mitral valve regurgitation is trivial by color flow Doppler.  Tricuspid Valve: The tricuspid valve is grossly normal. Tricuspid valve regurgitation was not visualized by color flow Doppler.  Aortic Valve: The aortic valve is abnormal Mild sclerosis of the aortic valve. Aortic valve regurgitation is trivial by color flow Doppler. There is No stenosis of the aortic valve, with a calculated valve area of 2.36 cm.  Pulmonic Valve: The pulmonic valve was grossly normal. Pulmonic valve regurgitation is not visualized by color flow Doppler.  Venous: The inferior vena cava measures 1.72 cm, is normal in size with less than 50% respiratory variability.   History of Present Illness     [name]. is a 73 y.o. male with a history of CAD (s/p 5-vessel CABG in April 2020), diabetes mellitus, hypertension and hyperlipidemia who presented to the hospital with complaints of worsening chest pain. He has been having chest pain that radiates down the left arm. The symptoms have been worsening over the past 1 week. Most episodes occur at night. He states compliance with all of his medications (including the ASA and Plavix). He is not able to tolerate Atorvastatin and is on Simvastatin instead. He has struggled with labile blood pressures at home. He has had some measurements of SBP >180 mmHg. He also complains of exertional shortness of breath after walking only 40-50 feet. He was seen in the clinic by Dr. Bettina Gavia on 01/30/2019. A troponin was checked and was abnormal and therefore the patient was advised to come to the ED.   In the ED the high-sensitivity troponin was 140. The serum creatinine was 0.99. The hemoglobin was 13.6 with a hematocrit  of 41.2. The ECG documented sinus rhythm (rate 74 bpm), LAFB, poor R wave progression in the anterior leads and T wave changes in the lateral leads.  Hospital Course     Consultants: none  Mr. Ramroop was admitted and treated for NSTEMI with IV heparin, aspirin, Plavix, beta-blocker and statin.  High-sensitivity troponins were 140 and 127.  He was noted to have sinus node dysfunction but asymptomatic.  The patient was taken for left heart cath on 02/03/2019 which showed patent LIMA to LAD, though apical LAD has severe stenosis just beyond the anastomosis.  There was occlusion of all saphenous vein grafts which does not appear to be acute. There were no good targets for percutaneous intervention. Will work on [employer]. Imdur has been added. Plan to continue dual antiplatelet therapy with aspirin and clopidogrel for at least 12 months, ideally longer.   BP is low with med changes but no symptoms. A1c is 6.0, adequately controlled DM.   Patient has been seen by Dr. Percival Spanish today and deemed ready for discharge home. All follow up appointments have been scheduled. Discharge medications are listed below. _____________  Discharge Vitals Blood pressure 105/71, pulse 68, temperature 98 F (36.7 C), temperature source Oral, resp. rate 18, height 5\' 9"  (1.753 m), weight 71.4 kg, SpO2 99 %.  Filed Weights   02/02/19 0505 02/03/19 0500 02/04/19 0402  Weight: 71.2 kg 70.6 kg 71.4 kg    Labs & Radiologic Studies    CBC Recent Labs    02/03/19 0453 02/04/19 0359  WBC 7.0 7.7  HGB 14.2 13.5  HCT 42.1 38.9*  MCV 86.1 85.3  PLT 105* 564*   Basic Metabolic Panel Recent Labs    02/04/19 0359  NA 138  K 4.3  CL 106  CO2 22  GLUCOSE 113*  BUN 18  CREATININE 0.93  CALCIUM 8.8*   Liver Function Tests No results for input(s): AST, ALT, ALKPHOS, BILITOT, PROT, ALBUMIN in the last 72 hours. No results for input(s): LIPASE, AMYLASE in the last 72 hours. Cardiac Enzymes No  results for input(s): CKTOTAL, CKMB, CKMBINDEX, TROPONINI in the last 72 hours. BNP Invalid input(s): POCBNP D-Dimer No results for input(s): DDIMER in the last 72 hours. Hemoglobin A1C No results for input(s): HGBA1C in the last 72 hours. Fasting Lipid Panel No results for input(s): CHOL, HDL, LDLCALC, TRIG, CHOLHDL, LDLDIRECT in the last 72 hours. Thyroid Function Tests No results for input(s): TSH, T4TOTAL, T3FREE, THYROIDAB in the last 72 hours.  Invalid input(s): FREET3 _____________  Dg Chest 2 View  Result Date: 01/30/2019 CLINICAL DATA:  Chest pain, shortness of breath EXAM: CHEST - 2 VIEW COMPARISON:  12/24/2018 FINDINGS: Heart and mediastinal contours are within normal limits. No focal opacities or effusions. No acute bony abnormality. Prior CABG. IMPRESSION: No active cardiopulmonary disease. Electronically Signed   By: Rolm Baptise M.D.   On: 01/30/2019 23:30   Disposition   Pt is being discharged home today in good condition.  Follow-up Plans & Appointments    Follow-up Information    Richardo Priest, MD Follow up.   Specialty: Cardiology Why: Cardiology hospital follow up on 02/14/2019 at 2:15.  Please arrive 15 minutes early for check in.  Contact information: [place] [address] [phone]          Discharge Instructions    Diet - low sodium heart healthy   Complete by: As directed    Discharge instructions   Complete by: As directed    PLEASE REMEMBER TO BRING ALL OF YOUR MEDICATIONS TO EACH OF YOUR FOLLOW-UP OFFICE VISITS.  PLEASE ATTEND ALL SCHEDULED FOLLOW-UP APPOINTMENTS.   Activity: Increase activity slowly as tolerated. You may shower, but no soaking baths (or swimming) for 1 week. No driving for 1 week. No lifting over 10 lbs for 2 weeks. No sexual activity for 2 weeks.   You May Return to Work: in 1 week (if applicable)  Wound Care: You may wash cath site gently with soap and water. Keep cath site clean and dry. If you notice  pain, swelling, bleeding or pus at your cath site, please call [phone].   Increase activity slowly   Complete by: As directed       Discharge Medications   Allergies as of 02/04/2019      Reactions   Lipitor [atorvastatin Calcium] Diarrhea   Penicillins Itching   Did it involve swelling of the face/tongue/throat, SOB, or low BP? Unknown Did it involve sudden or severe rash/hives, skin peeling, or any reaction on the inside of your mouth or nose? Unknown Did you need to seek medical attention at a hospital or doctor's office? Unknown When did it last happen?UNK If all above answers are NO, may proceed with cephalosporin use.      Medication List    STOP taking these medications   cloNIDine 0.1 MG tablet Commonly known as: CATAPRES   hydrochlorothiazide 12.5 MG capsule Commonly known as: MICROZIDE   lisinopril 40 MG tablet Commonly known as: ZESTRIL     TAKE  these medications   ALPRAZolam 0.25 MG tablet Commonly known as: XANAX Take 1 tablet (0.25 mg total) by mouth 2 (two) times daily as needed for anxiety.   amLODipine 10 MG tablet Commonly known as: NORVASC Take 1 tablet (10 mg total) by mouth every other day. Alternate with HZTZ   aspirin EC 81 MG tablet Take 81 mg by mouth daily.   clopidogrel 75 MG tablet Commonly known as: Plavix Take 1 tablet (75 mg total) by mouth daily.   FeroSul 325 (65 FE) MG tablet Generic drug: ferrous sulfate Take 325 mg by mouth at bedtime.   isosorbide mononitrate 120 MG 24 hr tablet Commonly known as: IMDUR Take 1 tablet (120 mg total) by mouth daily. Start taking on: February 05, 2019   metFORMIN 750 MG 24 hr tablet Commonly known as: GLUCOPHAGE-XR Take 750 mg by mouth at bedtime. Notes to patient: Hold metformin until 02/06/2019   metoprolol tartrate 25 MG tablet Commonly known as: LOPRESSOR Take 1 tablet (25 mg total) by mouth 2 (two) times daily. What changed: when to take this   nitroGLYCERIN 0.4 MG SL  tablet Commonly known as: NITROSTAT Place 1 tablet (0.4 mg total) under the tongue every 5 (five) minutes as needed for chest pain.   omeprazole 20 MG capsule Commonly known as: PRILOSEC Take 20 mg by mouth every morning.   simvastatin 20 MG tablet Commonly known as: Zocor Take 1 tablet (20 mg total) by mouth daily. What changed: when to take this   traMADol 50 MG tablet Commonly known as: ULTRAM Take 1 tablet (50 mg total) by mouth every 6 (six) hours as needed for moderate pain.        Acute coronary syndrome (MI, NSTEMI, STEMI, etc) this admission?: Yes.     AHA/ACC Clinical Performance & Quality Measures: 1. Aspirin prescribed? - Yes 2. ADP Receptor Inhibitor (Plavix/Clopidogrel, Brilinta/Ticagrelor or Effient/Prasugrel) prescribed (includes medically managed patients)? - Yes 3. Beta Blocker prescribed? - Yes 4. High Intensity Statin (Lipitor 40-80mg  or Crestor 20-40mg ) prescribed? - No - unable to tolerated atorvastatin, on simvastatin.  5. EF assessed during THIS hospitalization? - Yes 6. For EF <40%, was ACEI/ARB prescribed? - Not Applicable (EF >/= 19%) 7. For EF <40%, Aldosterone Antagonist (Spironolactone or Eplerenone) prescribed? - Not Applicable (EF >/= 37%) 8. Cardiac Rehab Phase II ordered (Included Medically managed Patients)? - No - none     Outstanding Labs/Studies   none  Duration of Discharge Encounter   Greater than 30 minutes including physician time.  Signed, Daune Perch, NP 02/04/2019, 1:04 PM  Patient seen and examined.  Plan as discussed in my rounding note for today and outlined above. Minus Breeding  02/04/2019  5:10 PM

## 2019-02-04 NOTE — Progress Notes (Signed)
Progress Note  Patient Name: Keith Lucero. Date of Encounter: 02/04/2019  Primary Cardiologist:   Shirlee More, MD   Subjective   No further chest pain since yesterday morning.  Breathing OK.    Inpatient Medications    Scheduled Meds: . amLODipine  10 mg Oral QODAY  . aspirin EC  81 mg Oral Daily  . clopidogrel  75 mg Oral Q breakfast  . enoxaparin (LOVENOX) injection  40 mg Subcutaneous Q24H  . insulin aspart  0-5 Units Subcutaneous QHS  . insulin aspart  0-9 Units Subcutaneous TID WC  . isosorbide mononitrate  120 mg Oral Daily  . metoprolol tartrate  25 mg Oral BID  . simvastatin  20 mg Oral Daily  . sodium chloride flush  3 mL Intravenous Once  . sodium chloride flush  3 mL Intravenous Q12H   Continuous Infusions: . sodium chloride     PRN Meds: sodium chloride, acetaminophen, ALPRAZolam, nitroGLYCERIN, ondansetron (ZOFRAN) IV, sodium chloride flush, traMADol, zolpidem   Vital Signs    Vitals:   02/03/19 0923 02/03/19 2007 02/03/19 2139 02/04/19 0402  BP: 128/82 115/66  105/71  Pulse: 94 71 69 68  Resp:  15  18  Temp:  98.4 F (36.9 C)  98 F (36.7 C)  TempSrc:  Oral  Oral  SpO2:  99%  99%  Weight:    71.4 kg  Height:        Intake/Output Summary (Last 24 hours) at 02/04/2019 0907 Last data filed at 02/03/2019 2139 Gross per 24 hour  Intake 483 ml  Output 175 ml  Net 308 ml   Filed Weights   02/02/19 0505 02/03/19 0500 02/04/19 0402  Weight: 71.2 kg 70.6 kg 71.4 kg    Telemetry    NSR - Personally Reviewed  ECG    N - Personally Reviewed  Physical Exam   GEN: No  acute distress.   Neck: No  JVD Cardiac: RRR, no murmurs, rubs, or gallops.  Respiratory: Clear   to auscultation bilaterally. GI: Soft, nontender, non-distended, normal bowel sounds  MS:  No edema; No deformity.  Cath site is OK without bleeding or bruising Neuro:   Nonfocal  Psych: Oriented and appropriate    Labs    Chemistry Recent Labs  Lab 01/30/19 1448  01/30/19 2248 02/04/19 0359  NA 141 138 138  K 4.9 4.1 4.3  CL 104 106 106  CO2 21 22 22   GLUCOSE 98 118* 113*  BUN 17 21 18   CREATININE 0.92 0.99 0.93  CALCIUM 9.1 9.1 8.8*  PROT 6.5  --   --   ALBUMIN 4.0  --   --   AST 35  --   --   ALT 25  --   --   ALKPHOS 87  --   --   BILITOT 1.2  --   --   GFRNONAA 81 >60 >60  GFRAA 94 >60 >60  ANIONGAP  --  10 10     Hematology Recent Labs  Lab 02/02/19 0333 02/03/19 0453 02/04/19 0359  WBC 6.3 7.0 7.7  RBC 4.74 4.89 4.56  HGB 14.0 14.2 13.5  HCT 40.7 42.1 38.9*  MCV 85.9 86.1 85.3  MCH 29.5 29.0 29.6  MCHC 34.4 33.7 34.7  RDW 12.5 12.4 12.6  PLT 101* 105* 120*    Cardiac Enzymes Recent Labs  Lab 01/30/19 1450  TROPONINI 0.36*   No results for input(s): TROPIPOC in the last 168 hours.  BNPNo results for input(s): BNP, PROBNP in the last 168 hours.   DDimer No results for input(s): DDIMER in the last 168 hours.   Radiology    No results found.  Cardiac Studies   Cardiac cath     Patient Profile     73 y.o. male  with a history of CAD (s/p 5-vessel CABG in April 2020), diabetes mellitus, hypertension and hyperlipidemia who presented to the hospital with complaints of worsening chest pain.  Ruled in for NSTEMI.    Assessment & Plan    CAD:   He now is pain free.  Imdur increased.  OK to discharge on meds as listed.  Follow up in [facility] clinic next week.    HTN:   BP is low with med changes but no symptoms.  Continue current therapy.    DM:    A1c 6.0.  Continue current therapy.    For questions or updates, please contact [place] Please consult [email or web address] for contact info under Cardiology/STEMI.   Signed, Minus Breeding, MD  02/04/2019, 9:07 AM

## 2019-02-04 NOTE — Consult Note (Signed)
   [facility] CM Inpatient Consult   02/04/2019  Youcef Klas [birth year] [id number]    Patient checked for potential need of [facility] care management services for 15% medium risk score for unplanned readmission and hospitalizations. She has [employer] plan.  Chart reviewed and MD History and Physical on 01/31/19 states as follows : KABIR BRANNOCK Jr.is a 73 y.o.malewith a history of CAD (s/p 5-vessel CABG in April 2020), diabetes mellitus, hypertension and hyperlipidemia  who presented to the hospital with complaints of worsening chest pain. He has been having chest pain that radiates down the left arm.He was admitted and treated for NSTEMI with IV heparin, aspirin, Plavix, beta-blocker and statin.    Patient's primary care provider is Dr. Nelda Bucks with [facility], listed to provide transition of care.  Patient will be followed by an external care management group ([facility]) post hospitalization to follow-up needs and for continued case management services.   Of note, [facility] Care Management services does not replace or interfere with any services that are needed or arranged by inpatient transition of care case management or social work.   For additional questions or referrals, please contact:  Edwena Felty A. Dlisa Barnwell, BSN, RN-BC [facility] Liaison Cell: [phone]

## 2019-02-04 NOTE — Plan of Care (Signed)
  Problem: Clinical Measurements: Goal: Ability to maintain clinical measurements within normal limits will improve Outcome: Progressing   Problem: Pain Managment: Goal: General experience of comfort will improve Outcome: Progressing   Problem: Education: Goal: Knowledge of General Education information will improve Description: Including pain rating scale, medication(s)/side effects and non-pharmacologic comfort measures Outcome: Adequate for Discharge   Problem: Nutrition: Goal: Adequate nutrition will be maintained Outcome: Adequate for Discharge

## 2019-02-07 ENCOUNTER — Encounter (HOSPITAL_COMMUNITY): Payer: PPO

## 2019-02-12 ENCOUNTER — Other Ambulatory Visit: Payer: Self-pay

## 2019-02-12 ENCOUNTER — Ambulatory Visit (INDEPENDENT_AMBULATORY_CARE_PROVIDER_SITE_OTHER): Payer: PPO | Admitting: Cardiology

## 2019-02-12 ENCOUNTER — Other Ambulatory Visit: Payer: Self-pay | Admitting: Cardiology

## 2019-02-12 VITALS — BP 112/60 | HR 55 | Temp 99.9°F | Ht 69.0 in | Wt 163.2 lb

## 2019-02-12 DIAGNOSIS — E782 Mixed hyperlipidemia: Secondary | ICD-10-CM | POA: Diagnosis not present

## 2019-02-12 DIAGNOSIS — R079 Chest pain, unspecified: Secondary | ICD-10-CM

## 2019-02-12 DIAGNOSIS — I25118 Atherosclerotic heart disease of native coronary artery with other forms of angina pectoris: Secondary | ICD-10-CM

## 2019-02-12 DIAGNOSIS — I10 Essential (primary) hypertension: Secondary | ICD-10-CM

## 2019-02-12 DIAGNOSIS — Z951 Presence of aortocoronary bypass graft: Secondary | ICD-10-CM

## 2019-02-12 HISTORY — DX: Chest pain, unspecified: R07.9

## 2019-02-12 MED ORDER — CLONIDINE HCL 0.1 MG PO TABS: 0.1000 mg | ORAL_TABLET | Freq: Every day | ORAL | 0 refills | Status: DC

## 2019-02-12 MED ORDER — RANOLAZINE ER 500 MG PO TB12: 500.0000 mg | ORAL_TABLET | Freq: Two times a day (BID) | ORAL | 0 refills | Status: DC

## 2019-02-12 NOTE — Progress Notes (Signed)
Cardiology Office Note:    Date:  02/12/2019   ID:  [name], DOB 07-22-1946, MRN [id number]  PCP:  Patient, No Pcp Per  Cardiologist:  Shirlee More, MD    Referring MD: No ref. provider found    ASSESSMENT:    1. Coronary artery disease of native artery of native heart with stable angina pectoris ([place])   2. S/P CABG x 5   3. Mixed hyperlipidemia   4. Essential hypertension    PLAN:    In order of problems listed above:  1. CAD - NSTEMI 11/19/18, CABG x5 11/21/18, cath 01/30/19 for chest pain. Occlusion of all SG graft. Started on Imdur 120 mg.  He is having typical exertional angina New York Heart Association class II to class III maximize medical therapy by adding ranolazine and if he fails medical treatment consider transmyocardial laser revascularization for palliation. 2. S/p CABG x5 -  3. HLD - Continue Simvastatin 20mg .  4. HTN -poorly controlled evening blood pressures are severely elevated his daughter has good healthcare literacy and checks them at home and will add clonidine that he is taken in the past PRN daily at bedtime and continue his other antihypertensive agents.   Next appointment: 2 weeks to uptitrate ranolazine   Medication Adjustments/Labs and Tests Ordered: Current medicines are reviewed at length with the patient today.  Concerns regarding medicines are outlined above.  No orders of the defined types were placed in this encounter.  No orders of the defined types were placed in this encounter.   Chief Complaint  Patient presents with   Follow-up    for   Coronary Artery Disease   Hypertension   Hyperlipidemia    History of Present Illness:    Keith Lucero. is a 73 y.o. male with a hx of CAD s/p NSTEMI 11/19/18, CABG x5 11/21/18, repeat cardiac cath 02/03/19 with occluded grafts. His beta blocker, ACE, thiazide diuretic, and Amlodipine were previously reduced for complaints of fatigue and hypotension. He was last seen 01/30/19 as  a work in for episode of chest pain associated with labile BP and diaphoresis waking him from sleep. He was sent to the ED and admitted 01/30/19 for elevated troponin and chest pain. Cardiac cath was completed 02/03/19 by Dr. Saunders Revel. He had patent PIMA-LAD with severe stenosis just beyond anastomosis and occlusion of all SVG. He was started in Imdur and DAPT. Echo 01/31/19 with Ef 60-65% and impaired diastolic function.   Compliance with diet, lifestyle and medications: Yes  Unfortunately he is not doing well he continues to have angina at times several times a day when he is walking on an incline or lifting.  He is very discouraged by the quality of his life and is also having blood pressure in the evenings above 503 systolic.  We discussed maximizing medical treatment if he fails considering transmyocardial laser revascularization for palliation.  To achieve blood pressure goals will add clonidine at bedtime continue his beta-blocker heart rate in the mid 50s continue his calcium channel blocker oral nitrates with a blood pressure 888 systolic and add ranolazine.  His previous EKG had QT prolongation we will recheck today I will see him back in 2 weeks to uptitrate I tried to give him a great deal of reassurance and lift his spirits today.  No edema shortness of breath orthopnea palpitation or syncope.  During the visit I reviewed the results of his heart catheterization with him he has made a very direct  blunt question what his outcome would be and what other treatments are available we discussed the mechanisms of action treatment with beta-blocker oral nitrates calcium channel blocker the benefits of ranolazine and man with typical exertional angina and the potential for other interventions such as TM LR. 25 minutes spent face to face  Past Medical History:  Diagnosis Date   CAD (coronary artery disease)    s/p CABGX5 10/2018. LHC 02/03/2019: patent LIMA, all SVG occluded, no target for PCI.    Diabetes  mellitus without complication ([place])    type 2   Hypertension     Past Surgical History:  Procedure Laterality Date   APPENDECTOMY     BACK SURGERY     CHOLECYSTECTOMY     CORONARY ANGIOPLASTY WITH STENT PLACEMENT  1998   CORONARY ARTERY BYPASS GRAFT N/A 11/21/2018   Procedure: CORONARY ARTERY BYPASS GRAFTING (CABG) TIMES FIVE USING LEFT INTERNAL MAMMARY ARTERY AND LEFT SAPHENOUS VEIN HARVESTED ENDOSCOPICALLY;  Surgeon: Gaye Pollack, MD;  Location: [facility];  Service: Open Heart Surgery;  Laterality: N/A;   LEFT HEART CATH AND CORONARY ANGIOGRAPHY N/A 11/19/2018   Procedure: LEFT HEART CATH AND CORONARY ANGIOGRAPHY;  Surgeon: Troy Sine, MD;  Location: [place] [facility];  Service: Cardiovascular;  Laterality: N/A;   LEFT HEART CATH AND CORS/GRAFTS ANGIOGRAPHY N/A 02/03/2019   Procedure: LEFT HEART CATH AND CORS/GRAFTS ANGIOGRAPHY;  Surgeon: Nelva Bush, MD;  Location: [facility];  Service: Cardiovascular;  Laterality: N/A;   TEE WITHOUT CARDIOVERSION N/A 11/21/2018   Procedure: TRANSESOPHAGEAL ECHOCARDIOGRAM (TEE);  Surgeon: Gaye Pollack, MD;  Location: [facility];  Service: Open Heart Surgery;  Laterality: N/A;    Current Medications: Current Meds  Medication Sig   ALPRAZolam (XANAX) 0.25 MG tablet Take 1 tablet (0.25 mg total) by mouth 2 (two) times daily as needed for anxiety.   amLODipine (NORVASC) 10 MG tablet Take 1 tablet (10 mg total) by mouth every other day. Alternate with HZTZ   aspirin EC 81 MG tablet Take 81 mg by mouth daily.   clopidogrel (PLAVIX) 75 MG tablet Take 1 tablet (75 mg total) by mouth daily.   ferrous sulfate (FEROSUL) 325 (65 FE) MG tablet Take 325 mg by mouth at bedtime.   isosorbide mononitrate (IMDUR) 120 MG 24 hr tablet Take 1 tablet (120 mg total) by mouth daily.   metFORMIN (GLUCOPHAGE-XR) 750 MG 24 hr tablet Take 750 mg by mouth at bedtime.   metoprolol tartrate (LOPRESSOR) 25 MG tablet Take 1 tablet (25 mg total) by mouth  2 (two) times daily.   nitroGLYCERIN (NITROSTAT) 0.4 MG SL tablet Place 1 tablet (0.4 mg total) under the tongue every 5 (five) minutes as needed for chest pain.   omeprazole (PRILOSEC) 20 MG capsule Take 20 mg by mouth every morning.   simvastatin (ZOCOR) 20 MG tablet Take 1 tablet (20 mg total) by mouth daily. (Patient taking differently: Take 20 mg by mouth daily at 6 PM. )   traMADol (ULTRAM) 50 MG tablet Take 1 tablet (50 mg total) by mouth every 6 (six) hours as needed for moderate pain.     Allergies:   Lipitor [atorvastatin calcium] and Penicillins   Social History   Socioeconomic History   Marital status: Married    Spouse name: Not on file   Number of children: Not on file   Years of education: Not on file   Highest education level: Not on file  Occupational History   Not on  file  Social Needs   Financial resource strain: Not on file   Food insecurity    Worry: Not on file    Inability: Not on file   Transportation needs    Medical: Not on file    Non-medical: Not on file  Tobacco Use   Smoking status: Never Smoker   Smokeless tobacco: Former Systems developer    Types: Chew  Substance and Sexual Activity   Alcohol use: Not Currently   Drug use: Never   Sexual activity: Not Currently  Lifestyle   Physical activity    Days per week: Not on file    Minutes per session: Not on file   Stress: Not on file  Relationships   Social connections    Talks on phone: Not on file    Gets together: Not on file    Attends religious service: Not on file    Active member of club or organization: Not on file    Attends meetings of clubs or organizations: Not on file    Relationship status: Not on file  Other Topics Concern   Not on file  Social History Narrative   Not on file     Family History: The patient's family history includes Cancer in his sister; Diabetes in his sister; Heart attack in his mother and sister; Stroke in his mother and sister. ROS:     Please see the history of present illness.    All other systems reviewed and are negative.  EKGs/Labs/Other Studies Reviewed:    The following studies were reviewed today:  Cardiac Cath 02/03/19 Conclusions: 1. Severe native coronary artery disease, as detailed below, which is similar to prior catheterization in April. 2. Patent LIMA-LAD, though apical LAD has severe stenosis just beyond the anastomosis. 3. Occlusion of all saphenous vein grafts, which does not appear to be acute. 4. Low left ventricular filling pressure (LVEDP 4 mmHg).  Echo 01/31/19  1. The left ventricle has normal systolic function with an ejection fraction of 60-65%. The cavity size was normal. There is moderately increased left ventricular wall thickness. Left ventricular diastolic Doppler parameters are consistent with impaired  relaxation. Indeterminate filling pressures The E/e' is 8-15. No evidence of left ventricular regional wall motion abnormalities.  2. The pericardial effusion is posterior to the left ventricle.  3. The mitral valve is abnormal. Mild thickening of the mitral valve leaflet. Mild calcification of the mitral valve leaflet.  4. The tricuspid valve is grossly normal.  5. The aortic valve is abnormal. Mild sclerosis of the aortic valve. Aortic valve regurgitation is trivial by color flow Doppler. No stenosis of the aortic valve.  6. Trivial pericardial effusion is present.  7. The inferior vena cava was normal in size with <50% respiratory variability.  Recommendations: 1. Images have been reviewed with the interventional team.  There are no good targets for percutaneous intervention.  Medical therapy should be optimized.  I will add isosorbide mononitrate, which should be escalated as tolerated. 2. Continue dual antiplatelet therapy with aspirin and clopidogrel for at least 12 months, ideally longer. 3. Gentle post-cath hydration, given low LVEDP.    EKG:  EKG ordered today and personally  reviewed.  The ekg ordered today demonstrates  Sinus bradycardia 53 bpm he has continued ischemic T wave inversion but markedly improved from his baseline EKG his QT interval is normal Recent Labs: 11/22/2018: Magnesium 2.6 01/30/2019: ALT 25 02/04/2019: BUN 18; Creatinine, Ser 0.93; Hemoglobin 13.5; Platelets 120; Potassium 4.3; Sodium 138  Recent Lipid Panel    Component Value Date/Time   CHOL 88 01/31/2019 0637   CHOL 93 (L) 01/30/2019 1448   TRIG 139 01/31/2019 0637   HDL 26 (L) 01/31/2019 0637   HDL 30 (L) 01/30/2019 1448   CHOLHDL 3.4 01/31/2019 0637   VLDL 28 01/31/2019 0637   LDLCALC 34 01/31/2019 0637   LDLCALC 35 01/30/2019 1448    Physical Exam:    VS:  BP 112/60 (BP Location: Right Arm, Patient Position: Sitting, Cuff Size: Normal)    Pulse (!) 55    Temp 99.9 F (37.7 C)    Ht 5\' 9"  (1.753 m)    Wt 163 lb 3.2 oz (74 kg)    SpO2 97%    BMI 24.10 kg/m     Wt Readings from Last 3 Encounters:  02/12/19 163 lb 3.2 oz (74 kg)  02/04/19 157 lb 6.4 oz (71.4 kg)  01/30/19 162 lb 1.9 oz (73.5 kg)     GEN:  Well nourished, well developed in no acute distress HEENT: Normal NECK: No JVD; No carotid bruits LYMPHATICS: No lymphadenopathy CARDIAC: RRR, no murmurs, rubs, gallops RESPIRATORY:  Clear to auscultation without rales, wheezing or rhonchi  ABDOMEN: Soft, non-tender, non-distended MUSCULOSKELETAL:  No edema; No deformity  SKIN: Warm and dry NEUROLOGIC:  Alert and oriented x 3 PSYCHIATRIC:  Normal affect    Signed, Shirlee More, MD  02/12/2019 11:26 AM    [place]

## 2019-02-12 NOTE — Patient Instructions (Signed)
Medication Instructions:  Your physician has recommended you make the following change in your medication:   START: Clonidine 0.1 mg: Take 1 tab daily at bedtime START: Ranolazine (Ranexa) 500 mg : Take 1 tab twice daily  If you need a refill on your cardiac medications before your next appointment, please call your pharmacy.   Lab work: None If you have labs (blood work) drawn today and your tests are completely normal, you will receive your results only by: Marland Kitchen MyChart Message (if you have MyChart) OR . A paper copy in the mail If you have any lab test that is abnormal or we need to change your treatment, we will call you to review the results.  Testing/Procedures: You had an EKG today  Follow-Up: At [facility], you and your health needs are our priority.  As part of our continuing mission to provide you with exceptional heart care, we have created designated Provider Care Teams.  These Care Teams include your primary Cardiologist (physician) and Advanced Practice Providers (APPs -  Physician Assistants and Nurse Practitioners) who all work together to provide you with the care you need, when you need it. You will need a follow up appointment in 2 weeks.   Any Other Special Instructions Will Be Listed Below (If Applicable).

## 2019-02-14 ENCOUNTER — Ambulatory Visit: Payer: PPO | Admitting: Cardiology

## 2019-02-24 DIAGNOSIS — E782 Mixed hyperlipidemia: Secondary | ICD-10-CM | POA: Diagnosis not present

## 2019-02-24 DIAGNOSIS — I10 Essential (primary) hypertension: Secondary | ICD-10-CM | POA: Diagnosis not present

## 2019-02-24 DIAGNOSIS — E1165 Type 2 diabetes mellitus with hyperglycemia: Secondary | ICD-10-CM | POA: Diagnosis not present

## 2019-02-24 DIAGNOSIS — Z79899 Other long term (current) drug therapy: Secondary | ICD-10-CM | POA: Diagnosis not present

## 2019-02-24 DIAGNOSIS — E559 Vitamin D deficiency, unspecified: Secondary | ICD-10-CM | POA: Diagnosis not present

## 2019-02-24 DIAGNOSIS — R0989 Other specified symptoms and signs involving the circulatory and respiratory systems: Secondary | ICD-10-CM | POA: Diagnosis not present

## 2019-02-24 DIAGNOSIS — Z0000 Encounter for general adult medical examination without abnormal findings: Secondary | ICD-10-CM | POA: Diagnosis not present

## 2019-02-24 DIAGNOSIS — R590 Localized enlarged lymph nodes: Secondary | ICD-10-CM | POA: Diagnosis not present

## 2019-02-24 DIAGNOSIS — D518 Other vitamin B12 deficiency anemias: Secondary | ICD-10-CM | POA: Diagnosis not present

## 2019-02-24 DIAGNOSIS — E038 Other specified hypothyroidism: Secondary | ICD-10-CM | POA: Diagnosis not present

## 2019-02-26 DIAGNOSIS — I714 Abdominal aortic aneurysm, without rupture: Secondary | ICD-10-CM | POA: Diagnosis not present

## 2019-02-26 DIAGNOSIS — R221 Localized swelling, mass and lump, neck: Secondary | ICD-10-CM | POA: Diagnosis not present

## 2019-02-26 DIAGNOSIS — D492 Neoplasm of unspecified behavior of bone, soft tissue, and skin: Secondary | ICD-10-CM | POA: Diagnosis not present

## 2019-03-03 ENCOUNTER — Encounter: Payer: Self-pay | Admitting: Cardiology

## 2019-03-03 ENCOUNTER — Other Ambulatory Visit: Payer: Self-pay

## 2019-03-03 ENCOUNTER — Ambulatory Visit (INDEPENDENT_AMBULATORY_CARE_PROVIDER_SITE_OTHER): Payer: PPO | Admitting: Cardiology

## 2019-03-03 VITALS — BP 150/66 | HR 51 | Ht 69.0 in | Wt 165.0 lb

## 2019-03-03 DIAGNOSIS — I10 Essential (primary) hypertension: Secondary | ICD-10-CM | POA: Diagnosis not present

## 2019-03-03 DIAGNOSIS — I25118 Atherosclerotic heart disease of native coronary artery with other forms of angina pectoris: Secondary | ICD-10-CM | POA: Diagnosis not present

## 2019-03-03 DIAGNOSIS — Z951 Presence of aortocoronary bypass graft: Secondary | ICD-10-CM

## 2019-03-03 DIAGNOSIS — E782 Mixed hyperlipidemia: Secondary | ICD-10-CM

## 2019-03-03 DIAGNOSIS — I444 Left anterior fascicular block: Secondary | ICD-10-CM

## 2019-03-03 MED ORDER — RANOLAZINE ER 1000 MG PO TB12: 1000.0000 mg | ORAL_TABLET | Freq: Two times a day (BID) | ORAL | 1 refills | Status: DC

## 2019-03-03 MED ORDER — ISOSORBIDE MONONITRATE ER 120 MG PO TB24: 120.0000 mg | ORAL_TABLET | Freq: Every day | ORAL | 1 refills | Status: DC

## 2019-03-03 MED ORDER — CLOPIDOGREL BISULFATE 75 MG PO TABS: 75.0000 mg | ORAL_TABLET | Freq: Every day | ORAL | 1 refills | Status: AC

## 2019-03-03 MED ORDER — METOPROLOL TARTRATE 25 MG PO TABS: 25.0000 mg | ORAL_TABLET | Freq: Two times a day (BID) | ORAL | 1 refills | Status: DC

## 2019-03-03 MED ORDER — CLONIDINE HCL 0.2 MG PO TABS: 0.2000 mg | ORAL_TABLET | Freq: Every day | ORAL | 1 refills | Status: DC

## 2019-03-03 MED ORDER — AMLODIPINE BESYLATE 10 MG PO TABS: 10.0000 mg | ORAL_TABLET | ORAL | 2 refills | Status: DC

## 2019-03-03 MED ORDER — SIMVASTATIN 20 MG PO TABS: 20.0000 mg | ORAL_TABLET | Freq: Every day | ORAL | 1 refills | Status: DC

## 2019-03-03 NOTE — Progress Notes (Signed)
Cardiology Office Note:    Date:  03/03/2019   ID:  Keith Amble., DOB May 28, 1946, MRN [id number]  PCP:  Patient, No Pcp Per  Cardiologist:  Shirlee More, MD    Referring MD: No ref. provider found   ASSESSMENT:    1. Coronary artery disease of native artery of native heart with stable angina pectoris ([place])   2. S/P CABG x 5   3. Mixed hyperlipidemia   4. Essential hypertension    PLAN:    In order of problems listed above:  1. CAD - NSTEMI 11/19/18, CABG x5 11/21/18, cath 01/30/19 for chest pain. Occlusion of all SG graft. GDMT aspirin, beta blocker, statin. Continue Imdur. Will increase Ranexa dose as QTC 386 on EKG today. EKG shows sinus bradycardia with LAFB and t-wave inversion in V4, V5, V6 improved from previous.  2. S/P CABG x5 - See above for plan. 3. HTN - BP in the morning still elevated. Will increase Clonidine from 0.1mg  QHS to 0.2mg  QHS. He and his daughter will continue to keep careful BP log. 4. HLD - Lipid profile 01/31/19 with total cholesterol 88, HDL 26, LDL 34. Continue Simvastatin.  5. LAFB - Stable finding on EKG. No syncope, dizziness, lightheadedness.   Next appointment: 2 months  Medication Adjustments/Labs and Tests Ordered: Current medicines are reviewed at length with the patient today.  Concerns regarding medicines are outlined above.  No orders of the defined types were placed in this encounter.  No orders of the defined types were placed in this encounter.   Chief Complaint  Patient presents with   Follow-up   Coronary Artery Disease    History of Present Illness:    Keith Coate. is a 73 y.o. male with a hx of CAD s/p NSTEMI 11/19/18, CABG x5 11/21/18, He was last seen 02/12/2019. Cardiac cath  02/03/19 by Dr. Saunders Revel. He had patent PIMA-LAD with severe stenosis just beyond anastomosis and occlusion of all SVG. He was started in Imdur and DAPT. Echo 01/31/19 with EF 60-65% and impaired diastolic function.  At last office visit 02/12/19 he  was started on Ranexa for chest pain and bedtime Clonidine for elevated evening BPs - he presents today for follow up.   Has been feeling well. Appears in much better spirits than when he was last seen. Has had to take nitroglycerin only one time since he was last seen. One of his daughters is present for the visit and shares that they feel he is improving. He was even able to get out and walk at the flea market last weekend. Reports he has gained some strength and his energy is improved.   He and his daughters have been keeping a careful log of his blood pressure multiple times throughout the day. BP is elevated when he first wakes with systolic blood pressures 157W-620B and then improves throughout the day with systolic readings 559R-416L.   Compliance with diet, lifestyle and medications: Yes  Past Medical History:  Diagnosis Date   CAD (coronary artery disease)    s/p CABGX5 10/2018. LHC 02/03/2019: patent LIMA, all SVG occluded, no target for PCI.    Diabetes mellitus without complication ([place])    type 2   Hypertension     Past Surgical History:  Procedure Laterality Date   APPENDECTOMY     BACK SURGERY     CHOLECYSTECTOMY     CORONARY ANGIOPLASTY WITH STENT PLACEMENT  1998   CORONARY ARTERY BYPASS GRAFT N/A 11/21/2018  Procedure: CORONARY ARTERY BYPASS GRAFTING (CABG) TIMES FIVE USING LEFT INTERNAL MAMMARY ARTERY AND LEFT SAPHENOUS VEIN HARVESTED ENDOSCOPICALLY;  Surgeon: Gaye Pollack, MD;  Location: [facility];  Service: Open Heart Surgery;  Laterality: N/A;   LEFT HEART CATH AND CORONARY ANGIOGRAPHY N/A 11/19/2018   Procedure: LEFT HEART CATH AND CORONARY ANGIOGRAPHY;  Surgeon: Troy Sine, MD;  Location: [place] [facility];  Service: Cardiovascular;  Laterality: N/A;   LEFT HEART CATH AND CORS/GRAFTS ANGIOGRAPHY N/A 02/03/2019   Procedure: LEFT HEART CATH AND CORS/GRAFTS ANGIOGRAPHY;  Surgeon: Nelva Bush, MD;  Location: [place] [facility];  Service: Cardiovascular;   Laterality: N/A;   TEE WITHOUT CARDIOVERSION N/A 11/21/2018   Procedure: TRANSESOPHAGEAL ECHOCARDIOGRAM (TEE);  Surgeon: Gaye Pollack, MD;  Location: [facility];  Service: Open Heart Surgery;  Laterality: N/A;    Current Medications: Current Meds  Medication Sig   ALPRAZolam (XANAX) 0.25 MG tablet Take 1 tablet (0.25 mg total) by mouth 2 (two) times daily as needed for anxiety.   amLODipine (NORVASC) 10 MG tablet Take 1 tablet (10 mg total) by mouth every other day. Alternate with HZTZ   aspirin EC 81 MG tablet Take 81 mg by mouth daily.   cloNIDine (CATAPRES) 0.1 MG tablet Take 1 tablet (0.1 mg total) by mouth at bedtime.   clopidogrel (PLAVIX) 75 MG tablet Take 1 tablet (75 mg total) by mouth daily.   ferrous sulfate (FEROSUL) 325 (65 FE) MG tablet Take 325 mg by mouth at bedtime.   isosorbide mononitrate (IMDUR) 120 MG 24 hr tablet Take 1 tablet (120 mg total) by mouth daily.   metFORMIN (GLUCOPHAGE-XR) 750 MG 24 hr tablet Take 750 mg by mouth at bedtime.   metoprolol tartrate (LOPRESSOR) 25 MG tablet Take 1 tablet (25 mg total) by mouth 2 (two) times daily.   nitroGLYCERIN (NITROSTAT) 0.4 MG SL tablet Place 1 tablet (0.4 mg total) under the tongue every 5 (five) minutes as needed for chest pain.   omeprazole (PRILOSEC) 20 MG capsule Take 20 mg by mouth every morning.   ranolazine (RANEXA) 500 MG 12 hr tablet Take 1 tablet (500 mg total) by mouth 2 (two) times daily.   simvastatin (ZOCOR) 20 MG tablet Take 1 tablet (20 mg total) by mouth daily. (Patient taking differently: Take 20 mg by mouth daily at 6 PM. )   traMADol (ULTRAM) 50 MG tablet Take 1 tablet (50 mg total) by mouth every 6 (six) hours as needed for moderate pain.     Allergies:   Lipitor [atorvastatin calcium] and Penicillins   Social History   Socioeconomic History   Marital status: Married    Spouse name: Not on file   Number of children: Not on file   Years of education: Not on file   Highest  education level: Not on file  Occupational History   Not on file  Social Needs   Financial resource strain: Not on file   Food insecurity    Worry: Not on file    Inability: Not on file   Transportation needs    Medical: Not on file    Non-medical: Not on file  Tobacco Use   Smoking status: Never Smoker   Smokeless tobacco: Former Systems developer    Types: Chew  Substance and Sexual Activity   Alcohol use: Not Currently   Drug use: Never   Sexual activity: Not Currently  Lifestyle   Physical activity    Days per week: Not on file  Minutes per session: Not on file   Stress: Not on file  Relationships   Social connections    Talks on phone: Not on file    Gets together: Not on file    Attends religious service: Not on file    Active member of club or organization: Not on file    Attends meetings of clubs or organizations: Not on file    Relationship status: Not on file  Other Topics Concern   Not on file  Social History Narrative   Not on file     Family History: The patient's family history includes Cancer in his sister; Diabetes in his sister; Heart attack in his mother and sister; Stroke in his mother and sister.   ROS:   Review of Systems  Constitution: Negative for chills, fever and malaise/fatigue.  Cardiovascular: Positive for chest pain (one episode relieved by 1 nitroglycerin). Negative for dyspnea on exertion, irregular heartbeat, leg swelling, near-syncope and palpitations.  Respiratory: Negative for cough, shortness of breath and wheezing.   Gastrointestinal: Negative for nausea and vomiting.  Neurological: Negative for dizziness, light-headedness and weakness.   Please see the history of present illness.    All other systems reviewed and are negative.  EKGs/Labs/Other Studies Reviewed:    The following studies were reviewed today:  EKG:  EKG ordered today and personally reviewed.  The ekg ordered today demonstrates sinus bradycardia rate 45 with  LAFB QTC 368. Inverted t-waves in leads V4, V5, V6 improved from previous EKG.  Recent Labs: 11/22/2018: Magnesium 2.6 01/30/2019: ALT 25 02/04/2019: BUN 18; Creatinine, Ser 0.93; Hemoglobin 13.5; Platelets 120; Potassium 4.3; Sodium 138   Recent Lipid Panel    Component Value Date/Time   CHOL 88 01/31/2019 0637   CHOL 93 (L) 01/30/2019 1448   TRIG 139 01/31/2019 0637   HDL 26 (L) 01/31/2019 0637   HDL 30 (L) 01/30/2019 1448   CHOLHDL 3.4 01/31/2019 0637   VLDL 28 01/31/2019 0637   LDLCALC 34 01/31/2019 0637   LDLCALC 35 01/30/2019 1448    Physical Exam:    VS:  BP (!) 150/66 (BP Location: Right Arm, Patient Position: Sitting, Cuff Size: Normal)    Pulse (!) 51    Ht 5\' 9"  (1.753 m)    Wt 165 lb (74.8 kg)    SpO2 98%    BMI 24.37 kg/m     Wt Readings from Last 3 Encounters:  03/03/19 165 lb (74.8 kg)  02/12/19 163 lb 3.2 oz (74 kg)  02/04/19 157 lb 6.4 oz (71.4 kg)    GEN:  Well nourished, thin, well developed in no acute distress HEENT: Normal NECK: No JVD; No carotid bruits LYMPHATICS: No lymphadenopathy CARDIAC: RRR, no murmurs, rubs, gallops RESPIRATORY:  Clear to auscultation without rales, wheezing or rhonchi  ABDOMEN: Soft, non-tender, non-distended MUSCULOSKELETAL:  No edema; No deformity. RLE with healed SVG grafting scar. Midsternal incision and CT suture scars healing appropriately.  SKIN: Warm and dry NEUROLOGIC:  Alert and oriented x 3 PSYCHIATRIC:  Normal affect   Signed, Shirlee More, MD  03/03/2019 9:59 AM    [address]

## 2019-03-03 NOTE — Patient Instructions (Signed)
Medication Instructions:   START Clonidine 0.2mg  at bedtime  You make take TWO of your 0.1mg  tablets to use them up. We will send 0.2mg  tablets to the pharmacy for you.  START Ranexa 1,000mg  twice daily  You may take TWO of your 500mg  tablets to use them up. We will send 1000mg  tablets to the pharmacy for you.  If you need a refill on your cardiac medications before your next appointment, please call your pharmacy.   Lab work: None today.  If you have labs (blood work) drawn today and your tests are completely normal, you will receive your results only by: Marland Kitchen MyChart Message (if you have MyChart) OR . A paper copy in the mail If you have any lab test that is abnormal or we need to change your treatment, we will call you to review the results.  Testing/Procedures: None ordered today.  Follow-Up: At [facility], you and your health needs are our priority.  As part of our continuing mission to provide you with exceptional heart care, we have created designated Provider Care Teams.  These Care Teams include your primary Cardiologist (physician) and Advanced Practice Providers (APPs -  Physician Assistants and Nurse Practitioners) who all work together to provide you with the care you need, when you need it. You will need a follow up appointment in 2 months.  Please call our office 2 months in advance to schedule this appointment.  You may see Shirlee More, MD or another member of our [place] Provider Team in [place]: Jenne Campus, MD . Jyl Heinz, MD  Any Other Special Instructions Will Be Listed Below (If Applicable).   Continue to check your blood pressure daily and keep a log. We anticipate the high readings in the morning will improve with increasing the bedtime dose of Clonidine.   We are increasing your dose of Ranexa. The goal of this medicine is to decrease your chest pain.   Continue to use Nitroglycerin as needed for episodes of chest pain.    Ranolazine  tablets, extended release What is this medicine? RANOLAZINE (ra NOE la zeen) is a heart medicine. It is used to treat chronic chest pain (angina). This medicine must be taken regularly. It will not relieve an acute episode of chest pain. This medicine may be used for other purposes; ask your health care provider or pharmacist if you have questions. COMMON BRAND NAME(S): Ranexa What should I tell my health care provider before I take this medicine? They need to know if you have any of these conditions:  heart disease  irregular heartbeat  kidney disease  liver disease  low levels of potassium or magnesium in the blood  an unusual or allergic reaction to ranolazine, other medicines, foods, dyes, or preservatives  pregnant or trying to get pregnant  breast-feeding How should I use this medicine? Take this medicine by mouth with a glass of water. Follow the directions on the prescription label. Do not cut, crush, or chew this medicine. Take with or without food. Do not take this medication with grapefruit juice. Take your doses at regular intervals. Do not take your medicine more often then directed. Talk to your pediatrician regarding the use of this medicine in children. Special care may be needed. Overdosage: If you think you have taken too much of this medicine contact a poison control center or emergency room at once. NOTE: This medicine is only for you. Do not share this medicine with others. What if I miss a dose?  If you miss a dose, take it as soon as you can. If it is almost time for your next dose, take only that dose. Do not take double or extra doses. What may interact with this medicine? Do not take this medicine with any of the following medications:  antivirals for HIV or AIDS  cerivastatin  certain antibiotics like chloramphenicol, clarithromycin, dalfopristin; quinupristin, isoniazid, rifabutin, rifampin, rifapentine  certain medicines used for cancer like  imatinib, nilotinib  certain medicines for fungal infections like fluconazole, itraconazole, ketoconazole, posaconazole, voriconazole  certain medicines for irregular heart beat like dronedarone  certain medicines for seizures like carbamazepine, fosphenytoin, oxcarbazepine, phenobarbital, phenytoin  cisapride  conivaptan  cyclosporine  grapefruit or grapefruit juice  lumacaftor; ivacaftor  nefazodone  pimozide  quinacrine  St John's wort  thioridazine This medicine may also interact with the following medications:  alfuzosin  certain medicines for depression, anxiety, or psychotic disturbances like bupropion, citalopram, fluoxetine, fluphenazine, paroxetine, perphenazine, risperidone, sertraline, trifluoperazine  certain medicines for cholesterol like atorvastatin, lovastatin, simvastatin  certain medicines for stomach problems like octreotide, palonosetron, prochlorperazine  eplerenone  ergot alkaloids like dihydroergotamine, ergonovine, ergotamine, methylergonovine  metformin  nicardipine  other medicines that prolong the QT interval (cause an abnormal heart rhythm) like dofetilide, ziprasidone  sirolimus  tacrolimus This list may not describe all possible interactions. Give your health care provider a list of all the medicines, herbs, non-prescription drugs, or dietary supplements you use. Also tell them if you smoke, drink alcohol, or use illegal drugs. Some items may interact with your medicine. What should I watch for while using this medicine? Visit your doctor for regular check ups. Tell your doctor or healthcare professional if your symptoms do not start to get better or if they get worse. This medicine will not relieve an acute attack of angina or chest pain. This medicine can change your heart rhythm. Your health care provider may check your heart rhythm by ordering an electrocardiogram (ECG) while you are taking this medicine. You may get drowsy or  dizzy. Do not drive, use machinery, or do anything that needs mental alertness until you know how this medicine affects you. Do not stand or sit up quickly, especially if you are an older patient. This reduces the risk of dizzy or fainting spells. Alcohol may interfere with the effect of this medicine. Avoid alcoholic drinks. If you are scheduled for any medical or dental procedure, tell your healthcare provider that you are taking this medicine. This medicine can interact with other medicines used during surgery. What side effects may I notice from receiving this medicine? Side effects that you should report to your doctor or health care professional as soon as possible:  allergic reactions like skin rash, itching or hives, swelling of the face, lips, or tongue  breathing problems  changes in vision  fast, irregular or pounding heartbeat  feeling faint or lightheaded, falls  low or high blood pressure  numbness or tingling feelings  ringing in the ears  tremor or shakiness  slow heartbeat (fewer than 50 beats per minute)  swelling of the legs or feet Side effects that usually do not require medical attention (report to your doctor or health care professional if they continue or are bothersome):  constipation  drowsy  dry mouth  headache  nausea or vomiting  stomach upset This list may not describe all possible side effects. Call your doctor for medical advice about side effects. You may report side effects to FDA at 1-800-FDA-1088. Where should  I keep my medicine? Keep out of the reach of children. Store at room temperature between 15 and 30 degrees C (59 and 86 degrees F). Throw away any unused medicine after the expiration date. NOTE: This sheet is a summary. It may not cover all possible information. If you have questions about this medicine, talk to your doctor, pharmacist, or health care provider.  2020 Elsevier/Gold Standard (2018-07-09 09:18:49)

## 2019-05-05 NOTE — Progress Notes (Signed)
Cardiology Office Note:    Date:  05/07/2019   ID:  Keith Amble., DOB 08/25/1945, MRN [id number]  PCP:  Patient, No Pcp Per  Cardiologist:  Shirlee More, MD    Referring MD: No ref. provider found    ASSESSMENT:    1. Coronary artery disease of native artery of native heart with stable angina pectoris ([place])   2. S/P CABG x 5   3. Essential hypertension   4. Mixed hyperlipidemia   5. Type 2 diabetes mellitus without complication, without long-term current use of insulin (HCC)    PLAN:    In order of problems listed above:  1. He is improved New York Heart Association class II and despite a early occlusion of vein grafts he has good quality of life and will continue intensive medical treatment with dual antiplatelet aspirin clopidogrel beta-blocker calcium channel blocker oral nitrates ranolazine and nitroglycerin prior to strenuous activity.  I think he is a very poor candidate for elective repeat bypass surgery 2. Hypertension is improved previously resistant his blood pressures are consistently less than 130 at home and will continue treatment including calcium channel blocker beta-blocker and as needed clonidine.  Dyslipidemia stable on his intermediate intensity statin most recent lipid profile done 2 months ago has an LDL 34 cholesterol 88 and HDL of 26 with normal liver function 3. Stable diabetes continue treatment metformin have a second agent is needed SGLT2 inhibitor would be appropriate  Next appointment: 3 months   Medication Adjustments/Labs and Tests Ordered: Current medicines are reviewed at length with the patient today.  Concerns regarding medicines are outlined above.  No orders of the defined types were placed in this encounter.  Meds ordered this encounter  Medications  . ranolazine (RANEXA) 500 MG 12 hr tablet    Sig: Take 1 tablet (500 mg total) by mouth daily.    Dispense:  30 tablet    Refill:  3  . nitroGLYCERIN (NITROSTAT) 0.4 MG SL tablet   Sig: Place 1 tablet (0.4 mg total) under the tongue every 5 (five) minutes as needed for chest pain.    Dispense:  25 tablet    Refill:  3    Chief Complaint  Patient presents with  . Follow-up  . Coronary Artery Disease  . Hypertension    History of Present Illness:    Keith Laidig. is a 73 y.o. male with a hx of CAD s/p NSTEMI 11/19/18, CABG x5 11/21/18. Cardiac cath  02/03/19 by Dr. Saunders Revel. He had patent LIMA-LAD with severe stenosis just beyond anastomosis and occlusion of all SVG. He was started in Imdur and DAPT. Echo 01/31/19 with EF 60-65% and impaired diastolic function.  At  office visit 02/12/19 he was started on Ranexa for chest pain and bedtime Clonidine for elevated evening BPs.  He was last seen 03/03/2019. Compliance with diet, lifestyle and medications: Yes  Slowly and surely he is doing better his only problem is when he walks a distance uphill he gets typical angina precordial discomfort relieved with rest.  We discussed taking nitroglycerin tablet before these activities he is interested he feels he is lightheaded when he takes a second dose ranolazine and will going to do is take it once daily.  Other than that he has had no anginal discomfort edema shortness of breath chest pain palpitation or syncope and is pleased with his quality of life. Past Medical History:  Diagnosis Date  . CAD (coronary artery disease)  s/p CABGX5 10/2018. LHC 02/03/2019: patent LIMA, all SVG occluded, no target for PCI.   . Diabetes mellitus without complication ([place])    type 2  . Hypertension     Past Surgical History:  Procedure Laterality Date  . APPENDECTOMY    . BACK SURGERY    . CHOLECYSTECTOMY    . CORONARY ANGIOPLASTY WITH STENT PLACEMENT  1998  . CORONARY ARTERY BYPASS GRAFT N/A 11/21/2018   Procedure: CORONARY ARTERY BYPASS GRAFTING (CABG) TIMES FIVE USING LEFT INTERNAL MAMMARY ARTERY AND LEFT SAPHENOUS VEIN HARVESTED ENDOSCOPICALLY;  Surgeon: Gaye Pollack, MD;  Location: [facility];  Service: Open Heart Surgery;  Laterality: N/A;  . LEFT HEART CATH AND CORONARY ANGIOGRAPHY N/A 11/19/2018   Procedure: LEFT HEART CATH AND CORONARY ANGIOGRAPHY;  Surgeon: Troy Sine, MD;  Location: [facility];  Service: Cardiovascular;  Laterality: N/A;  . LEFT HEART CATH AND CORS/GRAFTS ANGIOGRAPHY N/A 02/03/2019   Procedure: LEFT HEART CATH AND CORS/GRAFTS ANGIOGRAPHY;  Surgeon: Nelva Bush, MD;  Location: [facility];  Service: Cardiovascular;  Laterality: N/A;  . TEE WITHOUT CARDIOVERSION N/A 11/21/2018   Procedure: TRANSESOPHAGEAL ECHOCARDIOGRAM (TEE);  Surgeon: Gaye Pollack, MD;  Location: [facility];  Service: Open Heart Surgery;  Laterality: N/A;    Current Medications: Current Meds  Medication Sig  . ALPRAZolam (XANAX) 0.25 MG tablet Take 1 tablet (0.25 mg total) by mouth 2 (two) times daily as needed for anxiety.  Marland Kitchen amLODipine (NORVASC) 10 MG tablet Take 1 tablet (10 mg total) by mouth every other day. Alternate with HZTZ  . aspirin EC 81 MG tablet Take 81 mg by mouth daily.  . cloNIDine (CATAPRES) 0.2 MG tablet Take 1 tablet (0.2 mg total) by mouth at bedtime.  . clopidogrel (PLAVIX) 75 MG tablet Take 1 tablet (75 mg total) by mouth daily.  . ferrous sulfate (FEROSUL) 325 (65 FE) MG tablet Take 325 mg by mouth at bedtime.  . isosorbide mononitrate (IMDUR) 120 MG 24 hr tablet Take 1 tablet (120 mg total) by mouth daily.  . metFORMIN (GLUCOPHAGE-XR) 750 MG 24 hr tablet Take 750 mg by mouth at bedtime.  . metoprolol tartrate (LOPRESSOR) 25 MG tablet Take 1 tablet (25 mg total) by mouth 2 (two) times daily.  . nitroGLYCERIN (NITROSTAT) 0.4 MG SL tablet Place 1 tablet (0.4 mg total) under the tongue every 5 (five) minutes as needed for chest pain.  Marland Kitchen omeprazole (PRILOSEC) 20 MG capsule Take 20 mg by mouth every morning.  . ranolazine (RANEXA) 500 MG 12 hr tablet Take 1 tablet (500 mg total) by mouth daily.  . simvastatin (ZOCOR) 20 MG tablet Take 1 tablet (20 mg  total) by mouth daily at 6 PM.  . traMADol (ULTRAM) 50 MG tablet Take 1 tablet (50 mg total) by mouth every 6 (six) hours as needed for moderate pain.  . [DISCONTINUED] nitroGLYCERIN (NITROSTAT) 0.4 MG SL tablet Place 1 tablet (0.4 mg total) under the tongue every 5 (five) minutes as needed for chest pain.  . [DISCONTINUED] ranolazine (RANEXA) 1000 MG SR tablet Take 1 tablet (1,000 mg total) by mouth 2 (two) times daily.     Allergies:   Lipitor [atorvastatin calcium] and Penicillins   Social History   Socioeconomic History  . Marital status: Married    Spouse name: Not on file  . Number of children: Not on file  . Years of education: Not on file  . Highest education level: Not on file  Occupational History  .  Not on file  Social Needs  . Financial resource strain: Not on file  . Food insecurity    Worry: Not on file    Inability: Not on file  . Transportation needs    Medical: Not on file    Non-medical: Not on file  Tobacco Use  . Smoking status: Never Smoker  . Smokeless tobacco: Former Systems developer    Types: Chew  Substance and Sexual Activity  . Alcohol use: Not Currently  . Drug use: Never  . Sexual activity: Not Currently  Lifestyle  . Physical activity    Days per week: Not on file    Minutes per session: Not on file  . Stress: Not on file  Relationships  . Social Herbalist on phone: Not on file    Gets together: Not on file    Attends religious service: Not on file    Active member of club or organization: Not on file    Attends meetings of clubs or organizations: Not on file    Relationship status: Not on file  Other Topics Concern  . Not on file  Social History Narrative  . Not on file     Family History: The patient's family history includes Cancer in his sister; Diabetes in his sister; Heart attack in his mother and sister; Stroke in his mother and sister. ROS:   Please see the history of present illness.    All other systems reviewed and are  negative.  EKGs/Labs/Other Studies Reviewed:    The following studies were reviewed today:   Recent Labs: 11/22/2018: Magnesium 2.6 01/30/2019: ALT 25 02/04/2019: BUN 18; Creatinine, Ser 0.93; Hemoglobin 13.5; Platelets 120; Potassium 4.3; Sodium 138  Recent Lipid Panel    Component Value Date/Time   CHOL 88 01/31/2019 0637   CHOL 93 (L) 01/30/2019 1448   TRIG 139 01/31/2019 0637   HDL 26 (L) 01/31/2019 0637   HDL 30 (L) 01/30/2019 1448   CHOLHDL 3.4 01/31/2019 0637   VLDL 28 01/31/2019 0637   LDLCALC 34 01/31/2019 0637   LDLCALC 35 01/30/2019 1448    Physical Exam:    VS:  BP 130/86 (BP Location: Left Arm, Patient Position: Sitting, Cuff Size: Normal)   Pulse (!) 48   Ht 5\' 9"  (1.753 m)   Wt 173 lb 6.4 oz (78.7 kg)   SpO2 98%   BMI 25.61 kg/m     Wt Readings from Last 3 Encounters:  05/07/19 173 lb 6.4 oz (78.7 kg)  03/03/19 165 lb (74.8 kg)  02/12/19 163 lb 3.2 oz (74 kg)     GEN:  Well nourished, well developed in no acute distress HEENT: Normal NECK: No JVD; No carotid bruits LYMPHATICS: No lymphadenopathy CARDIAC: RRR, no murmurs, rubs, gallops RESPIRATORY:  Clear to auscultation without rales, wheezing or rhonchi  ABDOMEN: Soft, non-tender, non-distended MUSCULOSKELETAL:  No edema; No deformity  SKIN: Warm and dry NEUROLOGIC:  Alert and oriented x 3 PSYCHIATRIC:  Normal affect    Signed, Shirlee More, MD  05/07/2019 4:24 PM    [facility]

## 2019-05-07 ENCOUNTER — Ambulatory Visit (INDEPENDENT_AMBULATORY_CARE_PROVIDER_SITE_OTHER): Payer: PPO | Admitting: Cardiology

## 2019-05-07 ENCOUNTER — Other Ambulatory Visit: Payer: Self-pay

## 2019-05-07 ENCOUNTER — Encounter: Payer: Self-pay | Admitting: Cardiology

## 2019-05-07 VITALS — BP 130/86 | HR 48 | Ht 69.0 in | Wt 173.4 lb

## 2019-05-07 DIAGNOSIS — E119 Type 2 diabetes mellitus without complications: Secondary | ICD-10-CM | POA: Diagnosis not present

## 2019-05-07 DIAGNOSIS — I10 Essential (primary) hypertension: Secondary | ICD-10-CM

## 2019-05-07 DIAGNOSIS — I25118 Atherosclerotic heart disease of native coronary artery with other forms of angina pectoris: Secondary | ICD-10-CM | POA: Diagnosis not present

## 2019-05-07 DIAGNOSIS — Z951 Presence of aortocoronary bypass graft: Secondary | ICD-10-CM

## 2019-05-07 DIAGNOSIS — E782 Mixed hyperlipidemia: Secondary | ICD-10-CM

## 2019-05-07 MED ORDER — RANOLAZINE ER 500 MG PO TB12: 500.0000 mg | ORAL_TABLET | Freq: Every day | ORAL | 3 refills | Status: DC

## 2019-05-07 MED ORDER — NITROGLYCERIN 0.4 MG SL SUBL: 0.4000 mg | SUBLINGUAL_TABLET | SUBLINGUAL | 3 refills | Status: DC | PRN

## 2019-05-07 NOTE — Patient Instructions (Signed)
Medication Instructions:  Your physician has recommended you make the following change in your medication:  DECREASE ranolazine (ranexa) 500 mg: Take 1 tablet daily  CHANGE nitroglycerin: When having chest pain, stop what you are doing and sit down. Take 1 nitro, wait 5 minutes. Still having chest pain, take 1 nitro, wait 5 minutes. Still having chest pain, take 1 nitro, dial 911. Total of 3 nitro in 15 minutes. Also, take 1 tablet prior to any strenuous activity.   If you need a refill on your cardiac medications before your next appointment, please call your pharmacy.   Lab work: None  If you have labs (blood work) drawn today and your tests are completely normal, you will receive your results only by: Marland Kitchen MyChart Message (if you have MyChart) OR . A paper copy in the mail If you have any lab test that is abnormal or we need to change your treatment, we will call you to review the results.  Testing/Procedures: None  Follow-Up: At [facility], you and your health needs are our priority.  As part of our continuing mission to provide you with exceptional heart care, we have created designated Provider Care Teams.  These Care Teams include your primary Cardiologist (physician) and Advanced Practice Providers (APPs -  Physician Assistants and Nurse Practitioners) who all work together to provide you with the care you need, when you need it. You will need a follow up appointment in 3 months.  Please call our office 2 months in advance to schedule this appointment.

## 2019-07-17 NOTE — Progress Notes (Deleted)
Cardiology Office Note:    Date:  07/17/2019   ID:  Keith Amble., DOB Jul 26, 1946, MRN [id number]  PCP:  Patient, No Pcp Per  Cardiologist:  Shirlee More, MD    Referring MD: No ref. provider found    ASSESSMENT:    No diagnosis found. PLAN:    In order of problems listed above:  1. ***   Next appointment: ***   Medication Adjustments/Labs and Tests Ordered: Current medicines are reviewed at length with the patient today.  Concerns regarding medicines are outlined above.  No orders of the defined types were placed in this encounter.  No orders of the defined types were placed in this encounter.   No chief complaint on file.   History of Present Illness:    Keith Fulmer. is a 73 y.o. male with a hx of CAD s/p NSTEMI 11/19/18, CABG x5 11/21/18. Cardiac cath  02/03/19 by Dr. Saunders Revel. He had patent LIMA-LAD with severe stenosis just beyond anastomosis and occlusion of all SVG. He was started in Imdur and DAPT. Echo 01/31/19 with EF 60-65% and impaired diastolic function.  At  office visit 02/12/19 he was started on Ranexa for chest pain and bedtime Clonidine for elevated evening BPs.  He was last seen 05/07/2019. Compliance with diet, lifestyle and medications: *** Past Medical History:  Diagnosis Date  . CAD (coronary artery disease)    s/p CABGX5 10/2018. LHC 02/03/2019: patent LIMA, all SVG occluded, no target for PCI.   . Diabetes mellitus without complication ([place])    type 2  . Hypertension     Past Surgical History:  Procedure Laterality Date  . APPENDECTOMY    . BACK SURGERY    . CHOLECYSTECTOMY    . CORONARY ANGIOPLASTY WITH STENT PLACEMENT  1998  . CORONARY ARTERY BYPASS GRAFT N/A 11/21/2018   Procedure: CORONARY ARTERY BYPASS GRAFTING (CABG) TIMES FIVE USING LEFT INTERNAL MAMMARY ARTERY AND LEFT SAPHENOUS VEIN HARVESTED ENDOSCOPICALLY;  Surgeon: Gaye Pollack, MD;  Location: [facility];  Service: Open Heart Surgery;  Laterality: N/A;  . LEFT HEART CATH AND  CORONARY ANGIOGRAPHY N/A 11/19/2018   Procedure: LEFT HEART CATH AND CORONARY ANGIOGRAPHY;  Surgeon: Troy Sine, MD;  Location: [facility];  Service: Cardiovascular;  Laterality: N/A;  . LEFT HEART CATH AND CORS/GRAFTS ANGIOGRAPHY N/A 02/03/2019   Procedure: LEFT HEART CATH AND CORS/GRAFTS ANGIOGRAPHY;  Surgeon: Nelva Bush, MD;  Location: [facility];  Service: Cardiovascular;  Laterality: N/A;  . TEE WITHOUT CARDIOVERSION N/A 11/21/2018   Procedure: TRANSESOPHAGEAL ECHOCARDIOGRAM (TEE);  Surgeon: Gaye Pollack, MD;  Location: [facility];  Service: Open Heart Surgery;  Laterality: N/A;    Current Medications: No outpatient medications have been marked as taking for the 07/18/19 encounter (Appointment) with Richardo Priest, MD.     Allergies:   Lipitor [atorvastatin calcium] and Penicillins   Social History   Socioeconomic History  . Marital status: Married    Spouse name: Not on file  . Number of children: Not on file  . Years of education: Not on file  . Highest education level: Not on file  Occupational History  . Not on file  Tobacco Use  . Smoking status: Never Smoker  . Smokeless tobacco: Former Systems developer    Types: Chew  Substance and Sexual Activity  . Alcohol use: Not Currently  . Drug use: Never  . Sexual activity: Not Currently  Other Topics Concern  . Not on file  Social History Narrative  . Not on file   Social Determinants of Health   Financial Resource Strain:   . Difficulty of Paying Living Expenses: Not on file  Food Insecurity:   . Worried About Charity fundraiser in the Last Year: Not on file  . Ran Out of Food in the Last Year: Not on file  Transportation Needs:   . Lack of Transportation (Medical): Not on file  . Lack of Transportation (Non-Medical): Not on file  Physical Activity:   . Days of Exercise per Week: Not on file  . Minutes of Exercise per Session: Not on file  Stress:   . Feeling of Stress : Not on file  Social  Connections:   . Frequency of Communication with Friends and Family: Not on file  . Frequency of Social Gatherings with Friends and Family: Not on file  . Attends Religious Services: Not on file  . Active Member of Clubs or Organizations: Not on file  . Attends Archivist Meetings: Not on file  . Marital Status: Not on file     Family History: The patient's ***family history includes Cancer in his sister; Diabetes in his sister; Heart attack in his mother and sister; Stroke in his mother and sister. ROS:   Please see the history of present illness.    All other systems reviewed and are negative.  EKGs/Labs/Other Studies Reviewed:    The following studies were reviewed today:  EKG:  EKG ordered today and personally reviewed.  The ekg ordered today demonstrates ***  Recent Labs: 11/22/2018: Magnesium 2.6 01/30/2019: ALT 25 02/04/2019: BUN 18; Creatinine, Ser 0.93; Hemoglobin 13.5; Platelets 120; Potassium 4.3; Sodium 138  Recent Lipid Panel    Component Value Date/Time   CHOL 88 01/31/2019 0637   CHOL 93 (L) 01/30/2019 1448   TRIG 139 01/31/2019 0637   HDL 26 (L) 01/31/2019 0637   HDL 30 (L) 01/30/2019 1448   CHOLHDL 3.4 01/31/2019 0637   VLDL 28 01/31/2019 0637   LDLCALC 34 01/31/2019 0637   LDLCALC 35 01/30/2019 1448    Physical Exam:    VS:  There were no vitals taken for this visit.    Wt Readings from Last 3 Encounters:  05/07/19 173 lb 6.4 oz (78.7 kg)  03/03/19 165 lb (74.8 kg)  02/12/19 163 lb 3.2 oz (74 kg)     GEN: *** Well nourished, well developed in no acute distress HEENT: Normal NECK: No JVD; No carotid bruits LYMPHATICS: No lymphadenopathy CARDIAC: ***RRR, no murmurs, rubs, gallops RESPIRATORY:  Clear to auscultation without rales, wheezing or rhonchi  ABDOMEN: Soft, non-tender, non-distended MUSCULOSKELETAL:  No edema; No deformity  SKIN: Warm and dry NEUROLOGIC:  Alert and oriented x 3 PSYCHIATRIC:  Normal affect    Signed, Shirlee More, MD  07/17/2019 7:48 PM    [facility]

## 2019-07-18 ENCOUNTER — Ambulatory Visit: Payer: PPO | Admitting: Cardiology

## 2019-08-27 ENCOUNTER — Telehealth: Payer: Self-pay | Admitting: Cardiology

## 2019-08-27 NOTE — Telephone Encounter (Signed)
Spoke with patient's daughter who states that her father has mental limitations and that she needs to accompany him to his appointment. I advised her that it would be okay and I would put a note in for her.

## 2019-08-27 NOTE — Telephone Encounter (Signed)
Patient's daughter calling to state that she needs to come with her father to his appt tomorrow at 8:40am. He cannot read nor write and has a hard time retaining information.

## 2019-08-27 NOTE — Progress Notes (Signed)
Cardiology Office Note:    Date:  08/28/2019   ID:  Keith Amble., DOB [birth year], MRN [id number]  PCP: Dr. Jannette Lucero  Cardiologist:  Keith More, Lucero    Referring Lucero: No ref. provider found    ASSESSMENT:    1. Coronary artery disease of native artery of native heart with stable angina pectoris ([place])   2. Essential hypertension   3. Mixed hyperlipidemia   4. Nausea    PLAN:    In order of problems listed above:  1. CAD is stable he has had bypass early graft occlusion has stable exertional angina I offered to increase his nitroglycerin but he told me now and tells me he often does not take it because he attributes his nausea to it I think we do best to continue his current medications compliance with taking him to stop Metformin which was clearly the culprit. 2. Stable continue current treatment I encouraged him to take his medications as directed check his blood pressure twice a day a few hours after meds in the morning and in the early evening and not to check his blood pressure repeatedly through the day taking extra medications and with holding him. 3. Continue with statin access the labs done his PCP office 4. Continue Metformin sure he needs to be on a medication for type 2 diabetes with A1c of 6   Next appointment: 6 months   Medication Adjustments/Labs and Tests Ordered: Current medicines are reviewed at length with the patient today.  Concerns regarding medicines are outlined above.  No orders of the defined types were placed in this encounter.  No orders of the defined types were placed in this encounter.   Chief Complaint  Patient presents with  . Coronary Artery Disease    History of Present Illness:    Keith Kone. is a 74 y.o. adult with a hx of CAD s/p NSTEMI 11/19/18, CABG x5 11/21/18. Cardiac cath  02/03/19 by Keith Lucero. He had patent LIMA-LAD with severe stenosis just beyond anastomosis and occlusion of all SVG. He was started in Imdur and DAPT.  Echo 01/31/19 with EF 60-65% and impaired diastolic function.  At  office visit 02/12/19 he was started on Ranexa for chest pain and bedtime Clonidine for elevated evening BPs.  He wants last seen 05/07/2019. Compliance with diet, lifestyle and medications: Yes  Fortunately his daughter is present is a difficult visit man is taking his medicines he is holding him and he is checking his blood pressure repetitively through the day and is having erratic readings.  In particular he is not taking his clonidine every evening.  He is talking to me about having chest pain and sounds like it is all the time and his daughter says it is perhaps once a week its exertional he takes nitroglycerin and is relieved.  Skipping cardiac medications because he is nauseous his A1c is 6 he is on Metformin I suspect that is the culprit and will have him stop and monitor blood sugars and follow-up with his PCP.  He is not having unstable angina no edema shortness of breath palpitation or syncope. Past Medical History:  Diagnosis Date  . CAD (coronary artery disease)    s/p CABGX5 10/2018. LHC 02/03/2019: patent LIMA, all SVG occluded, no target for PCI.   . Diabetes mellitus without complication ([place])    type 2  . Hypertension     Past Surgical History:  Procedure Laterality Date  . APPENDECTOMY    .  BACK SURGERY    . CHOLECYSTECTOMY    . CORONARY ANGIOPLASTY WITH STENT PLACEMENT  1998  . CORONARY ARTERY BYPASS GRAFT N/A 11/21/2018   Procedure: CORONARY ARTERY BYPASS GRAFTING (CABG) TIMES FIVE USING LEFT INTERNAL MAMMARY ARTERY AND LEFT SAPHENOUS VEIN HARVESTED ENDOSCOPICALLY;  Surgeon: Keith Lucero;  Location: [facility];  Service: Open Heart Surgery;  Laterality: N/A;  . LEFT HEART CATH AND CORONARY ANGIOGRAPHY N/A 11/19/2018   Procedure: LEFT HEART CATH AND CORONARY ANGIOGRAPHY;  Surgeon: Keith Lucero;  Location: [facility];  Service: Cardiovascular;  Laterality: N/A;  . LEFT HEART CATH AND CORS/GRAFTS  ANGIOGRAPHY N/A 02/03/2019   Procedure: LEFT HEART CATH AND CORS/GRAFTS ANGIOGRAPHY;  Surgeon: Keith Lucero;  Location: [facility];  Service: Cardiovascular;  Laterality: N/A;  . TEE WITHOUT CARDIOVERSION N/A 11/21/2018   Procedure: TRANSESOPHAGEAL ECHOCARDIOGRAM (TEE);  Surgeon: Keith Lucero;  Location: [facility];  Service: Open Heart Surgery;  Laterality: N/A;    Current Medications: Current Meds  Medication Sig  . ALPRAZolam (XANAX) 0.25 MG tablet Take 1 tablet (0.25 mg total) by mouth 2 (two) times daily as needed for anxiety.  Marland Kitchen amLODipine (NORVASC) 10 MG tablet Take 1 tablet (10 mg total) by mouth every other day. Alternate with HZTZ  . aspirin EC 81 MG tablet Take 81 mg by mouth daily.  . cloNIDine (CATAPRES) 0.2 MG tablet Take 1 tablet (0.2 mg total) by mouth at bedtime.  . clopidogrel (PLAVIX) 75 MG tablet Take 1 tablet (75 mg total) by mouth daily.  . ferrous sulfate (FEROSUL) 325 (65 FE) MG tablet Take 325 mg by mouth at bedtime.  . isosorbide mononitrate (IMDUR) 120 MG 24 hr tablet Take 1 tablet (120 mg total) by mouth daily.  . metFORMIN (GLUCOPHAGE-XR) 750 MG 24 hr tablet Take 750 mg by mouth at bedtime.  . metoprolol tartrate (LOPRESSOR) 25 MG tablet Take 1 tablet (25 mg total) by mouth 2 (two) times daily.  Marland Kitchen omeprazole (PRILOSEC) 20 MG capsule Take 20 mg by mouth every morning.  . ranolazine (RANEXA) 500 MG 12 hr tablet Take 1 tablet (500 mg total) by mouth daily.  . simvastatin (ZOCOR) 20 MG tablet Take 1 tablet (20 mg total) by mouth daily at 6 PM.  . traMADol (ULTRAM) 50 MG tablet Take 1 tablet (50 mg total) by mouth every 6 (six) hours as needed for moderate pain.     Allergies:   Lipitor [atorvastatin calcium] and Penicillins   Social History   Socioeconomic History  . Marital status: Married    Spouse name: Not on file  . Number of children: Not on file  . Years of education: Not on file  . Highest education level: Not on file  Occupational  History  . Not on file  Tobacco Use  . Smoking status: Never Smoker  . Smokeless tobacco: Former Systems developer    Types: Chew  Substance and Sexual Activity  . Alcohol use: Not Currently  . Drug use: Never  . Sexual activity: Not Currently  Other Topics Concern  . Not on file  Social History Narrative  . Not on file   Social Determinants of Health   Financial Resource Strain:   . Difficulty of Paying Living Expenses: Not on file  Food Insecurity:   . Worried About Charity fundraiser in the Last Year: Not on file  . Ran Out of Food in the Last Year: Not on file  Transportation Needs:   .  Lack of Transportation (Medical): Not on file  . Lack of Transportation (Non-Medical): Not on file  Physical Activity:   . Days of Exercise per Week: Not on file  . Minutes of Exercise per Session: Not on file  Stress:   . Feeling of Stress : Not on file  Social Connections:   . Frequency of Communication with Friends and Family: Not on file  . Frequency of Social Gatherings with Friends and Family: Not on file  . Attends Religious Services: Not on file  . Active Member of Clubs or Organizations: Not on file  . Attends Archivist Meetings: Not on file  . Marital Status: Not on file     Family History: The patient's family history includes Cancer in his sister; Diabetes in his sister; Heart attack in his mother and sister; Stroke in his mother and sister. ROS:   Please see the history of present illness.    All other systems reviewed and are negative.  EKGs/Labs/Other Studies Reviewed:    The following studies were reviewed today:  EKG:  EKG ordered today and personally reviewed.The ekg ordered today demonstrates sinus rhythm left anterior hemiblock ST-T abnormality  Recent Labs: 11/22/2018: Magnesium 2.6 01/30/2019: ALT 25 02/04/2019: BUN 18; Creatinine, Ser 0.93; Hemoglobin 13.5; Platelets 120; Potassium 4.3; Sodium 138  Recent Lipid Panel    Component Value Date/Time   CHOL 88  01/31/2019 0637   CHOL 93 (L) 01/30/2019 1448   TRIG 139 01/31/2019 0637   HDL 26 (L) 01/31/2019 0637   HDL 30 (L) 01/30/2019 1448   CHOLHDL 3.4 01/31/2019 0637   VLDL 28 01/31/2019 0637   LDLCALC 34 01/31/2019 0637   LDLCALC 35 01/30/2019 1448    Physical Exam:    VS:  BP 120/62 (BP Location: Left Arm, Patient Position: Sitting, Cuff Size: Normal)   Pulse 63   Ht 5\' 9"  (1.753 m)   Wt 172 lb (78 kg)   SpO2 99%   BMI 25.40 kg/m     Wt Readings from Last 3 Encounters:  08/28/19 172 lb (78 kg)  05/07/19 173 lb 6.4 oz (78.7 kg)  03/03/19 165 lb (74.8 kg)     GEN: He is quite anxious little agitated well nourished, well developed in no acute distress HEENT: Normal NECK: No JVD; No carotid bruits LYMPHATICS: No lymphadenopathy CARDIAC: RRR, no murmurs, rubs, gallops RESPIRATORY:  Clear to auscultation without rales, wheezing or rhonchi  ABDOMEN: Soft, non-tender, non-distended MUSCULOSKELETAL:  No edema; No deformity  SKIN: Warm and dry NEUROLOGIC:  Alert and oriented x 3 PSYCHIATRIC:  Normal affect    Signed, Keith More, Lucero  08/28/2019 9:20 AM    [place]

## 2019-08-28 ENCOUNTER — Ambulatory Visit (INDEPENDENT_AMBULATORY_CARE_PROVIDER_SITE_OTHER): Payer: PPO | Admitting: Cardiology

## 2019-08-28 ENCOUNTER — Other Ambulatory Visit: Payer: Self-pay

## 2019-08-28 ENCOUNTER — Encounter: Payer: Self-pay | Admitting: Cardiology

## 2019-08-28 VITALS — BP 120/62 | HR 63 | Ht 69.0 in | Wt 172.0 lb

## 2019-08-28 DIAGNOSIS — R110 Nausea: Secondary | ICD-10-CM | POA: Diagnosis not present

## 2019-08-28 DIAGNOSIS — I10 Essential (primary) hypertension: Secondary | ICD-10-CM | POA: Diagnosis not present

## 2019-08-28 DIAGNOSIS — I25118 Atherosclerotic heart disease of native coronary artery with other forms of angina pectoris: Secondary | ICD-10-CM | POA: Diagnosis not present

## 2019-08-28 DIAGNOSIS — E782 Mixed hyperlipidemia: Secondary | ICD-10-CM | POA: Diagnosis not present

## 2019-08-28 NOTE — Patient Instructions (Signed)
Medication Instructions:  1) Take clonidine at bedtime   2) Discontinue Metformin   *If you need a refill on your cardiac medications before your next appointment, please call your pharmacy*  Lab Work: None ordered   If you have labs (blood work) drawn today and your tests are completely normal, you will receive your results only by: Marland Kitchen MyChart Message (if you have MyChart) OR . A paper copy in the mail If you have any lab test that is abnormal or we need to change your treatment, we will call you to review the results.  Testing/Procedures: None ordered   Follow-Up: At [facility], you and your health needs are our priority.  As part of our continuing mission to provide you with exceptional heart care, we have created designated Provider Care Teams.  These Care Teams include your primary Cardiologist (physician) and Advanced Practice Providers (APPs -  Physician Assistants and Nurse Practitioners) who all work together to provide you with the care you need, when you need it.  Your next appointment:   3 month(s)  The format for your next appointment:   In Person  Provider:   Shirlee More, MD  Other Instructions Check your blood pressure twice daily at 9 AM and 4 PM

## 2019-09-17 ENCOUNTER — Other Ambulatory Visit: Payer: Self-pay | Admitting: Cardiology

## 2019-10-17 ENCOUNTER — Other Ambulatory Visit: Payer: Self-pay | Admitting: Cardiology

## 2019-10-27 ENCOUNTER — Other Ambulatory Visit: Payer: Self-pay | Admitting: Cardiology

## 2019-11-03 ENCOUNTER — Telehealth: Payer: Self-pay | Admitting: Cardiology

## 2019-11-03 DIAGNOSIS — I25118 Atherosclerotic heart disease of native coronary artery with other forms of angina pectoris: Secondary | ICD-10-CM

## 2019-11-03 DIAGNOSIS — I214 Non-ST elevation (NSTEMI) myocardial infarction: Secondary | ICD-10-CM

## 2019-11-03 DIAGNOSIS — I10 Essential (primary) hypertension: Secondary | ICD-10-CM

## 2019-11-03 NOTE — Telephone Encounter (Signed)
Pam, Daughter of the patient called. She was hoping to have Dr. Bettina Gavia start the referral process to the TMLR Surgeon in [place]. The daughter states 4 of the 5 bypasses failed and this would be the next step for the patient. Please call pam, as she sets up all of the appointments for the patient

## 2019-11-03 NOTE — Telephone Encounter (Signed)
Will route to MD and nurse to advise if okay to place this referral.

## 2019-11-03 NOTE — Telephone Encounter (Signed)
Called and LVM advising them to call back on what they would like to do with this referral and where it should go.  Will route back to [facility] Triage to monitor.   Thanks!

## 2019-11-03 NOTE — Telephone Encounter (Signed)
I looked the surgeon who did it is no longer [facility] and is not listed as a procedure that is performed.  Does he want o go to [facility] and be seen by the CT surgery to see if they are still doing the procedure there?

## 2019-11-04 NOTE — Telephone Encounter (Signed)
Follow up   Patient's daughter states that she would like Dr. Veneta Penton in [place], [place].

## 2019-11-05 NOTE — Telephone Encounter (Signed)
Referral faxed to  Dr. Veneta Penton in [place].

## 2019-11-06 ENCOUNTER — Telehealth: Payer: Self-pay

## 2019-11-06 DIAGNOSIS — I25118 Atherosclerotic heart disease of native coronary artery with other forms of angina pectoris: Secondary | ICD-10-CM

## 2019-11-06 DIAGNOSIS — I10 Essential (primary) hypertension: Secondary | ICD-10-CM

## 2019-11-06 DIAGNOSIS — I214 Non-ST elevation (NSTEMI) myocardial infarction: Secondary | ICD-10-CM

## 2019-11-06 NOTE — Telephone Encounter (Signed)
-----   Message from Ashok Norris, RN sent at 11/06/2019 11:08 AM EDT ----- Please address.  ----- Message ----- From: Richardo Priest, MD Sent: 11/04/2019  12:20 PM EDT To: Ashok Norris, RN  I spoke to Dr Michell Heinrich at [facility] to see him re TMLR please refer to him at [facility]

## 2019-11-06 NOTE — Telephone Encounter (Signed)
Referral placed for patient to see Dr. Alethia Berthold at [facility].

## 2019-11-25 ENCOUNTER — Other Ambulatory Visit: Payer: Self-pay

## 2019-11-26 ENCOUNTER — Other Ambulatory Visit: Payer: Self-pay

## 2019-11-26 ENCOUNTER — Telehealth: Payer: Self-pay | Admitting: *Deleted

## 2019-11-26 ENCOUNTER — Ambulatory Visit: Payer: PPO | Admitting: Cardiology

## 2019-11-26 ENCOUNTER — Encounter: Payer: Self-pay | Admitting: Cardiology

## 2019-11-26 VITALS — BP 120/70 | HR 70 | Ht 69.0 in | Wt 176.0 lb

## 2019-11-26 DIAGNOSIS — E782 Mixed hyperlipidemia: Secondary | ICD-10-CM

## 2019-11-26 DIAGNOSIS — I25118 Atherosclerotic heart disease of native coronary artery with other forms of angina pectoris: Secondary | ICD-10-CM | POA: Diagnosis not present

## 2019-11-26 DIAGNOSIS — I10 Essential (primary) hypertension: Secondary | ICD-10-CM | POA: Diagnosis not present

## 2019-11-26 MED ORDER — ISOSORBIDE MONONITRATE ER 60 MG PO TB24: 180.0000 mg | ORAL_TABLET | Freq: Every day | ORAL | 3 refills | Status: DC

## 2019-11-26 NOTE — Progress Notes (Signed)
Cardiology Office Note:    Date:  11/26/2019   ID:  Keith Amble., DOB Dec 26, 1945, MRN [id number]  PCP:  Nicoletta Dress, MD  Cardiologist:  Shirlee More, MD    Referring MD: Nicoletta Dress, MD    ASSESSMENT:    1. Coronary artery disease of native artery of native heart with stable angina pectoris ([place])   2. Essential hypertension   3. Mixed hyperlipidemia    PLAN:    In order of problems listed above:  1. Discussed with the family in this context of her decision making he really is palliation of symptoms and that both TMLR are good options with the potential of angiogenesis gives the man options for better quality of life.  She said she will discuss with her sister give me an answer 24 hours.  At this time percutaneous revascularization is not a choice the patient will not allow repeat bypass surgery and I do not think is an option for extending viable.  He is on close to maximal medical therapy I offered to increase his oral nitrates and an extra 60 mg oral might mononitrate daily. 2. Difficult to control much of his hypertension is in the setting of chest pain.  Continue current treatment on multiple antihypertensive agents including clonidine 3. Continue his statin will need a lipid profile next visit   Next appointment: 3 months   Medication Adjustments/Labs and Tests Ordered: Current medicines are reviewed at length with the patient today.  Concerns regarding medicines are outlined above.  Orders Placed This Encounter  Procedures  . EKG 12-Lead   No orders of the defined types were placed in this encounter.   Chief Complaint  Patient presents with  . Follow-up  . Coronary Artery Disease    History of Present Illness:    Keith Huttner. is a 74 y.o. adult with a hx of CAD s/p NSTEMI 11/19/18, CABG x5 11/21/18. Cardiac cath  02/03/19 by Dr. Saunders Revel. He had patent LIMA-LAD with severe stenosis just beyond anastomosis and occlusion of all SVG. He was  started in Imdur and DAPT. Echo 01/31/19 with EF 60-65% and impaired diastolic function.  At  office visit 02/12/19 he was started on Ranexa for chest pain and bedtime Clonidine for elevated evening BPs.   He was last seen 08/28/2019.  At the request of the patient and his daughter I reached out looking for a program doing TMLR spoke to Dr. Alethia Berthold at [facility] recommended evaluation there is a offer the procedure as well as an angiogenesis trial. Compliance with diet, lifestyle and medications: Yes  This is a difficult visit.  The daughter is present participated in evaluation decision making.  He is very displeased with all and even voices at times he thinks of comfort measures only. Tells me he has angina with any minor activity takes nitroglycerin frequently and is really discussing palliative care.  His family would like to see him have the option of TMLR and I gave him the choice of either going to see Dr. Veneta [facility]on at [facility] for an evaluation at [facility] with Dr. 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  Not short of breath has no edema no palpitation or syncope. Past Medical History:  Diagnosis Date  . Anxiety 06/27/2016  . Bilateral lower extremity edema 01/10/2019  . CAD (coronary artery disease)    s/p CABGX5 10/2018. LHC 02/03/2019:  patent LIMA, all SVG occluded, no target for PCI.   Marland Kitchen Chest pain 02/12/2019  . Coronary artery disease of native artery of native heart with stable angina pectoris ([place]) 12/19/2018  . DDD (degenerative disc disease), lumbar 06/27/2016  . Diabetes mellitus without complication ([place])    type 2  . Dyslexia 06/27/2016  . Essential hypertension 12/19/2018  . Hyperlipidemia 12/19/2018  . Hyperlipidemia LDL goal <70   . Hypertension   . Lumbosacral radiculopathy at L4 06/27/2016  . NSTEMI (non-ST elevated myocardial infarction) ([place]) 11/19/2018  . Osteoarthritis of  multiple joints 06/27/2016  . S/P CABG x 5 11/21/2018  . Type 2 diabetes mellitus ([place]) 12/19/2018  . Urinary frequency 01/10/2019    Past Surgical History:  Procedure Laterality Date  . APPENDECTOMY    . BACK SURGERY    . CHOLECYSTECTOMY    . CORONARY ANGIOPLASTY WITH STENT PLACEMENT  1998  . CORONARY ARTERY BYPASS GRAFT N/A 11/21/2018   Procedure: CORONARY ARTERY BYPASS GRAFTING (CABG) TIMES FIVE USING LEFT INTERNAL MAMMARY ARTERY AND LEFT SAPHENOUS VEIN HARVESTED ENDOSCOPICALLY;  Surgeon: Gaye Pollack, MD;  Location: [place];  Service: Open Heart Surgery;  Laterality: N/A;  . LEFT HEART CATH AND CORONARY ANGIOGRAPHY N/A 11/19/2018   Procedure: LEFT HEART CATH AND CORONARY ANGIOGRAPHY;  Surgeon: Troy Sine, MD;  Location: [place] CV LAB;  Service: Cardiovascular;  Laterality: N/A;  . LEFT HEART CATH AND CORS/GRAFTS ANGIOGRAPHY N/A 02/03/2019   Procedure: LEFT HEART CATH AND CORS/GRAFTS ANGIOGRAPHY;  Surgeon: Nelva Bush, MD;  Location: [place] CV LAB;  Service: Cardiovascular;  Laterality: N/A;  . TEE WITHOUT CARDIOVERSION N/A 11/21/2018   Procedure: TRANSESOPHAGEAL ECHOCARDIOGRAM (TEE);  Surgeon: Gaye Pollack, MD;  Location: [place];  Service: Open Heart Surgery;  Laterality: N/A;    Current Medications: Current Meds  Medication Sig  . ALPRAZolam (XANAX) 0.25 MG tablet Take 1 tablet (0.25 mg total) by mouth 2 (two) times daily as needed for anxiety.  Marland Kitchen amLODipine (NORVASC) 10 MG tablet Take 1 tablet (10 mg total) by mouth every other day. Alternate with HZTZ  . aspirin EC 81 MG tablet Take 81 mg by mouth daily.  . cloNIDine (CATAPRES) 0.2 MG tablet Take 1 tablet (0.2 mg total) by mouth at bedtime.  . clopidogrel (PLAVIX) 75 MG tablet Take 1 tablet (75 mg total) by mouth daily.  . ferrous sulfate (FEROSUL) 325 (65 FE) MG tablet Take 325 mg by mouth at bedtime.  . isosorbide mononitrate (IMDUR) 120 MG 24 hr tablet TAKE ONE (1) TABLET BY MOUTH ONCE DAILY  . metFORMIN  (GLUCOPHAGE-XR) 750 MG 24 hr tablet Take 750 mg by mouth 2 (two) times daily.  . metoprolol tartrate (LOPRESSOR) 25 MG tablet Take 1 tablet (25 mg total) by mouth 2 (two) times daily.  Marland Kitchen omeprazole (PRILOSEC) 20 MG capsule Take 20 mg by mouth every morning.  . ranolazine (RANEXA) 500 MG 12 hr tablet Take 1 tablet (500 mg total) by mouth daily.  . simvastatin (ZOCOR) 20 MG tablet TAKE ONE (1) TABLET ONCE DAILY AT 6PM     Allergies:   Lipitor [atorvastatin calcium] and Penicillins   Social History   Socioeconomic History  . Marital status: Married    Spouse name: Not on file  . Number of children: Not on file  . Years of education: Not on file  . Highest education level: Not on file  Occupational History  . Not on file  Tobacco Use  . Smoking status: Never  Smoker  . Smokeless tobacco: Former Systems developer    Types: Chew  Substance and Sexual Activity  . Alcohol use: Not Currently  . Drug use: Never  . Sexual activity: Not Currently  Other Topics Concern  . Not on file  Social History Narrative  . Not on file   Social Determinants of Health   Financial Resource Strain:   . Difficulty of Paying Living Expenses:   Food Insecurity:   . Worried About Charity fundraiser in the Last Year:   . Arboriculturist in the Last Year:   Transportation Needs:   . Film/video editor (Medical):   Marland Kitchen Lack of Transportation (Non-Medical):   Physical Activity:   . Days of Exercise per Week:   . Minutes of Exercise per Session:   Stress:   . Feeling of Stress :   Social Connections:   . Frequency of Communication with Friends and Family:   . Frequency of Social Gatherings with Friends and Family:   . Attends Religious Services:   . Active Member of Clubs or Organizations:   . Attends Archivist Meetings:   Marland Kitchen Marital Status:      Family History: The patient's family history includes Cancer in his sister; Diabetes in his sister; Heart attack in his mother and sister; Stroke in his  mother and sister. ROS:   Please see the history of present illness.    All other systems reviewed and are negative.  EKGs/Labs/Other Studies Reviewed:    The following studies were reviewed today:  EKG:  EKG ordered today and personally reviewed.  The ekg ordered today demonstrates sinus rhythm left axis deviation inferior MI and diffuse T wave inversions.  An EKG showed T wave inversions this is a little bit more prominent.  Recent Labs: 01/30/2019: ALT 25 02/04/2019: BUN 18; Creatinine, Ser 0.93; Hemoglobin 13.5; Platelets 120; Potassium 4.3; Sodium 138  Recent Lipid Panel    Component Value Date/Time   CHOL 88 01/31/2019 0637   CHOL 93 (L) 01/30/2019 1448   TRIG 139 01/31/2019 0637   HDL 26 (L) 01/31/2019 0637   HDL 30 (L) 01/30/2019 1448   CHOLHDL 3.4 01/31/2019 0637   VLDL 28 01/31/2019 0637   LDLCALC 34 01/31/2019 0637   LDLCALC 35 01/30/2019 1448    Physical Exam:    VS:  BP 120/70 (BP Location: Right Arm, Patient Position: Sitting, Cuff Size: Normal)   Pulse 70   Ht 5\' 9"  (1.753 m)   Wt 176 lb (79.8 kg)   SpO2 97%   BMI 25.99 kg/m     Wt Readings from Last 3 Encounters:  11/26/19 176 lb (79.8 kg)  08/28/19 172 lb (78 kg)  05/07/19 173 lb 6.4 oz (78.7 kg)     GEN:  Well nourished, well developed in no acute distress.  Frail appearing HEENT: Normal NECK: No JVD; No carotid bruits LYMPHATICS: No lymphadenopathy CARDIAC: RRR, no murmurs, rubs, gallops RESPIRATORY:  Clear to auscultation without rales, wheezing or rhonchi  ABDOMEN: Soft, non-tender, non-distended MUSCULOSKELETAL:  No edema; No deformity  SKIN: Warm and dry NEUROLOGIC:  Alert and oriented x 3 PSYCHIATRIC:  Normal affect    Signed, Shirlee More, MD  11/26/2019 11:20 AM    [place]

## 2019-11-26 NOTE — Patient Instructions (Addendum)
Medication Instructions:  Your physician has recommended you make the following change in your medication:  INCREASE: Imdur 180 mg take 3 tablets by mouth daily.  *If you need a refill on your cardiac medications before your next appointment, please call your pharmacy*   Lab Work: None  If you have labs (blood work) drawn today and your tests are completely normal, you will receive your results only by: Marland Kitchen MyChart Message (if you have MyChart) OR . A paper copy in the mail If you have any lab test that is abnormal or we need to change your treatment, we will call you to review the results.   Testing/Procedures: None   Follow-Up: At [facility], you and your health needs are our priority.  As part of our continuing mission to provide you with exceptional heart care, we have created designated Provider Care Teams.  These Care Teams include your primary Cardiologist (physician) and Advanced Practice Providers (APPs -  Physician Assistants and Nurse Practitioners) who all work together to provide you with the care you need, when you need it.  We recommend signing up for the patient portal called "MyChart".  Sign up information is provided on this After Visit Summary.  MyChart is used to connect with patients for Virtual Visits (Telemedicine).  Patients are able to view lab/test results, encounter notes, upcoming appointments, etc.  Non-urgent messages can be sent to your provider as well.   To learn more about what you can do with MyChart, go to [email or web address].    Your next appointment:   3 month(s)  The format for your next appointment:   In Person  Provider:   Shirlee More, MD   Other Instructions

## 2019-11-26 NOTE — Telephone Encounter (Signed)
Faxed referral to Dr. Tawanna Sat office in [place] at [place] per Pt's daughter's request. Put referral in system but they have never received it. Phone [phone] and fax [phone]

## 2019-11-27 ENCOUNTER — Telehealth: Payer: Self-pay | Admitting: *Deleted

## 2019-11-27 NOTE — Telephone Encounter (Signed)
Sent most recent office note to [place] at [facility] (surgeon) per her request.

## 2019-12-30 ENCOUNTER — Other Ambulatory Visit: Payer: Self-pay | Admitting: Cardiology

## 2020-01-01 DIAGNOSIS — I25110 Atherosclerotic heart disease of native coronary artery with unstable angina pectoris: Secondary | ICD-10-CM | POA: Insufficient documentation

## 2020-01-01 HISTORY — DX: Atherosclerotic heart disease of native coronary artery with unstable angina pectoris: I25.110

## 2020-01-12 DIAGNOSIS — R0602 Shortness of breath: Secondary | ICD-10-CM | POA: Diagnosis not present

## 2020-01-12 DIAGNOSIS — I10 Essential (primary) hypertension: Secondary | ICD-10-CM | POA: Diagnosis not present

## 2020-01-12 DIAGNOSIS — E038 Other specified hypothyroidism: Secondary | ICD-10-CM | POA: Diagnosis not present

## 2020-01-12 DIAGNOSIS — E782 Mixed hyperlipidemia: Secondary | ICD-10-CM | POA: Diagnosis not present

## 2020-01-12 DIAGNOSIS — Z79899 Other long term (current) drug therapy: Secondary | ICD-10-CM | POA: Diagnosis not present

## 2020-01-12 DIAGNOSIS — D518 Other vitamin B12 deficiency anemias: Secondary | ICD-10-CM | POA: Diagnosis not present

## 2020-01-12 DIAGNOSIS — Z1211 Encounter for screening for malignant neoplasm of colon: Secondary | ICD-10-CM | POA: Diagnosis not present

## 2020-01-12 DIAGNOSIS — E559 Vitamin D deficiency, unspecified: Secondary | ICD-10-CM | POA: Diagnosis not present

## 2020-01-12 DIAGNOSIS — Z0000 Encounter for general adult medical examination without abnormal findings: Secondary | ICD-10-CM | POA: Diagnosis not present

## 2020-01-12 DIAGNOSIS — M545 Low back pain: Secondary | ICD-10-CM | POA: Diagnosis not present

## 2020-01-12 DIAGNOSIS — E1165 Type 2 diabetes mellitus with hyperglycemia: Secondary | ICD-10-CM | POA: Diagnosis not present

## 2020-01-30 ENCOUNTER — Other Ambulatory Visit: Payer: Self-pay | Admitting: Cardiology

## 2020-02-03 ENCOUNTER — Other Ambulatory Visit: Payer: Self-pay

## 2020-02-03 DIAGNOSIS — M545 Low back pain: Secondary | ICD-10-CM | POA: Diagnosis not present

## 2020-02-03 DIAGNOSIS — M5416 Radiculopathy, lumbar region: Secondary | ICD-10-CM | POA: Diagnosis not present

## 2020-02-03 DIAGNOSIS — M5127 Other intervertebral disc displacement, lumbosacral region: Secondary | ICD-10-CM | POA: Diagnosis not present

## 2020-02-03 DIAGNOSIS — M4326 Fusion of spine, lumbar region: Secondary | ICD-10-CM | POA: Diagnosis not present

## 2020-02-03 DIAGNOSIS — M48061 Spinal stenosis, lumbar region without neurogenic claudication: Secondary | ICD-10-CM | POA: Diagnosis not present

## 2020-02-03 MED ORDER — RANOLAZINE ER 500 MG PO TB12: 500.0000 mg | ORAL_TABLET | Freq: Every day | ORAL | 11 refills | Status: DC

## 2020-02-03 NOTE — Telephone Encounter (Signed)
This is a Technical sales engineer pt, Dr. Joya Gaskins

## 2020-02-16 ENCOUNTER — Telehealth: Payer: Self-pay | Admitting: Cardiology

## 2020-02-16 ENCOUNTER — Other Ambulatory Visit: Payer: Self-pay | Admitting: Cardiology

## 2020-02-16 MED ORDER — RANOLAZINE ER 500 MG PO TB12: 500.0000 mg | ORAL_TABLET | Freq: Two times a day (BID) | ORAL | 11 refills | Status: DC

## 2020-02-16 NOTE — Telephone Encounter (Signed)
Attempted to contact Pam, patients daughter multiple times. Jeannene Patella calls back and once the operator attempts to place her on hold in order to transfer to me, she hangs up. Left another message to call back if any further questions but a call back was not necessary  Sent the requested refill for Ranexa per Dr. Daisy Lazar note.

## 2020-02-16 NOTE — Telephone Encounter (Signed)
If taking twice daily refill as this

## 2020-02-16 NOTE — Telephone Encounter (Signed)
Keith Lucero is returning Keith Lucero's call.

## 2020-02-16 NOTE — Addendum Note (Signed)
Addended by: Cheral Marker R on: 02/16/2020 04:38 PM   Modules accepted: Orders

## 2020-02-16 NOTE — Telephone Encounter (Signed)
LMTCB regarding patients Ranexa.

## 2020-02-16 NOTE — Telephone Encounter (Signed)
Pt c/o medication issue:  1. Name of Medication: ranolazine (RANEXA) 500 MG 12 hr tablet  2. How are you currently taking this medication (dosage and times per day)? Pt has been taking this medication 2x daily.   3. Are you having a reaction (difficulty breathing--STAT)? No   4. What is your medication issue? Pt is out of medication Daughter does not know when the medications got switched to once daily. Please advise

## 2020-02-18 ENCOUNTER — Other Ambulatory Visit: Payer: Self-pay | Admitting: Cardiology

## 2020-02-18 ENCOUNTER — Telehealth: Payer: Self-pay | Admitting: *Deleted

## 2020-02-18 MED ORDER — AMLODIPINE BESYLATE 10 MG PO TABS: 10.0000 mg | ORAL_TABLET | ORAL | 2 refills | Status: DC

## 2020-02-18 MED ORDER — RANOLAZINE ER 500 MG PO TB12: 500.0000 mg | ORAL_TABLET | Freq: Two times a day (BID) | ORAL | 11 refills | Status: DC

## 2020-02-18 NOTE — Telephone Encounter (Signed)
Rx refill sent to pharmacy.  Sent for #60 this time instead of #30, is a bid drug

## 2020-02-24 ENCOUNTER — Ambulatory Visit: Payer: PPO | Admitting: Cardiology

## 2020-03-15 ENCOUNTER — Other Ambulatory Visit: Payer: Self-pay | Admitting: Cardiology

## 2020-03-29 DIAGNOSIS — D518 Other vitamin B12 deficiency anemias: Secondary | ICD-10-CM | POA: Diagnosis not present

## 2020-03-29 DIAGNOSIS — N401 Enlarged prostate with lower urinary tract symptoms: Secondary | ICD-10-CM | POA: Diagnosis not present

## 2020-03-29 DIAGNOSIS — I10 Essential (primary) hypertension: Secondary | ICD-10-CM | POA: Diagnosis not present

## 2020-03-29 DIAGNOSIS — M150 Primary generalized (osteo)arthritis: Secondary | ICD-10-CM | POA: Diagnosis not present

## 2020-03-29 DIAGNOSIS — E1165 Type 2 diabetes mellitus with hyperglycemia: Secondary | ICD-10-CM | POA: Diagnosis not present

## 2020-03-29 DIAGNOSIS — E559 Vitamin D deficiency, unspecified: Secondary | ICD-10-CM | POA: Diagnosis not present

## 2020-03-29 DIAGNOSIS — E782 Mixed hyperlipidemia: Secondary | ICD-10-CM | POA: Diagnosis not present

## 2020-03-29 DIAGNOSIS — E038 Other specified hypothyroidism: Secondary | ICD-10-CM | POA: Diagnosis not present

## 2020-04-14 DIAGNOSIS — I10 Essential (primary) hypertension: Secondary | ICD-10-CM | POA: Insufficient documentation

## 2020-04-14 DIAGNOSIS — E119 Type 2 diabetes mellitus without complications: Secondary | ICD-10-CM | POA: Insufficient documentation

## 2020-04-14 DIAGNOSIS — I2510 Atherosclerotic heart disease of native coronary artery without angina pectoris: Secondary | ICD-10-CM | POA: Insufficient documentation

## 2020-04-15 ENCOUNTER — Other Ambulatory Visit: Payer: Self-pay

## 2020-04-15 ENCOUNTER — Ambulatory Visit: Payer: PPO | Admitting: Cardiology

## 2020-04-15 ENCOUNTER — Encounter: Payer: Self-pay | Admitting: Cardiology

## 2020-04-15 VITALS — BP 169/78 | HR 45 | Ht 69.0 in | Wt 177.8 lb

## 2020-04-15 DIAGNOSIS — I25118 Atherosclerotic heart disease of native coronary artery with other forms of angina pectoris: Secondary | ICD-10-CM

## 2020-04-15 DIAGNOSIS — Z951 Presence of aortocoronary bypass graft: Secondary | ICD-10-CM | POA: Diagnosis not present

## 2020-04-15 DIAGNOSIS — E782 Mixed hyperlipidemia: Secondary | ICD-10-CM | POA: Diagnosis not present

## 2020-04-15 DIAGNOSIS — I10 Essential (primary) hypertension: Secondary | ICD-10-CM | POA: Diagnosis not present

## 2020-04-15 MED ORDER — SIMVASTATIN 20 MG PO TABS: ORAL_TABLET | ORAL | 3 refills | Status: DC

## 2020-04-15 MED ORDER — RANOLAZINE ER 500 MG PO TB12: 500.0000 mg | ORAL_TABLET | Freq: Every day | ORAL | 3 refills | Status: DC

## 2020-04-15 MED ORDER — ASPIRIN EC 81 MG PO TBEC: 81.0000 mg | DELAYED_RELEASE_TABLET | Freq: Every day | ORAL | 3 refills | Status: AC

## 2020-04-15 MED ORDER — CLONIDINE HCL 0.2 MG PO TABS
0.2000 mg | ORAL_TABLET | Freq: Every day | ORAL | 3 refills | Status: DC
Start: 2020-04-15 — End: 2020-10-29

## 2020-04-15 MED ORDER — METOPROLOL TARTRATE 25 MG PO TABS: ORAL_TABLET | ORAL | 3 refills | Status: DC

## 2020-04-15 MED ORDER — AMLODIPINE BESYLATE 10 MG PO TABS
10.0000 mg | ORAL_TABLET | ORAL | 3 refills | Status: DC
Start: 2020-04-15 — End: 2020-10-29

## 2020-04-15 MED ORDER — ISOSORBIDE MONONITRATE ER 60 MG PO TB24
180.0000 mg | ORAL_TABLET | Freq: Every day | ORAL | 3 refills | Status: DC
Start: 2020-04-15 — End: 2020-04-19

## 2020-04-15 NOTE — Progress Notes (Signed)
Cardiology Office Note:    Date:  04/15/2020   ID:  Keith Lucero., DOB 23-Apr-1946, MRN [id number]  PCP:  Bonnita Nasuti, MD  Cardiologist:  Shirlee More, MD    Referring MD: Nicoletta Dress, MD    ASSESSMENT:    1. Coronary artery disease of native artery of native heart with stable angina pectoris ([place])   2. S/P CABG x 5   3. Essential hypertension   4. Mixed hyperlipidemia    PLAN:    In order of problems listed above:  1. In general he is improved but he is not well predominant symptoms of fatigue are cardiac and related to graft occlusion.  He is on a good maximal medical regimen and will continue aspirin calcium channel blocker beta-blocker oral nitrates and ranolazine will decrease it to once daily as he is adamant as the cause of the foul taste in his mouth.  I encouraged him to either go to [facility] or go to [facility] and receive additional evaluation and treatment 2. Stable continue clonidine he will not except an additional antihypertensive agent to achieve target 3. Continue statin check liver function lipid profile   Next appointment: Next months   Medication Adjustments/Labs and Tests Ordered: Current medicines are reviewed at length with the patient today.  Concerns regarding medicines are outlined above.  Orders Placed This Encounter  Procedures  . EKG 12-Lead   No orders of the defined types were placed in this encounter.   Chief complaint follow-up for CAD  History of Present Illness:    Keith Lucero. is a 74 y.o. adult with a hx of CAD s/p NSTEMI 11/19/18, CABG x5 11/21/18. Cardiac cath  02/03/19 by Dr. Saunders Revel. He had patent LIMA-LAD with severe stenosis just beyond anastomosis and occlusion of all SVG. He was started in Imdur and DAPT. Echo 01/31/19 with EF 60-65% and impaired diastolic function.  At  office visit 02/12/19 he was started on Ranexa for chest pain and bedtime Clonidine for elevated evening BPs.    He was last seen 11/26/2019.  The  request of the patient and his daughter I reached out to Dr.Ohman at [facility] and recommended evaluation for an angiogenesis trial.  Unfortunately the patient declined. Compliance with diet, lifestyle and medications: Yes  His daughter is present participate in decision making and evaluation.  Is a difficult patient visit there is a lot of things going on from a cardiac perspective he is not having angina shortness of breath is complains that at times he feels very weak.  Blood pressure at home persistently is greater than 150 but he does not want to take any additional antihypertensive meds as heart rates are predominantly 50 to 60 bpm although he is sinus bradycardia today.  He is very focused on foul taste in his mouth and a rash in the left arm and his blood sugars are persistently elevated and he rarely takes a tablet of Glucophage Exar he will take it daily and negotiated with him to take it 2 days/week.  I reinforced with him that much of his symptomatology of fatigue is graft occlusion I have made arrangements for her to go to [facility] for angiogenesis trial they have declined they want to go to [place] for consideration of laser catheter revascularization but have delayed because of back pain. Past Medical History:  Diagnosis Date  . Anxiety 06/27/2016  . Bilateral lower extremity edema 01/10/2019  . CAD (coronary artery disease)    s/p  CABGX5 10/2018. LHC 02/03/2019: patent LIMA, all SVG occluded, no target for PCI.   Marland Kitchen Chest pain 02/12/2019  . Coronary artery disease of native artery of native heart with stable angina pectoris ([place]) 12/19/2018  . DDD (degenerative disc disease), lumbar 06/27/2016  . Diabetes mellitus without complication ([place])    type 2  . Dyslexia 06/27/2016  . Essential hypertension 12/19/2018  . Hyperlipidemia 12/19/2018  . Hyperlipidemia LDL goal <70   . Hypertension   . Lumbosacral radiculopathy at L4 06/27/2016  . NSTEMI (non-ST elevated myocardial infarction) ([place])  11/19/2018  . Osteoarthritis of multiple joints 06/27/2016  . S/P CABG x 5 11/21/2018  . Type 2 diabetes mellitus ([place]) 12/19/2018  . Urinary frequency 01/10/2019    Past Surgical History:  Procedure Laterality Date  . APPENDECTOMY    . BACK SURGERY    . CHOLECYSTECTOMY    . CORONARY ANGIOPLASTY WITH STENT PLACEMENT  1998  . CORONARY ARTERY BYPASS GRAFT N/A 11/21/2018   Procedure: CORONARY ARTERY BYPASS GRAFTING (CABG) TIMES FIVE USING LEFT INTERNAL MAMMARY ARTERY AND LEFT SAPHENOUS VEIN HARVESTED ENDOSCOPICALLY;  Surgeon: Gaye Pollack, MD;  Location: [place];  Service: Open Heart Surgery;  Laterality: N/A;  . LEFT HEART CATH AND CORONARY ANGIOGRAPHY N/A 11/19/2018   Procedure: LEFT HEART CATH AND CORONARY ANGIOGRAPHY;  Surgeon: Troy Sine, MD;  Location: [place] CV LAB;  Service: Cardiovascular;  Laterality: N/A;  . LEFT HEART CATH AND CORS/GRAFTS ANGIOGRAPHY N/A 02/03/2019   Procedure: LEFT HEART CATH AND CORS/GRAFTS ANGIOGRAPHY;  Surgeon: Nelva Bush, MD;  Location: [place] CV LAB;  Service: Cardiovascular;  Laterality: N/A;  . TEE WITHOUT CARDIOVERSION N/A 11/21/2018   Procedure: TRANSESOPHAGEAL ECHOCARDIOGRAM (TEE);  Surgeon: Gaye Pollack, MD;  Location: [place];  Service: Open Heart Surgery;  Laterality: N/A;    Current Medications: Current Meds  Medication Sig  . amLODipine (NORVASC) 10 MG tablet Take 1 tablet (10 mg total) by mouth every other day. Alternate with HZTZ  . aspirin EC 81 MG tablet Take 81 mg by mouth daily.  . cloNIDine (CATAPRES) 0.2 MG tablet TAKE ONE TABLET DAILY AT BEDTIME  . diazepam (VALIUM) 2 MG tablet Take 2 mg by mouth daily as needed.  . ferrous sulfate (FEROSUL) 325 (65 FE) MG tablet Take 325 mg by mouth at bedtime.  Marland Kitchen HYDROcodone-acetaminophen (NORCO) 10-325 MG tablet Take 1 tablet by mouth 2 (two) times daily as needed.  . isosorbide mononitrate (IMDUR) 60 MG 24 hr tablet Take 3 tablets (180 mg total) by mouth daily.  . metoprolol  tartrate (LOPRESSOR) 25 MG tablet TAKE ONE (1) TABLET BY MOUTH TWO (2) TIMES DAILY  . nitroGLYCERIN (NITROSTAT) 0.4 MG SL tablet Place 1 tablet (0.4 mg total) under the tongue every 5 (five) minutes as needed for chest pain.  Marland Kitchen omeprazole (PRILOSEC) 20 MG capsule Take 20 mg by mouth every morning.  . ranolazine (RANEXA) 500 MG 12 hr tablet Take 1 tablet (500 mg total) by mouth 2 (two) times daily.  . simvastatin (ZOCOR) 20 MG tablet TAKE ONE (1) TABLET ONCE DAILY AT 6PM     Allergies:   Lipitor [atorvastatin calcium] and Penicillins   Social History   Socioeconomic History  . Marital status: Married    Spouse name: Not on file  . Number of children: Not on file  . Years of education: Not on file  . Highest education level: Not on file  Occupational History  . Not on file  Tobacco Use  .  Smoking status: Never Smoker  . Smokeless tobacco: Former Systems developer    Types: Secondary school teacher  . Vaping Use: Never used  Substance and Sexual Activity  . Alcohol use: Not Currently  . Drug use: Never  . Sexual activity: Not Currently  Other Topics Concern  . Not on file  Social History Narrative  . Not on file   Social Determinants of Health   Financial Resource Strain:   . Difficulty of Paying Living Expenses: Not on file  Food Insecurity:   . Worried About Charity fundraiser in the Last Year: Not on file  . Ran Out of Food in the Last Year: Not on file  Transportation Needs:   . Lack of Transportation (Medical): Not on file  . Lack of Transportation (Non-Medical): Not on file  Physical Activity:   . Days of Exercise per Week: Not on file  . Minutes of Exercise per Session: Not on file  Stress:   . Feeling of Stress : Not on file  Social Connections:   . Frequency of Communication with Friends and Family: Not on file  . Frequency of Social Gatherings with Friends and Family: Not on file  . Attends Religious Services: Not on file  . Active Member of Clubs or Organizations: Not on file   . Attends Archivist Meetings: Not on file  . Marital Status: Not on file     Family History: The patient's family history includes Cancer in his sister; Diabetes in his sister; Heart attack in his mother and sister; Stroke in his mother and sister. ROS:   Please see the history of present illness.    All other systems reviewed and are negative.  EKGs/Labs/Other Studies Reviewed:    The following studies were reviewed today:  EKG:  EKG ordered today and personally reviewed.  The ekg ordered today demonstrates sinus bradycardia 45 bpm left anterior fascicular block  Recent Labs: No results found for requested labs within last 8760 hours.  Recent Lipid Panel    Component Value Date/Time   CHOL 88 01/31/2019 0637   CHOL 93 (L) 01/30/2019 1448   TRIG 139 01/31/2019 0637   HDL 26 (L) 01/31/2019 0637   HDL 30 (L) 01/30/2019 1448   CHOLHDL 3.4 01/31/2019 0637   VLDL 28 01/31/2019 0637   LDLCALC 34 01/31/2019 0637   LDLCALC 35 01/30/2019 1448    Physical Exam:    VS:  BP (!) 169/78   Pulse (!) 45   Ht 5\' 9"  (1.753 m)   Wt 177 lb 12.8 oz (80.6 kg)   SpO2 98%   BMI 26.26 kg/m     Wt Readings from Last 3 Encounters:  04/15/20 177 lb 12.8 oz (80.6 kg)  11/26/19 176 lb (79.8 kg)  08/28/19 172 lb (78 kg)     GEN: He looks improved well nourished, well developed in no acute distress HEENT: Normal NECK: No JVD; No carotid bruits LYMPHATICS: No lymphadenopathy CARDIAC: RRR, no murmurs, rubs, gallops RESPIRATORY:  Clear to auscultation without rales, wheezing or rhonchi  ABDOMEN: Soft, non-tender, non-distended MUSCULOSKELETAL:  No edema; No deformity  SKIN: Warm and dry NEUROLOGIC:  Alert and oriented x 3 PSYCHIATRIC:  Normal affect    Signed, Shirlee More, MD  04/15/2020 4:36 PM    [facility]

## 2020-04-15 NOTE — Patient Instructions (Signed)
Medication Instructions:  Your physician has recommended you make the following change in your medication:  DECREASE: Ranexa 500 mg take one tablet by mouth daily.   Please take your metformin 750 mg twice weekly.   *If you need a refill on your cardiac medications before your next appointment, please call your pharmacy*   Lab Work: Your physician recommends that you return for lab work in: TODAY CMP, CBC, Lipids, TSH If you have labs (blood work) drawn today and your tests are completely normal, you will receive your results only by: Marland Kitchen MyChart Message (if you have MyChart) OR . A paper copy in the mail If you have any lab test that is abnormal or we need to change your treatment, we will call you to review the results.   Testing/Procedures: None   Follow-Up: At [facility], you and your health needs are our priority.  As part of our continuing mission to provide you with exceptional heart care, we have created designated Provider Care Teams.  These Care Teams include your primary Cardiologist (physician) and Advanced Practice Providers (APPs -  Physician Assistants and Nurse Practitioners) who all work together to provide you with the care you need, when you need it.  We recommend signing up for the patient portal called "MyChart".  Sign up information is provided on this After Visit Summary.  MyChart is used to connect with patients for Virtual Visits (Telemedicine).  Patients are able to view lab/test results, encounter notes, upcoming appointments, etc.  Non-urgent messages can be sent to your provider as well.   To learn more about what you can do with MyChart, go to [email or web address].    Your next appointment:   6 month(s)  The format for your next appointment:   In Person  Provider:   Shirlee More, MD   Other Instructions '

## 2020-04-16 ENCOUNTER — Telehealth: Payer: Self-pay

## 2020-04-16 LAB — COMPREHENSIVE METABOLIC PANEL
ALT: 14 IU/L (ref 0–44)
AST: 15 IU/L (ref 0–40)
Albumin/Globulin Ratio: 2.1 (ref 1.2–2.2)
Albumin: 4.2 g/dL (ref 3.7–4.7)
Alkaline Phosphatase: 63 IU/L (ref 44–121)
BUN/Creatinine Ratio: 14 (ref 10–24)
BUN: 13 mg/dL (ref 8–27)
Bilirubin Total: 1.1 mg/dL (ref 0.0–1.2)
CO2: 23 mmol/L (ref 20–29)
Calcium: 8.9 mg/dL (ref 8.6–10.2)
Chloride: 103 mmol/L (ref 96–106)
Creatinine, Ser: 0.95 mg/dL (ref 0.76–1.27)
GFR calc Af Amer: 90 mL/min/{1.73_m2} (ref >59)
GFR calc non Af Amer: 77 mL/min/{1.73_m2} (ref >59)
Globulin, Total: 2 g/dL (ref 1.5–4.5)
Glucose: 124 mg/dL — ABNORMAL HIGH (ref 65–99)
Potassium: 4.2 mmol/L (ref 3.5–5.2)
Sodium: 137 mmol/L (ref 134–144)
Total Protein: 6.2 g/dL (ref 6.0–8.5)

## 2020-04-16 LAB — CBC
Hematocrit: 44.4 % (ref 37.5–51.0)
Hemoglobin: 14.6 g/dL (ref 13.0–17.7)
MCH: 30.3 pg (ref 26.6–33.0)
MCHC: 32.9 g/dL (ref 31.5–35.7)
MCV: 92 fL (ref 79–97)
Platelets: 129 10*3/uL — ABNORMAL LOW (ref 150–450)
RBC: 4.82 x10E6/uL (ref 4.14–5.80)
RDW: 12 % (ref 11.6–15.4)
WBC: 5.6 10*3/uL (ref 3.4–10.8)

## 2020-04-16 LAB — LIPID PANEL
Chol/HDL Ratio: 3.6 ratio (ref 0.0–5.0)
Cholesterol, Total: 100 mg/dL (ref 100–199)
HDL: 28 mg/dL — ABNORMAL LOW (ref >39)
LDL Chol Calc (NIH): 42 mg/dL (ref 0–99)
Triglycerides: 181 mg/dL — ABNORMAL HIGH (ref 0–149)
VLDL Cholesterol Cal: 30 mg/dL (ref 5–40)

## 2020-04-16 LAB — TSH: TSH: 3.26 u[IU]/mL (ref 0.450–4.500)

## 2020-04-16 NOTE — Telephone Encounter (Signed)
Spoke with patient regarding results and recommendation.  Patient verbalizes understanding and is agreeable to plan of care. Advised patient to call back with any issues or concerns.  

## 2020-04-16 NOTE — Telephone Encounter (Signed)
-----   Message from Richardo Priest, MD sent at 04/16/2020  8:01 AM EDT ----- Good result no changes

## 2020-04-19 ENCOUNTER — Other Ambulatory Visit: Payer: Self-pay

## 2020-04-19 MED ORDER — ISOSORBIDE MONONITRATE ER 60 MG PO TB24
180.0000 mg | ORAL_TABLET | Freq: Every day | ORAL | 3 refills | Status: DC
Start: 2020-04-19 — End: 2020-10-29

## 2020-04-20 DIAGNOSIS — E782 Mixed hyperlipidemia: Secondary | ICD-10-CM | POA: Diagnosis not present

## 2020-04-20 DIAGNOSIS — E038 Other specified hypothyroidism: Secondary | ICD-10-CM | POA: Diagnosis not present

## 2020-04-20 DIAGNOSIS — N401 Enlarged prostate with lower urinary tract symptoms: Secondary | ICD-10-CM | POA: Diagnosis not present

## 2020-04-20 DIAGNOSIS — E559 Vitamin D deficiency, unspecified: Secondary | ICD-10-CM | POA: Diagnosis not present

## 2020-04-20 DIAGNOSIS — D518 Other vitamin B12 deficiency anemias: Secondary | ICD-10-CM | POA: Diagnosis not present

## 2020-04-20 DIAGNOSIS — I10 Essential (primary) hypertension: Secondary | ICD-10-CM | POA: Diagnosis not present

## 2020-04-20 DIAGNOSIS — E1165 Type 2 diabetes mellitus with hyperglycemia: Secondary | ICD-10-CM | POA: Diagnosis not present

## 2020-04-20 DIAGNOSIS — M150 Primary generalized (osteo)arthritis: Secondary | ICD-10-CM | POA: Diagnosis not present

## 2020-04-22 ENCOUNTER — Other Ambulatory Visit: Payer: Self-pay | Admitting: Cardiology

## 2020-04-26 MED ORDER — CLOPIDOGREL BISULFATE 75 MG PO TABS: 75.0000 mg | ORAL_TABLET | Freq: Every day | ORAL | 3 refills | Status: DC

## 2020-08-08 DIAGNOSIS — S61213A Laceration without foreign body of left middle finger without damage to nail, initial encounter: Secondary | ICD-10-CM | POA: Diagnosis not present

## 2020-09-22 IMAGING — DX CHEST - 2 VIEW
2 series · 2 of 2 positions shown · non-contrast
Comparison: 11/24/2018.

CLINICAL DATA: Prior CABG.

EXAM:
CHEST - 2 VIEW

[dg chest 2 view (1 of 2)]
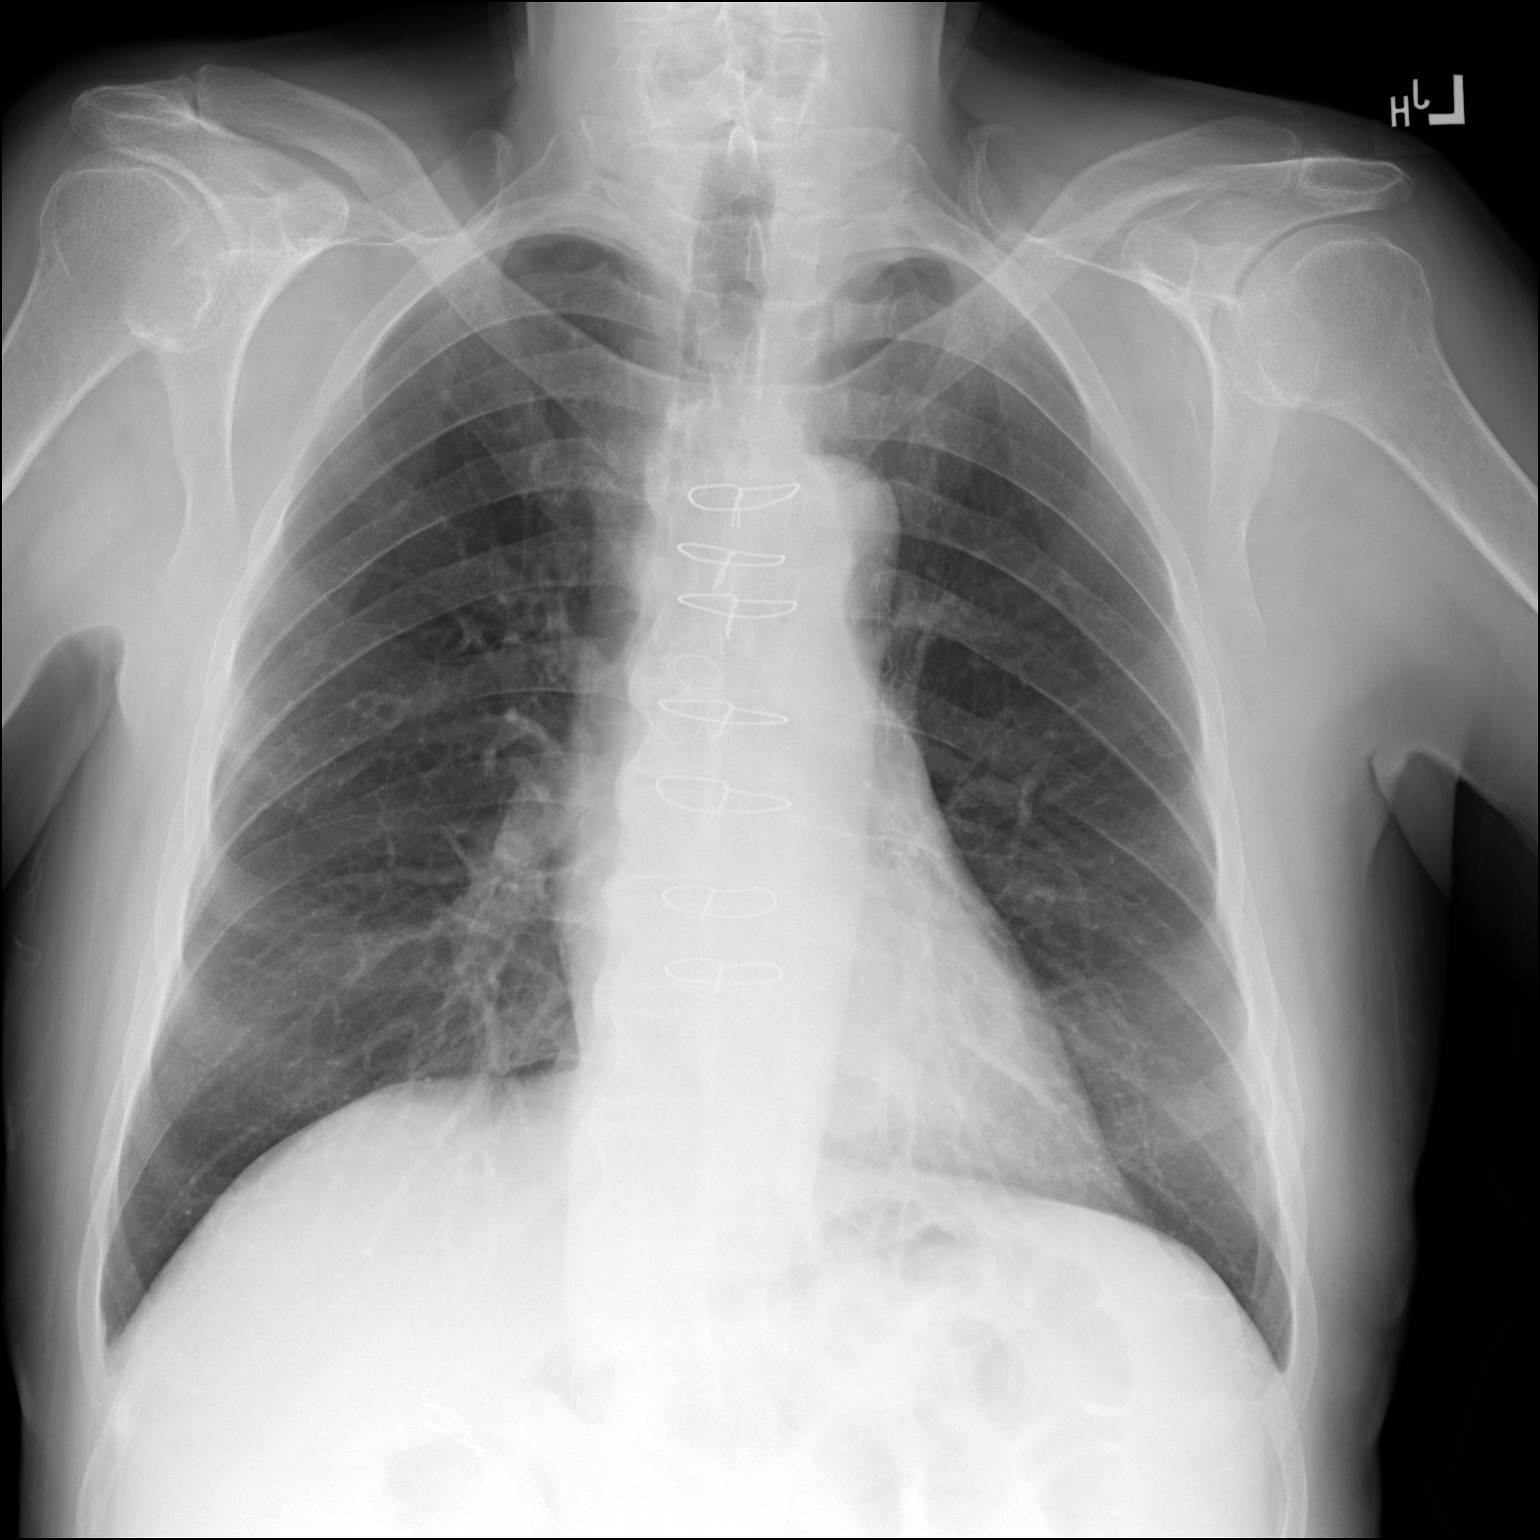

[dg chest 2 view (2 of 2)]
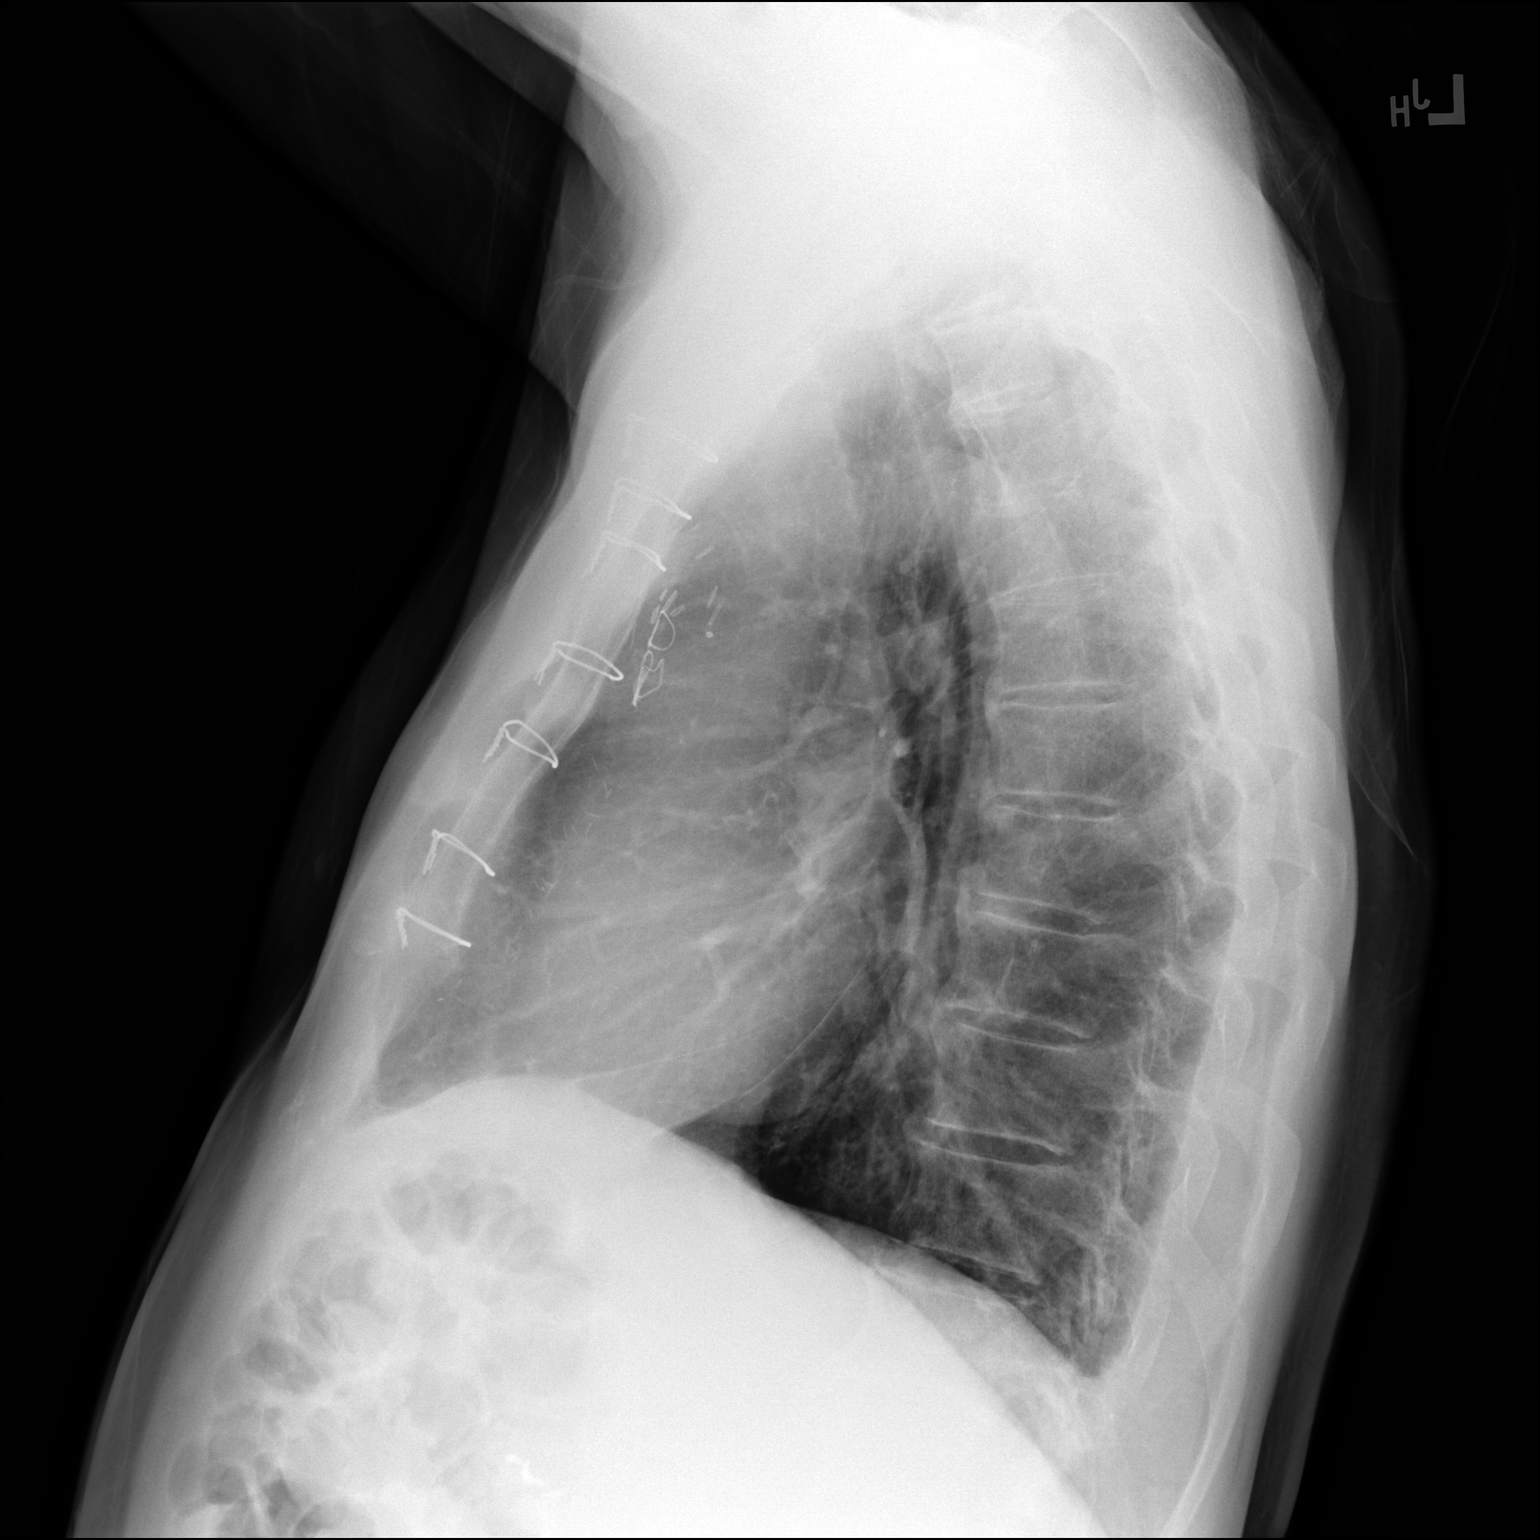

[2 of 2 positions shown; findings below may reference images not displayed]

FINDINGS: Prior CABG. Heart size normal. No focal infiltrate. Interim clearing
of left base infiltrate/edema. Interim clearing of left pleural
effusion with minimal residual. Pneumothorax. Degenerative change
thoracic spine.
IMPRESSION: 1.  Prior CABG.  Heart size normal.

2. Interim clearing of left base infiltrate/edema. Interim near
complete clearing of tiny left pleural effusion with minimal
residual.

## 2020-10-14 ENCOUNTER — Ambulatory Visit: Payer: PPO | Admitting: Cardiology

## 2020-10-29 ENCOUNTER — Other Ambulatory Visit: Payer: Self-pay

## 2020-10-29 ENCOUNTER — Ambulatory Visit: Payer: PPO | Admitting: Cardiology

## 2020-10-29 ENCOUNTER — Encounter: Payer: Self-pay | Admitting: Cardiology

## 2020-10-29 VITALS — BP 170/80 | HR 66 | Ht 69.0 in | Wt 178.0 lb

## 2020-10-29 MED ORDER — SIMVASTATIN 20 MG PO TABS
20.0000 mg | ORAL_TABLET | Freq: Every day | ORAL | 1 refills | Status: DC
Start: 2020-10-29 — End: 2021-05-02

## 2020-10-29 MED ORDER — CLONIDINE HCL 0.2 MG PO TABS: 0.2000 mg | ORAL_TABLET | Freq: Every day | ORAL | 1 refills | Status: DC

## 2020-10-29 MED ORDER — RANOLAZINE ER 500 MG PO TB12
500.0000 mg | ORAL_TABLET | Freq: Every day | ORAL | 1 refills | Status: DC
Start: 2020-10-29 — End: 2021-05-02

## 2020-10-29 MED ORDER — METOPROLOL TARTRATE 25 MG PO TABS: 25.0000 mg | ORAL_TABLET | Freq: Two times a day (BID) | ORAL | 1 refills | Status: DC

## 2020-10-29 MED ORDER — CLOPIDOGREL BISULFATE 75 MG PO TABS
75.0000 mg | ORAL_TABLET | Freq: Every day | ORAL | 1 refills | Status: DC
Start: 2020-10-29 — End: 2021-05-02

## 2020-10-29 MED ORDER — AMLODIPINE BESYLATE 10 MG PO TABS: 10.0000 mg | ORAL_TABLET | ORAL | 1 refills | Status: DC

## 2020-10-29 MED ORDER — ISOSORBIDE MONONITRATE ER 60 MG PO TB24
180.0000 mg | ORAL_TABLET | Freq: Every day | ORAL | 1 refills | Status: DC
Start: 2020-10-29 — End: 2021-11-14

## 2020-10-29 NOTE — Progress Notes (Unsigned)
Pt told Dr. Geraldo Pitter that he needs to see Dr. Bettina Gavia. Pt is having no complaints and only needs medications refilled. Refills sent in for 90 days with no refills until follow up can be arranged.

## 2020-10-29 NOTE — Progress Notes (Unsigned)
Cardiology Office Note:    Date:  10/29/2020   ID:  Keith Amble., DOB 1945/08/20, MRN [id number]  PCP:  Bonnita Nasuti, MD  Cardiologist:  Jenean Lindau, MD   Referring MD: Bonnita Nasuti, MD    ASSESSMENT:    1. Coronary artery disease of native artery of native heart with stable angina pectoris ([place])   2. Essential hypertension   3. Mixed hyperlipidemia   4. S/P CABG x 5    PLAN:    In order of problems listed above:  1. ***   Medication Adjustments/Labs and Tests Ordered: Current medicines are reviewed at length with the patient today.  Concerns regarding medicines are outlined above.  No orders of the defined types were placed in this encounter.  No orders of the defined types were placed in this encounter.    No chief complaint on file.    History of Present Illness:    Keith Chohan. is a 75 y.o. adult ***  Past Medical History:  Diagnosis Date  . Anxiety 06/27/2016  . Bilateral lower extremity edema 01/10/2019  . CAD (coronary artery disease)    s/p CABGX5 10/2018. LHC 02/03/2019: patent LIMA, all SVG occluded, no target for PCI.   Marland Kitchen Chest pain 02/12/2019  . Coronary artery disease of native artery of native heart with stable angina pectoris ([place]) 12/19/2018  . DDD (degenerative disc disease), lumbar 06/27/2016  . Diabetes mellitus without complication ([place])    type 2  . Dyslexia 06/27/2016  . Essential hypertension 12/19/2018  . Hyperlipidemia 12/19/2018  . Hyperlipidemia LDL goal <70   . Hypertension   . Lumbosacral radiculopathy at L4 06/27/2016  . NSTEMI (non-ST elevated myocardial infarction) ([place]) 11/19/2018  . Osteoarthritis of multiple joints 06/27/2016  . S/P CABG x 5 11/21/2018  . Type 2 diabetes mellitus ([place]) 12/19/2018  . Urinary frequency 01/10/2019    Past Surgical History:  Procedure Laterality Date  . APPENDECTOMY    . BACK SURGERY    . CHOLECYSTECTOMY    . CORONARY ANGIOPLASTY WITH STENT PLACEMENT  1998  . CORONARY  ARTERY BYPASS GRAFT N/A 11/21/2018   Procedure: CORONARY ARTERY BYPASS GRAFTING (CABG) TIMES FIVE USING LEFT INTERNAL MAMMARY ARTERY AND LEFT SAPHENOUS VEIN HARVESTED ENDOSCOPICALLY;  Surgeon: Gaye Pollack, MD;  Location: [place];  Service: Open Heart Surgery;  Laterality: N/A;  . LEFT HEART CATH AND CORONARY ANGIOGRAPHY N/A 11/19/2018   Procedure: LEFT HEART CATH AND CORONARY ANGIOGRAPHY;  Surgeon: Troy Sine, MD;  Location: [place] [facility];  Service: Cardiovascular;  Laterality: N/A;  . LEFT HEART CATH AND CORS/GRAFTS ANGIOGRAPHY N/A 02/03/2019   Procedure: LEFT HEART CATH AND CORS/GRAFTS ANGIOGRAPHY;  Surgeon: Nelva Bush, MD;  Location: [place] [facility];  Service: Cardiovascular;  Laterality: N/A;  . TEE WITHOUT CARDIOVERSION N/A 11/21/2018   Procedure: TRANSESOPHAGEAL ECHOCARDIOGRAM (TEE);  Surgeon: Gaye Pollack, MD;  Location: [place];  Service: Open Heart Surgery;  Laterality: N/A;    Current Medications: Current Meds  Medication Sig  . amLODipine (NORVASC) 10 MG tablet Take 1 tablet (10 mg total) by mouth every other day. Alternate with HZTZ  . aspirin EC 81 MG tablet Take 1 tablet (81 mg total) by mouth daily.  . cloNIDine (CATAPRES) 0.2 MG tablet Take 1 tablet (0.2 mg total) by mouth at bedtime.  . clopidogrel (PLAVIX) 75 MG tablet Take 1 tablet (75 mg total) by mouth daily.  . ferrous sulfate 325 (65 FE) MG  tablet Take 325 mg by mouth at bedtime.  . isosorbide mononitrate (IMDUR) 60 MG 24 hr tablet Take 3 tablets (180 mg total) by mouth daily.  . metoprolol tartrate (LOPRESSOR) 25 MG tablet Take 25 mg by mouth 2 (two) times daily.  . nitroGLYCERIN (NITROSTAT) 0.4 MG SL tablet Place 0.4 mg under the tongue every 5 (five) minutes as needed for chest pain.  Marland Kitchen omeprazole (PRILOSEC) 20 MG capsule Take 20 mg by mouth every morning.  . ranolazine (RANEXA) 500 MG 12 hr tablet Take 1 tablet (500 mg total) by mouth daily.  . simvastatin (ZOCOR) 20 MG tablet Take 20 mg by mouth  daily.     Allergies:   Lipitor [atorvastatin calcium] and Penicillins   Social History   Socioeconomic History  . Marital status: Married    Spouse name: Not on file  . Number of children: Not on file  . Years of education: Not on file  . Highest education level: Not on file  Occupational History  . Not on file  Tobacco Use  . Smoking status: Never Smoker  . Smokeless tobacco: Former Systems developer    Types: Secondary school teacher  . Vaping Use: Never used  Substance and Sexual Activity  . Alcohol use: Not Currently  . Drug use: Never  . Sexual activity: Not Currently  Other Topics Concern  . Not on file  Social History Narrative  . Not on file   Social Determinants of Health   Financial Resource Strain: Not on file  Food Insecurity: Not on file  Transportation Needs: Not on file  Physical Activity: Not on file  Stress: Not on file  Social Connections: Not on file     Family History: The patient's family history includes Cancer in his sister; Diabetes in his sister; Heart attack in his mother and sister; Stroke in his mother and sister.  ROS:   Please see the history of present illness.    All other systems reviewed and are negative.  EKGs/Labs/Other Studies Reviewed:    The following studies were reviewed today: ***   Recent Labs: 04/15/2020: ALT 14; BUN 13; Creatinine, Ser 0.95; Hemoglobin 14.6; Platelets 129; Potassium 4.2; Sodium 137; TSH 3.260  Recent Lipid Panel    Component Value Date/Time   CHOL 100 04/15/2020 1645   TRIG 181 (H) 04/15/2020 1645   HDL 28 (L) 04/15/2020 1645   CHOLHDL 3.6 04/15/2020 1645   CHOLHDL 3.4 01/31/2019 0637   VLDL 28 01/31/2019 0637   LDLCALC 42 04/15/2020 1645    Physical Exam:    VS:  BP (!) 170/80   Pulse 66   Ht 5\' 9"  (1.753 m)   Wt 178 lb (80.7 kg)   SpO2 99%   BMI 26.29 kg/m     Wt Readings from Last 3 Encounters:  10/29/20 178 lb (80.7 kg)  04/15/20 177 lb 12.8 oz (80.6 kg)  11/26/19 176 lb (79.8 kg)     GEN:  Patient is in no acute distress HEENT: Normal NECK: No JVD; No carotid bruits LYMPHATICS: No lymphadenopathy CARDIAC: Hear sounds regular, 2/6 systolic murmur at the apex. RESPIRATORY:  Clear to auscultation without rales, wheezing or rhonchi  ABDOMEN: Soft, non-tender, non-distended MUSCULOSKELETAL:  No edema; No deformity  SKIN: Warm and dry NEUROLOGIC:  Alert and oriented x 3 PSYCHIATRIC:  Normal affect   Signed, Jenean Lindau, MD  10/29/2020 3:16 PM    [place]

## 2020-11-09 DIAGNOSIS — E038 Other specified hypothyroidism: Secondary | ICD-10-CM | POA: Diagnosis not present

## 2020-11-09 DIAGNOSIS — E559 Vitamin D deficiency, unspecified: Secondary | ICD-10-CM | POA: Diagnosis not present

## 2020-11-09 DIAGNOSIS — D518 Other vitamin B12 deficiency anemias: Secondary | ICD-10-CM | POA: Diagnosis not present

## 2020-11-09 DIAGNOSIS — I10 Essential (primary) hypertension: Secondary | ICD-10-CM | POA: Diagnosis not present

## 2020-11-09 DIAGNOSIS — N401 Enlarged prostate with lower urinary tract symptoms: Secondary | ICD-10-CM | POA: Diagnosis not present

## 2020-11-09 DIAGNOSIS — E782 Mixed hyperlipidemia: Secondary | ICD-10-CM | POA: Diagnosis not present

## 2020-11-09 DIAGNOSIS — M150 Primary generalized (osteo)arthritis: Secondary | ICD-10-CM | POA: Diagnosis not present

## 2020-11-09 DIAGNOSIS — E1165 Type 2 diabetes mellitus with hyperglycemia: Secondary | ICD-10-CM | POA: Diagnosis not present

## 2020-12-08 DIAGNOSIS — E782 Mixed hyperlipidemia: Secondary | ICD-10-CM | POA: Diagnosis not present

## 2020-12-08 DIAGNOSIS — D518 Other vitamin B12 deficiency anemias: Secondary | ICD-10-CM | POA: Diagnosis not present

## 2020-12-08 DIAGNOSIS — M150 Primary generalized (osteo)arthritis: Secondary | ICD-10-CM | POA: Diagnosis not present

## 2020-12-08 DIAGNOSIS — E038 Other specified hypothyroidism: Secondary | ICD-10-CM | POA: Diagnosis not present

## 2020-12-08 DIAGNOSIS — N401 Enlarged prostate with lower urinary tract symptoms: Secondary | ICD-10-CM | POA: Diagnosis not present

## 2020-12-08 DIAGNOSIS — E559 Vitamin D deficiency, unspecified: Secondary | ICD-10-CM | POA: Diagnosis not present

## 2020-12-08 DIAGNOSIS — I10 Essential (primary) hypertension: Secondary | ICD-10-CM | POA: Diagnosis not present

## 2020-12-08 DIAGNOSIS — E1165 Type 2 diabetes mellitus with hyperglycemia: Secondary | ICD-10-CM | POA: Diagnosis not present

## 2020-12-27 NOTE — Progress Notes (Signed)
Cardiology Office Note:    Date:  12/28/2020   ID:  Keith Lucero., DOB July 21, 1946, MRN [id number]  PCP:  Bonnita Nasuti, MD  Cardiologist:  Shirlee More, MD    Referring MD: Bonnita Nasuti, MD    ASSESSMENT:    1. Coronary artery disease of native artery of native heart with stable angina pectoris ([place])   2. Essential hypertension   3. Mixed hyperlipidemia    PLAN:    In order of problems listed above:  1. From a cardiology perspective he is stable fortunately is having no angina on current medical therapy with early graft occlusion after bypass surgery he will continue aspirin clopidogrel and a regimen including beta-blocker calcium channel blocker oral nitrates and ranolazine. 2. Stable continue current treatment 3. Continue with statin check labs including liver function lipids 4. I am very concerned about his ability to self manage I have asked him if he wants visiting nurse he told me no he is going to have the grandson put his medications out for him   Next appointment: 6 months   Medication Adjustments/Labs and Tests Ordered: Current medicines are reviewed at length with the patient today.  Concerns regarding medicines are outlined above.  No orders of the defined types were placed in this encounter.  No orders of the defined types were placed in this encounter.   Chief Complaint  Patient presents with  . Follow-up  . Coronary Artery Disease    History of Present Illness:    Keith Lucero. is a 75 y.o. adult with a hx of complex heart disease with coronary artery disease CABG April 2020 with early graft occlusion of all vein grafts and treated medically.  Other problems include hypertension hyper lipidemia he was last seen 04/15/2020.  I reached out to [facility] he was a candidate for an angiogenesis trial and he declined.  Compliance with diet, lifestyle and medications: Yes  His daughter has been very actively involved in his care but she is no  longer assisting him. He cannot read or write and her grandson is putting his medications out for him. Overall he is on a plateau he is not having anginal discomfort but just fatigues easily when he tries to do physical effort outdoors and finds himself fatigued. No edema shortness of breath palpitation or syncope. He is having no muscle pain from his statin. Past Medical History:  Diagnosis Date  . Anxiety 06/27/2016  . Bilateral lower extremity edema 01/10/2019  . CAD (coronary artery disease)    s/p CABGX5 10/2018. LHC 02/03/2019: patent LIMA, all SVG occluded, no target for PCI.   Marland Kitchen Chest pain 02/12/2019  . Coronary artery disease of native artery of native heart with stable angina pectoris ([place]) 12/19/2018  . DDD (degenerative disc disease), lumbar 06/27/2016  . Diabetes mellitus without complication ([place])    type 2  . Dyslexia 06/27/2016  . Essential hypertension 12/19/2018  . Hyperlipidemia 12/19/2018  . Hyperlipidemia LDL goal <70   . Hypertension   . Lumbosacral radiculopathy at L4 06/27/2016  . NSTEMI (non-ST elevated myocardial infarction) ([place]) 11/19/2018  . Osteoarthritis of multiple joints 06/27/2016  . S/P CABG x 5 11/21/2018  . Type 2 diabetes mellitus ([place]) 12/19/2018  . Urinary frequency 01/10/2019    Past Surgical History:  Procedure Laterality Date  . APPENDECTOMY    . BACK SURGERY    . CHOLECYSTECTOMY    . CORONARY ANGIOPLASTY WITH STENT PLACEMENT  1998  . CORONARY  ARTERY BYPASS GRAFT N/A 11/21/2018   Procedure: CORONARY ARTERY BYPASS GRAFTING (CABG) TIMES FIVE USING LEFT INTERNAL MAMMARY ARTERY AND LEFT SAPHENOUS VEIN HARVESTED ENDOSCOPICALLY;  Surgeon: Gaye Pollack, MD;  Location: [place];  Service: Open Heart Surgery;  Laterality: N/A;  . LEFT HEART CATH AND CORONARY ANGIOGRAPHY N/A 11/19/2018   Procedure: LEFT HEART CATH AND CORONARY ANGIOGRAPHY;  Surgeon: Troy Sine, MD;  Location: [place] [facility];  Service: Cardiovascular;  Laterality: N/A;  . LEFT HEART  CATH AND CORS/GRAFTS ANGIOGRAPHY N/A 02/03/2019   Procedure: LEFT HEART CATH AND CORS/GRAFTS ANGIOGRAPHY;  Surgeon: Nelva Bush, MD;  Location: [place] [facility];  Service: Cardiovascular;  Laterality: N/A;  . TEE WITHOUT CARDIOVERSION N/A 11/21/2018   Procedure: TRANSESOPHAGEAL ECHOCARDIOGRAM (TEE);  Surgeon: Gaye Pollack, MD;  Location: [place];  Service: Open Heart Surgery;  Laterality: N/A;    Current Medications: Current Meds  Medication Sig  . amLODipine (NORVASC) 10 MG tablet Take 1 tablet (10 mg total) by mouth every other day. Alternate with HZTZ  . aspirin EC 81 MG tablet Take 1 tablet (81 mg total) by mouth daily.  . cloNIDine (CATAPRES) 0.2 MG tablet Take 1 tablet (0.2 mg total) by mouth at bedtime.  . clopidogrel (PLAVIX) 75 MG tablet Take 1 tablet (75 mg total) by mouth daily.  . ferrous sulfate 325 (65 FE) MG tablet Take 325 mg by mouth at bedtime.  . isosorbide mononitrate (IMDUR) 60 MG 24 hr tablet Take 3 tablets (180 mg total) by mouth daily.  . metoprolol tartrate (LOPRESSOR) 25 MG tablet Take 1 tablet (25 mg total) by mouth 2 (two) times daily.  . nitroGLYCERIN (NITROSTAT) 0.4 MG SL tablet Place 0.4 mg under the tongue every 5 (five) minutes as needed for chest pain.  Marland Kitchen omeprazole (PRILOSEC) 20 MG capsule Take 20 mg by mouth every morning.  . ranolazine (RANEXA) 500 MG 12 hr tablet Take 1 tablet (500 mg total) by mouth daily.  . simvastatin (ZOCOR) 20 MG tablet Take 1 tablet (20 mg total) by mouth daily.     Allergies:   Lipitor [atorvastatin calcium] and Penicillins   Social History   Socioeconomic History  . Marital status: Married    Spouse name: Not on file  . Number of children: Not on file  . Years of education: Not on file  . Highest education level: Not on file  Occupational History  . Not on file  Tobacco Use  . Smoking status: Never Smoker  . Smokeless tobacco: Former Systems developer    Types: Secondary school teacher  . Vaping Use: Never used  Substance and  Sexual Activity  . Alcohol use: Not Currently  . Drug use: Never  . Sexual activity: Not Currently  Other Topics Concern  . Not on file  Social History Narrative  . Not on file   Social Determinants of Health   Financial Resource Strain: Not on file  Food Insecurity: Not on file  Transportation Needs: Not on file  Physical Activity: Not on file  Stress: Not on file  Social Connections: Not on file     Family History: The patient's family history includes Cancer in his sister; Diabetes in his sister; Heart attack in his mother and sister; Stroke in his mother and sister. ROS:   Please see the history of present illness.    All other systems reviewed and are negative.  EKGs/Labs/Other Studies Reviewed:    The following studies were reviewed today:  Recent Labs: 04/15/2020: ALT 14; BUN 13; Creatinine, Ser 0.95; Hemoglobin 14.6; Platelets 129; Potassium 4.2; Sodium 137; TSH 3.260  Recent Lipid Panel    Component Value Date/Time   CHOL 100 04/15/2020 1645   TRIG 181 (H) 04/15/2020 1645   HDL 28 (L) 04/15/2020 1645   CHOLHDL 3.6 04/15/2020 1645   CHOLHDL 3.4 01/31/2019 0637   VLDL 28 01/31/2019 0637   LDLCALC 42 04/15/2020 1645    Physical Exam:    VS:  BP (!) 153/73   Pulse (!) 54   Ht 5\' 9"  (1.753 m)   Wt 177 lb 12.8 oz (80.6 kg)   SpO2 97%   BMI 26.26 kg/m     Wt Readings from Last 3 Encounters:  12/28/20 177 lb 12.8 oz (80.6 kg)  10/29/20 178 lb (80.7 kg)  04/15/20 177 lb 12.8 oz (80.6 kg)     GEN:  Well nourished, well developed in no acute distress HEENT: Normal NECK: No JVD; No carotid bruits LYMPHATICS: No lymphadenopathy CARDIAC: RRR, no murmurs, rubs, gallops RESPIRATORY:  Clear to auscultation without rales, wheezing or rhonchi  ABDOMEN: Soft, non-tender, non-distended MUSCULOSKELETAL:  No edema; No deformity  SKIN: Warm and dry NEUROLOGIC:  Alert and oriented x 3 PSYCHIATRIC:  Normal affect    Signed, Shirlee More, MD  12/28/2020 1:49 PM     [facility]

## 2020-12-28 ENCOUNTER — Ambulatory Visit (INDEPENDENT_AMBULATORY_CARE_PROVIDER_SITE_OTHER): Payer: PPO | Admitting: Cardiology

## 2020-12-28 ENCOUNTER — Other Ambulatory Visit: Payer: Self-pay

## 2020-12-28 ENCOUNTER — Encounter: Payer: Self-pay | Admitting: Cardiology

## 2020-12-28 VITALS — BP 153/73 | HR 54 | Ht 69.0 in | Wt 177.8 lb

## 2020-12-28 DIAGNOSIS — E782 Mixed hyperlipidemia: Secondary | ICD-10-CM | POA: Diagnosis not present

## 2020-12-28 DIAGNOSIS — I10 Essential (primary) hypertension: Secondary | ICD-10-CM

## 2020-12-28 DIAGNOSIS — I25118 Atherosclerotic heart disease of native coronary artery with other forms of angina pectoris: Secondary | ICD-10-CM

## 2020-12-28 NOTE — Patient Instructions (Signed)

## 2020-12-29 LAB — LIPID PANEL
Chol/HDL Ratio: 3.6 ratio (ref 0.0–5.0)
Cholesterol, Total: 94 mg/dL — ABNORMAL LOW (ref 100–199)
HDL: 26 mg/dL — ABNORMAL LOW (ref >39)
LDL Chol Calc (NIH): 28 mg/dL (ref 0–99)
Triglycerides: 261 mg/dL — ABNORMAL HIGH (ref 0–149)
VLDL Cholesterol Cal: 40 mg/dL (ref 5–40)

## 2020-12-29 LAB — COMPREHENSIVE METABOLIC PANEL
ALT: 15 IU/L (ref 0–44)
AST: 16 IU/L (ref 0–40)
Albumin/Globulin Ratio: 1.7 (ref 1.2–2.2)
Albumin: 4 g/dL (ref 3.7–4.7)
Alkaline Phosphatase: 73 IU/L (ref 44–121)
BUN/Creatinine Ratio: 16 (ref 10–24)
BUN: 14 mg/dL (ref 8–27)
Bilirubin Total: 1 mg/dL (ref 0.0–1.2)
CO2: 19 mmol/L — ABNORMAL LOW (ref 20–29)
Calcium: 8.7 mg/dL (ref 8.6–10.2)
Chloride: 106 mmol/L (ref 96–106)
Creatinine, Ser: 0.89 mg/dL (ref 0.76–1.27)
Globulin, Total: 2.4 g/dL (ref 1.5–4.5)
Glucose: 180 mg/dL — ABNORMAL HIGH (ref 65–99)
Potassium: 4.2 mmol/L (ref 3.5–5.2)
Sodium: 140 mmol/L (ref 134–144)
Total Protein: 6.4 g/dL (ref 6.0–8.5)
eGFR: 88 mL/min/{1.73_m2} (ref >59)

## 2020-12-30 ENCOUNTER — Telehealth: Payer: Self-pay

## 2020-12-30 NOTE — Telephone Encounter (Signed)
-----   Message from Richardo Priest, MD sent at 12/29/2020  7:44 AM EDT ----- Stable results no changes in treatment

## 2020-12-30 NOTE — Telephone Encounter (Signed)
Spoke with patient regarding results and recommendation.  Patient verbalizes understanding and is agreeable to plan of care. Advised patient to call back with any issues or concerns.  

## 2021-01-26 DIAGNOSIS — E559 Vitamin D deficiency, unspecified: Secondary | ICD-10-CM | POA: Diagnosis not present

## 2021-01-26 DIAGNOSIS — D518 Other vitamin B12 deficiency anemias: Secondary | ICD-10-CM | POA: Diagnosis not present

## 2021-01-26 DIAGNOSIS — E1165 Type 2 diabetes mellitus with hyperglycemia: Secondary | ICD-10-CM | POA: Diagnosis not present

## 2021-01-26 DIAGNOSIS — E038 Other specified hypothyroidism: Secondary | ICD-10-CM | POA: Diagnosis not present

## 2021-01-26 DIAGNOSIS — E782 Mixed hyperlipidemia: Secondary | ICD-10-CM | POA: Diagnosis not present

## 2021-01-26 DIAGNOSIS — I10 Essential (primary) hypertension: Secondary | ICD-10-CM | POA: Diagnosis not present

## 2021-01-26 DIAGNOSIS — M150 Primary generalized (osteo)arthritis: Secondary | ICD-10-CM | POA: Diagnosis not present

## 2021-01-26 DIAGNOSIS — N401 Enlarged prostate with lower urinary tract symptoms: Secondary | ICD-10-CM | POA: Diagnosis not present

## 2021-02-25 DIAGNOSIS — I10 Essential (primary) hypertension: Secondary | ICD-10-CM | POA: Diagnosis not present

## 2021-02-25 DIAGNOSIS — D518 Other vitamin B12 deficiency anemias: Secondary | ICD-10-CM | POA: Diagnosis not present

## 2021-02-25 DIAGNOSIS — E559 Vitamin D deficiency, unspecified: Secondary | ICD-10-CM | POA: Diagnosis not present

## 2021-02-25 DIAGNOSIS — E038 Other specified hypothyroidism: Secondary | ICD-10-CM | POA: Diagnosis not present

## 2021-02-25 DIAGNOSIS — N401 Enlarged prostate with lower urinary tract symptoms: Secondary | ICD-10-CM | POA: Diagnosis not present

## 2021-02-25 DIAGNOSIS — E1165 Type 2 diabetes mellitus with hyperglycemia: Secondary | ICD-10-CM | POA: Diagnosis not present

## 2021-02-25 DIAGNOSIS — M150 Primary generalized (osteo)arthritis: Secondary | ICD-10-CM | POA: Diagnosis not present

## 2021-02-25 DIAGNOSIS — E782 Mixed hyperlipidemia: Secondary | ICD-10-CM | POA: Diagnosis not present

## 2021-03-10 DIAGNOSIS — E1165 Type 2 diabetes mellitus with hyperglycemia: Secondary | ICD-10-CM | POA: Diagnosis not present

## 2021-03-10 DIAGNOSIS — E559 Vitamin D deficiency, unspecified: Secondary | ICD-10-CM | POA: Diagnosis not present

## 2021-03-10 DIAGNOSIS — I10 Essential (primary) hypertension: Secondary | ICD-10-CM | POA: Diagnosis not present

## 2021-03-10 DIAGNOSIS — N401 Enlarged prostate with lower urinary tract symptoms: Secondary | ICD-10-CM | POA: Diagnosis not present

## 2021-03-10 DIAGNOSIS — E782 Mixed hyperlipidemia: Secondary | ICD-10-CM | POA: Diagnosis not present

## 2021-03-10 DIAGNOSIS — D518 Other vitamin B12 deficiency anemias: Secondary | ICD-10-CM | POA: Diagnosis not present

## 2021-03-10 DIAGNOSIS — E038 Other specified hypothyroidism: Secondary | ICD-10-CM | POA: Diagnosis not present

## 2021-03-10 DIAGNOSIS — M150 Primary generalized (osteo)arthritis: Secondary | ICD-10-CM | POA: Diagnosis not present

## 2021-04-19 DIAGNOSIS — E119 Type 2 diabetes mellitus without complications: Secondary | ICD-10-CM | POA: Diagnosis not present

## 2021-04-19 DIAGNOSIS — K219 Gastro-esophageal reflux disease without esophagitis: Secondary | ICD-10-CM | POA: Insufficient documentation

## 2021-04-19 DIAGNOSIS — M5417 Radiculopathy, lumbosacral region: Secondary | ICD-10-CM | POA: Diagnosis not present

## 2021-04-19 DIAGNOSIS — I209 Angina pectoris, unspecified: Secondary | ICD-10-CM | POA: Diagnosis not present

## 2021-04-19 DIAGNOSIS — I25110 Atherosclerotic heart disease of native coronary artery with unstable angina pectoris: Secondary | ICD-10-CM | POA: Diagnosis not present

## 2021-04-19 DIAGNOSIS — M159 Polyosteoarthritis, unspecified: Secondary | ICD-10-CM | POA: Diagnosis not present

## 2021-04-19 DIAGNOSIS — Z951 Presence of aortocoronary bypass graft: Secondary | ICD-10-CM | POA: Diagnosis not present

## 2021-04-19 DIAGNOSIS — I10 Essential (primary) hypertension: Secondary | ICD-10-CM | POA: Diagnosis not present

## 2021-04-19 DIAGNOSIS — M5136 Other intervertebral disc degeneration, lumbar region: Secondary | ICD-10-CM | POA: Diagnosis not present

## 2021-04-19 DIAGNOSIS — R001 Bradycardia, unspecified: Secondary | ICD-10-CM | POA: Diagnosis not present

## 2021-04-19 DIAGNOSIS — F419 Anxiety disorder, unspecified: Secondary | ICD-10-CM | POA: Diagnosis not present

## 2021-04-19 DIAGNOSIS — I214 Non-ST elevation (NSTEMI) myocardial infarction: Secondary | ICD-10-CM | POA: Diagnosis not present

## 2021-04-19 DIAGNOSIS — R480 Dyslexia and alexia: Secondary | ICD-10-CM | POA: Diagnosis not present

## 2021-04-19 DIAGNOSIS — R9431 Abnormal electrocardiogram [ECG] [EKG]: Secondary | ICD-10-CM | POA: Diagnosis not present

## 2021-04-19 DIAGNOSIS — E782 Mixed hyperlipidemia: Secondary | ICD-10-CM | POA: Insufficient documentation

## 2021-04-19 DIAGNOSIS — Z7984 Long term (current) use of oral hypoglycemic drugs: Secondary | ICD-10-CM | POA: Diagnosis not present

## 2021-04-19 DIAGNOSIS — Z79899 Other long term (current) drug therapy: Secondary | ICD-10-CM | POA: Diagnosis not present

## 2021-04-19 DIAGNOSIS — T82898A Other specified complication of vascular prosthetic devices, implants and grafts, initial encounter: Secondary | ICD-10-CM | POA: Diagnosis not present

## 2021-04-19 DIAGNOSIS — Z7902 Long term (current) use of antithrombotics/antiplatelets: Secondary | ICD-10-CM | POA: Diagnosis not present

## 2021-04-19 DIAGNOSIS — R079 Chest pain, unspecified: Secondary | ICD-10-CM | POA: Diagnosis not present

## 2021-04-19 HISTORY — DX: Mixed hyperlipidemia: E78.2

## 2021-04-19 HISTORY — DX: Gastro-esophageal reflux disease without esophagitis: K21.9

## 2021-04-20 DIAGNOSIS — I214 Non-ST elevation (NSTEMI) myocardial infarction: Secondary | ICD-10-CM | POA: Diagnosis not present

## 2021-04-20 DIAGNOSIS — M5136 Other intervertebral disc degeneration, lumbar region: Secondary | ICD-10-CM | POA: Diagnosis not present

## 2021-04-20 DIAGNOSIS — E782 Mixed hyperlipidemia: Secondary | ICD-10-CM | POA: Diagnosis not present

## 2021-04-20 DIAGNOSIS — R9431 Abnormal electrocardiogram [ECG] [EKG]: Secondary | ICD-10-CM | POA: Diagnosis not present

## 2021-04-20 DIAGNOSIS — R001 Bradycardia, unspecified: Secondary | ICD-10-CM | POA: Diagnosis not present

## 2021-04-20 DIAGNOSIS — I2510 Atherosclerotic heart disease of native coronary artery without angina pectoris: Secondary | ICD-10-CM | POA: Diagnosis not present

## 2021-04-20 DIAGNOSIS — R079 Chest pain, unspecified: Secondary | ICD-10-CM | POA: Diagnosis not present

## 2021-04-20 DIAGNOSIS — I25110 Atherosclerotic heart disease of native coronary artery with unstable angina pectoris: Secondary | ICD-10-CM | POA: Diagnosis not present

## 2021-04-21 DIAGNOSIS — I25119 Atherosclerotic heart disease of native coronary artery with unspecified angina pectoris: Secondary | ICD-10-CM | POA: Diagnosis not present

## 2021-04-21 DIAGNOSIS — I25110 Atherosclerotic heart disease of native coronary artery with unstable angina pectoris: Secondary | ICD-10-CM | POA: Diagnosis not present

## 2021-04-21 DIAGNOSIS — R001 Bradycardia, unspecified: Secondary | ICD-10-CM | POA: Diagnosis not present

## 2021-04-21 DIAGNOSIS — R9431 Abnormal electrocardiogram [ECG] [EKG]: Secondary | ICD-10-CM | POA: Insufficient documentation

## 2021-04-21 DIAGNOSIS — I214 Non-ST elevation (NSTEMI) myocardial infarction: Secondary | ICD-10-CM | POA: Diagnosis not present

## 2021-04-21 DIAGNOSIS — R079 Chest pain, unspecified: Secondary | ICD-10-CM | POA: Diagnosis not present

## 2021-04-21 DIAGNOSIS — M5136 Other intervertebral disc degeneration, lumbar region: Secondary | ICD-10-CM | POA: Diagnosis not present

## 2021-04-21 HISTORY — DX: Abnormal electrocardiogram (ECG) (EKG): R94.31

## 2021-04-26 DIAGNOSIS — I10 Essential (primary) hypertension: Secondary | ICD-10-CM | POA: Diagnosis not present

## 2021-04-26 DIAGNOSIS — E559 Vitamin D deficiency, unspecified: Secondary | ICD-10-CM | POA: Diagnosis not present

## 2021-04-26 DIAGNOSIS — M150 Primary generalized (osteo)arthritis: Secondary | ICD-10-CM | POA: Diagnosis not present

## 2021-04-26 DIAGNOSIS — E038 Other specified hypothyroidism: Secondary | ICD-10-CM | POA: Diagnosis not present

## 2021-04-26 DIAGNOSIS — N401 Enlarged prostate with lower urinary tract symptoms: Secondary | ICD-10-CM | POA: Diagnosis not present

## 2021-04-26 DIAGNOSIS — E1165 Type 2 diabetes mellitus with hyperglycemia: Secondary | ICD-10-CM | POA: Diagnosis not present

## 2021-04-26 DIAGNOSIS — E782 Mixed hyperlipidemia: Secondary | ICD-10-CM | POA: Diagnosis not present

## 2021-04-26 DIAGNOSIS — D518 Other vitamin B12 deficiency anemias: Secondary | ICD-10-CM | POA: Diagnosis not present

## 2021-05-02 DIAGNOSIS — I25110 Atherosclerotic heart disease of native coronary artery with unstable angina pectoris: Secondary | ICD-10-CM | POA: Diagnosis not present

## 2021-05-02 MED ORDER — AMLODIPINE BESYLATE 10 MG PO TABS: 10.0000 mg | ORAL_TABLET | Freq: Every day | ORAL | 3 refills | Status: DC

## 2021-05-18 ENCOUNTER — Other Ambulatory Visit: Payer: Self-pay | Admitting: Cardiology

## 2021-05-18 ENCOUNTER — Other Ambulatory Visit: Payer: Self-pay

## 2021-05-18 MED ORDER — ROSUVASTATIN CALCIUM 40 MG PO TABS: 40.0000 mg | ORAL_TABLET | Freq: Every day | ORAL | 1 refills | Status: DC

## 2021-05-18 NOTE — Telephone Encounter (Signed)
Refill of Crestor 40 mg sent to [facility].

## 2021-05-27 DIAGNOSIS — E038 Other specified hypothyroidism: Secondary | ICD-10-CM | POA: Diagnosis not present

## 2021-05-27 DIAGNOSIS — N401 Enlarged prostate with lower urinary tract symptoms: Secondary | ICD-10-CM | POA: Diagnosis not present

## 2021-05-27 DIAGNOSIS — E1165 Type 2 diabetes mellitus with hyperglycemia: Secondary | ICD-10-CM | POA: Diagnosis not present

## 2021-05-27 DIAGNOSIS — I10 Essential (primary) hypertension: Secondary | ICD-10-CM | POA: Diagnosis not present

## 2021-05-27 DIAGNOSIS — E782 Mixed hyperlipidemia: Secondary | ICD-10-CM | POA: Diagnosis not present

## 2021-05-27 DIAGNOSIS — M150 Primary generalized (osteo)arthritis: Secondary | ICD-10-CM | POA: Diagnosis not present

## 2021-05-27 DIAGNOSIS — D518 Other vitamin B12 deficiency anemias: Secondary | ICD-10-CM | POA: Diagnosis not present

## 2021-05-27 DIAGNOSIS — E559 Vitamin D deficiency, unspecified: Secondary | ICD-10-CM | POA: Diagnosis not present

## 2021-06-13 ENCOUNTER — Other Ambulatory Visit: Payer: Self-pay | Admitting: Cardiology

## 2021-06-15 DIAGNOSIS — E559 Vitamin D deficiency, unspecified: Secondary | ICD-10-CM | POA: Diagnosis not present

## 2021-06-15 DIAGNOSIS — M150 Primary generalized (osteo)arthritis: Secondary | ICD-10-CM | POA: Diagnosis not present

## 2021-06-15 DIAGNOSIS — E782 Mixed hyperlipidemia: Secondary | ICD-10-CM | POA: Diagnosis not present

## 2021-06-15 DIAGNOSIS — N401 Enlarged prostate with lower urinary tract symptoms: Secondary | ICD-10-CM | POA: Diagnosis not present

## 2021-06-15 DIAGNOSIS — D518 Other vitamin B12 deficiency anemias: Secondary | ICD-10-CM | POA: Diagnosis not present

## 2021-06-15 DIAGNOSIS — E038 Other specified hypothyroidism: Secondary | ICD-10-CM | POA: Diagnosis not present

## 2021-06-15 DIAGNOSIS — I10 Essential (primary) hypertension: Secondary | ICD-10-CM | POA: Diagnosis not present

## 2021-06-15 DIAGNOSIS — E1165 Type 2 diabetes mellitus with hyperglycemia: Secondary | ICD-10-CM | POA: Diagnosis not present

## 2021-06-21 NOTE — Progress Notes (Signed)
Cardiology Office Note:    Date:  06/22/2021   ID:  [name], DOB 23-Apr-1946, MRN [id number]  PCP:  Bonnita Nasuti, MD  Cardiologist:  Shirlee More, MD    Referring MD: Bonnita Nasuti, MD    ASSESSMENT:    1. Coronary artery disease of native artery of native heart with stable angina pectoris ([place])   2. S/P CABG x 5   3. Essential hypertension   4. Mixed hyperlipidemia    PLAN:    In order of problems listed above:  Unfortunately Mr. Krotz is doing very poorly with revascularization he has occlusion of the native vessels vein graft stenosis patent left thoracic artery to left anterior descending coronary Mrs. Myocardial perfusion and has frequent and severe life destructive angina despite maximal medical therapy including oral nitrates beta-blocker OCD and calcium channel blocker.  I reviewed extensive records and I agree that TMR is the best option.  I hope he will do some.  At this time I do not see him as a reasonable candidate for cardiac rehabilitation Stable BP at target continue current multidrug regimen Continues high intensity statin   Next appointment: 6 months   Medication Adjustments/Labs and Tests Ordered: Current medicines are reviewed at length with the patient today.  Concerns regarding medicines are outlined above.  Orders Placed This Encounter  Procedures   EKG 12-Lead   No orders of the defined types were placed in this encounter.   Chief Complaint  Patient presents with   Follow-up   Coronary Artery Disease    He recently was seen by CT surgery [place] and was recommended redo CABG for quality of life improvement.  Note the left thoracic coronary artery graft is patent.  Vein grafts are occluded along with his native vessels.   History of Present Illness:    Huriel Matt. is a 75 y.o. adult with a hx of complex heart disease with coronary artery disease CABG April 2020 with early graft occlusion of all vein grafts and  treated medically.  Other problems include hypertension hyper lipidemia he was last seen 12/28/2020  I reached out to [facility] he was a candidate for an angiogenesis trial and he declined.  He has had second opinion [place] 05/02/2021 CT surgery recommended TMR over redo CABG for quality of life.  Compliance with diet, lifestyle and medications: Yes  He seeks my opinion he is having angina with anything more than minor activity and really is displeased with the quality of his life.  I agree that the risk of redo bypass surgery is high I have sent patients to the CT surgeon he saw for the procedure and they felt good results and I think it is the best option to allow him to be more active and have less frequent angina and reduce the likelihood of repeated hospitalizations.  Patient's daughter agrees and he remains ambivalent.  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  No edema orthopnea palpitation or syncope   He had repeat coronary angiography in 04/20/2021 showing that the left thoracic artery remained patent to the LAD total occlusion of all vein grafts and severe three-vessel coronary artery disease he was referred to CT surgery for consideration of TMR. He had an echocardiogram performed at [facility] on 04/21/2021 ejection fraction normal 55 to 60% right ventricle normal size and function and no significant valvular heart disease. He  was seen in the emergency room at [facility]: [facility] 04/19/2021 with chest pain with minor activity and his high-sensitivity troponins were elevated.  Although the emergency room note does not use the verbiage to discharge diagnosis was non-ST elevation MI.  He was admitted to the hospital underwent coronary angiography. Past Medical History:  Diagnosis Date   Anxiety 06/27/2016   Bilateral lower extremity edema 01/10/2019   CAD (coronary artery disease)    s/p CABGX5 10/2018. LHC 02/03/2019: patent LIMA,  all SVG occluded, no target for PCI.    Chest pain 02/12/2019   Coronary artery disease of native artery of native heart with stable angina pectoris ([place]) 12/19/2018   DDD (degenerative disc disease), lumbar 06/27/2016   Diabetes mellitus without complication ([place])    type 2   Dyslexia 06/27/2016   Essential hypertension 12/19/2018   Hyperlipidemia 12/19/2018   Hyperlipidemia LDL goal <70    Hypertension    Lumbosacral radiculopathy at L4 06/27/2016   NSTEMI (non-ST elevated myocardial infarction) ([place]) 11/19/2018   Osteoarthritis of multiple joints 06/27/2016   S/P CABG x 5 11/21/2018   Type 2 diabetes mellitus ([place]) 12/19/2018   Urinary frequency 01/10/2019    Past Surgical History:  Procedure Laterality Date   APPENDECTOMY     BACK SURGERY     CHOLECYSTECTOMY     CORONARY ANGIOPLASTY WITH [place]   CORONARY ARTERY BYPASS GRAFT N/A 11/21/2018   Procedure: CORONARY ARTERY BYPASS GRAFTING (CABG) TIMES FIVE USING LEFT INTERNAL MAMMARY ARTERY AND LEFT SAPHENOUS VEIN HARVESTED ENDOSCOPICALLY;  Surgeon: Gaye Pollack, MD;  Location: [facility];  Service: Open Heart Surgery;  Laterality: N/A;   LEFT HEART CATH AND CORONARY ANGIOGRAPHY N/A 11/19/2018   Procedure: LEFT HEART CATH AND CORONARY ANGIOGRAPHY;  Surgeon: Troy Sine, MD;  Location: [place] [facility];  Service: Cardiovascular;  Laterality: N/A;   LEFT HEART CATH AND CORS/GRAFTS ANGIOGRAPHY N/A 02/03/2019   Procedure: LEFT HEART CATH AND CORS/GRAFTS ANGIOGRAPHY;  Surgeon: Nelva Bush, MD;  Location: [place] [facility];  Service: Cardiovascular;  Laterality: N/A;   TEE WITHOUT CARDIOVERSION N/A 11/21/2018   Procedure: TRANSESOPHAGEAL ECHOCARDIOGRAM (TEE);  Surgeon: Gaye Pollack, MD;  Location: [place];  Service: Open Heart Surgery;  Laterality: N/A;    Current Medications: Current Meds  Medication Sig   amLODipine (NORVASC) 10 MG tablet Take 1 tablet (10 mg total) by mouth daily.   aspirin EC 81 MG tablet Take 1  tablet (81 mg total) by mouth daily.   cloNIDine (CATAPRES) 0.2 MG tablet TAKE ONE TABLET BY MOUTH EVERY NIGHT AT BEDTIME   isosorbide mononitrate (IMDUR) 60 MG 24 hr tablet Take 3 tablets (180 mg total) by mouth daily.   metoprolol tartrate (LOPRESSOR) 25 MG tablet Take 1 tablet (25 mg total) by mouth 2 (two) times daily.   omeprazole (PRILOSEC) 20 MG capsule Take 20 mg by mouth every morning.   rosuvastatin (CRESTOR) 40 MG tablet Take 1 tablet (40 mg total) by mouth daily.   [DISCONTINUED] nitroGLYCERIN (NITROSTAT) 0.4 MG SL tablet Place 0.4 mg under the tongue every 5 (five) minutes as needed for chest pain.     Allergies:   Atorvastatin, Lipitor [atorvastatin calcium], and Penicillins   Social History   Socioeconomic History   Marital status: Married    Spouse name: Not on file   Number of children: Not on file   Years of education: Not on file   Highest education level: Not on file  Occupational History  Not on file  Tobacco Use   Smoking status: Never   Smokeless tobacco: Former    Types: Chew    Quit date: 2020  Vaping Use   Vaping Use: Never used  Substance and Sexual Activity   Alcohol use: Not Currently   Drug use: Never   Sexual activity: Not Currently  Other Topics Concern   Not on file  Social History Narrative   Not on file   Social Determinants of Health   Financial Resource Strain: Not on file  Food Insecurity: Not on file  Transportation Needs: Not on file  Physical Activity: Not on file  Stress: Not on file  Social Connections: Not on file     Family History: The patient's family history includes Cancer in his sister; Diabetes in his sister; Heart attack in his mother and sister; Stroke in his mother and sister. ROS:   Please see the history of present illness.    All other systems reviewed and are negative.  EKGs/Labs/Other Studies Reviewed:    The following studies were reviewed today:  EKG:  EKG ordered today and personally reviewed.  The  ekg ordered today demonstrates sinus bradycardia 52 bpm left anterior hemiblock no ischemic changes  Recent Labs: 12/28/2020: ALT 15; BUN 14; Creatinine, Ser 0.89; Potassium 4.2; Sodium 140  Recent Lipid Panel    Component Value Date/Time   CHOL 94 (L) 12/28/2020 1354   TRIG 261 (H) 12/28/2020 1354   HDL 26 (L) 12/28/2020 1354   CHOLHDL 3.6 12/28/2020 1354   CHOLHDL 3.4 01/31/2019 0637   VLDL 28 01/31/2019 0637   LDLCALC 28 12/28/2020 1354    Physical Exam:    VS:  BP 126/68   Pulse (!) 52   Ht 5\' 9"  (1.753 m)   Wt 175 lb 6.4 oz (79.6 kg)   SpO2 97%   BMI 25.90 kg/m     Wt Readings from Last 3 Encounters:  06/22/21 175 lb 6.4 oz (79.6 kg)  12/28/20 177 lb 12.8 oz (80.6 kg)  10/29/20 178 lb (80.7 kg)     GEN:  Well nourished, well developed in no acute distress HEENT: Normal NECK: No JVD; No carotid bruits LYMPHATICS: No lymphadenopathy CARDIAC: RRR, no murmurs, rubs, gallops RESPIRATORY:  Clear to auscultation without rales, wheezing or rhonchi  ABDOMEN: Soft, non-tender, non-distended MUSCULOSKELETAL:  No edema; No deformity  SKIN: Warm and dry NEUROLOGIC:  Alert and oriented x 3 PSYCHIATRIC:  Normal affect    Signed, Shirlee More, MD  06/22/2021 8:36 AM    [place]

## 2021-06-22 ENCOUNTER — Other Ambulatory Visit: Payer: Self-pay

## 2021-06-22 ENCOUNTER — Ambulatory Visit (INDEPENDENT_AMBULATORY_CARE_PROVIDER_SITE_OTHER): Payer: PPO | Admitting: Cardiology

## 2021-06-22 ENCOUNTER — Encounter: Payer: Self-pay | Admitting: Cardiology

## 2021-06-22 VITALS — BP 126/68 | HR 52 | Ht 69.0 in | Wt 175.4 lb

## 2021-06-22 DIAGNOSIS — I25118 Atherosclerotic heart disease of native coronary artery with other forms of angina pectoris: Secondary | ICD-10-CM

## 2021-06-22 DIAGNOSIS — Z951 Presence of aortocoronary bypass graft: Secondary | ICD-10-CM | POA: Diagnosis not present

## 2021-06-22 DIAGNOSIS — E782 Mixed hyperlipidemia: Secondary | ICD-10-CM

## 2021-06-22 DIAGNOSIS — I10 Essential (primary) hypertension: Secondary | ICD-10-CM | POA: Diagnosis not present

## 2021-06-22 MED ORDER — NITROGLYCERIN 0.4 MG SL SUBL: 0.4000 mg | SUBLINGUAL_TABLET | SUBLINGUAL | 5 refills | Status: AC | PRN

## 2021-06-22 NOTE — Patient Instructions (Signed)

## 2021-06-22 NOTE — Telephone Encounter (Signed)
Nitrostat 0.4 mg sl # 25 x 5 refills sent to [facility]

## 2021-06-22 NOTE — Telephone Encounter (Signed)
Nitrostat 0.4 mg  # 25 tablets x 5 refills sent to [facility]

## 2021-06-28 ENCOUNTER — Other Ambulatory Visit: Payer: Self-pay | Admitting: Cardiology

## 2021-06-28 NOTE — Telephone Encounter (Signed)
Metoprolol tartrate 25 mg # 180 x 3 refills sent to   [facility], [place]

## 2021-07-08 ENCOUNTER — Encounter (HOSPITAL_COMMUNITY): Payer: Self-pay | Admitting: Emergency Medicine

## 2021-07-08 ENCOUNTER — Emergency Department (HOSPITAL_COMMUNITY): Payer: PPO

## 2021-07-08 ENCOUNTER — Emergency Department (HOSPITAL_COMMUNITY)
Admission: EM | Admit: 2021-07-08 | Discharge: 2021-07-08 | Disposition: A | Discharge status: Home / Self Care | Payer: PPO | Attending: Emergency Medicine | Admitting: Emergency Medicine

## 2021-07-08 DIAGNOSIS — Z87891 Personal history of nicotine dependence: Secondary | ICD-10-CM | POA: Diagnosis not present

## 2021-07-08 DIAGNOSIS — R109 Unspecified abdominal pain: Secondary | ICD-10-CM | POA: Diagnosis not present

## 2021-07-08 DIAGNOSIS — K449 Diaphragmatic hernia without obstruction or gangrene: Secondary | ICD-10-CM | POA: Diagnosis not present

## 2021-07-08 DIAGNOSIS — N23 Unspecified renal colic: Secondary | ICD-10-CM | POA: Diagnosis not present

## 2021-07-08 DIAGNOSIS — K5730 Diverticulosis of large intestine without perforation or abscess without bleeding: Secondary | ICD-10-CM | POA: Diagnosis not present

## 2021-07-08 DIAGNOSIS — I10 Essential (primary) hypertension: Secondary | ICD-10-CM | POA: Diagnosis not present

## 2021-07-08 DIAGNOSIS — Z951 Presence of aortocoronary bypass graft: Secondary | ICD-10-CM | POA: Insufficient documentation

## 2021-07-08 DIAGNOSIS — J9811 Atelectasis: Secondary | ICD-10-CM | POA: Diagnosis not present

## 2021-07-08 DIAGNOSIS — N200 Calculus of kidney: Secondary | ICD-10-CM | POA: Insufficient documentation

## 2021-07-08 DIAGNOSIS — I25810 Atherosclerosis of coronary artery bypass graft(s) without angina pectoris: Secondary | ICD-10-CM | POA: Insufficient documentation

## 2021-07-08 LAB — CBC WITH DIFFERENTIAL/PLATELET
Abs Immature Granulocytes: 0.07 10*3/uL (ref 0.00–0.07)
Basophils Absolute: 0.1 10*3/uL (ref 0.0–0.1)
Basophils Relative: 1 %
Eosinophils Absolute: 0.1 10*3/uL (ref 0.0–0.5)
Eosinophils Relative: 1 %
HCT: 47.9 % (ref 39.0–52.0)
Hemoglobin: 16.5 g/dL (ref 13.0–17.0)
Immature Granulocytes: 1 %
Lymphocytes Relative: 11 %
Lymphs Abs: 1.4 10*3/uL (ref 0.7–4.0)
MCH: 29.7 pg (ref 26.0–34.0)
MCHC: 34.4 g/dL (ref 30.0–36.0)
MCV: 86.2 fL (ref 80.0–100.0)
Monocytes Absolute: 0.7 10*3/uL (ref 0.1–1.0)
Monocytes Relative: 5 %
Neutro Abs: 11 10*3/uL — ABNORMAL HIGH (ref 1.7–7.7)
Neutrophils Relative %: 81 %
Platelets: 144 10*3/uL — ABNORMAL LOW (ref 150–400)
RBC: 5.56 MIL/uL (ref 4.22–5.81)
RDW: 11.6 % (ref 11.5–15.5)
WBC: 13.3 10*3/uL — ABNORMAL HIGH (ref 4.0–10.5)
nRBC: 0 % (ref 0.0–0.2)

## 2021-07-08 LAB — URINALYSIS, MICROSCOPIC (REFLEX)
Bacteria, UA: NONE SEEN
RBC / HPF: >50 RBC/hpf (ref 0–5)
Squamous Epithelial / HPF: NONE SEEN (ref 0–5)

## 2021-07-08 LAB — HEPATIC FUNCTION PANEL
ALT: 22 U/L (ref 0–44)
AST: 30 U/L (ref 15–41)
Albumin: 4.1 g/dL (ref 3.5–5.0)
Alkaline Phosphatase: 75 U/L (ref 38–126)
Bilirubin, Direct: 0.4 mg/dL — ABNORMAL HIGH (ref 0.0–0.2)
Indirect Bilirubin: 2 mg/dL — ABNORMAL HIGH (ref 0.3–0.9)
Total Bilirubin: 2.4 mg/dL — ABNORMAL HIGH (ref 0.3–1.2)
Total Protein: 7 g/dL (ref 6.5–8.1)

## 2021-07-08 LAB — URINALYSIS, ROUTINE W REFLEX MICROSCOPIC
Bilirubin Urine: NEGATIVE
Glucose, UA: 250 mg/dL — AB
Ketones, ur: NEGATIVE mg/dL
Leukocytes,Ua: NEGATIVE
Nitrite: NEGATIVE
Protein, ur: 100 mg/dL — AB
Specific Gravity, Urine: 1.02 (ref 1.005–1.030)
pH: 7 (ref 5.0–8.0)

## 2021-07-08 LAB — BASIC METABOLIC PANEL
Anion gap: 11 (ref 5–15)
BUN: 17 mg/dL (ref 8–23)
CO2: 23 mmol/L (ref 22–32)
Calcium: 9.2 mg/dL (ref 8.9–10.3)
Chloride: 103 mmol/L (ref 98–111)
Creatinine, Ser: 1.16 mg/dL (ref 0.61–1.24)
GFR, Estimated: >60 mL/min (ref >60)
Glucose, Bld: 225 mg/dL — ABNORMAL HIGH (ref 70–99)
Potassium: 3.9 mmol/L (ref 3.5–5.1)
Sodium: 137 mmol/L (ref 135–145)

## 2021-07-08 LAB — LIPASE, BLOOD: Lipase: 27 U/L (ref 11–51)

## 2021-07-08 LAB — LACTIC ACID, PLASMA
Lactic Acid, Venous: 2.7 mmol/L (ref 0.5–1.9)
Lactic Acid, Venous: 3 mmol/L (ref 0.5–1.9)

## 2021-07-08 MED ORDER — TAMSULOSIN HCL 0.4 MG PO CAPS: 0.4000 mg | ORAL_CAPSULE | Freq: Every day | ORAL | 0 refills | Status: AC

## 2021-07-08 MED ORDER — ONDANSETRON HCL 4 MG/2ML IJ SOLN
4.0000 mg | Freq: Once | INTRAMUSCULAR | Status: AC
  Administered 2021-07-08: 4 mg via INTRAVENOUS
  Filled 2021-07-08: qty 2

## 2021-07-08 MED ORDER — ONDANSETRON 4 MG PO TBDP: 4.0000 mg | ORAL_TABLET | Freq: Three times a day (TID) | ORAL | 0 refills | Status: DC | PRN

## 2021-07-08 MED ORDER — OXYCODONE-ACETAMINOPHEN 5-325 MG PO TABS: 1.0000 | ORAL_TABLET | Freq: Four times a day (QID) | ORAL | 0 refills | Status: DC | PRN

## 2021-07-08 MED ORDER — FENTANYL CITRATE PF 50 MCG/ML IJ SOSY
100.0000 ug | PREFILLED_SYRINGE | Freq: Once | INTRAMUSCULAR | Status: AC
  Administered 2021-07-08: 100 ug via INTRAVENOUS
  Filled 2021-07-08: qty 2

## 2021-07-08 MED ORDER — SODIUM CHLORIDE 0.9 % IV BOLUS
1000.0000 mL | Freq: Once | INTRAVENOUS | Status: AC
  Administered 2021-07-08: 1000 mL via INTRAVENOUS

## 2021-07-08 MED ORDER — IOHEXOL 350 MG/ML SOLN
100.0000 mL | Freq: Once | INTRAVENOUS | Status: AC | PRN
  Administered 2021-07-08: 100 mL via INTRAVENOUS

## 2021-07-08 MED ORDER — SODIUM CHLORIDE 0.9 % IV SOLN
6.2500 mg | Freq: Four times a day (QID) | INTRAVENOUS | Status: DC | PRN
  Administered 2021-07-08: 6.25 mg via INTRAVENOUS
  Filled 2021-07-08: qty 0.25

## 2021-07-08 MED ORDER — MORPHINE SULFATE (PF) 4 MG/ML IV SOLN
4.0000 mg | Freq: Once | INTRAVENOUS | Status: AC
Start: 2021-07-08 — End: 2021-07-08
  Administered 2021-07-08: 4 mg via INTRAVENOUS
  Filled 2021-07-08: qty 1

## 2021-07-08 NOTE — ED Triage Notes (Signed)
Pt  here from home with c/o llq abd pain that has been ongoing for 1 week , no n/v ,

## 2021-07-08 NOTE — Discharge Instructions (Addendum)
Please drink lots of water over the next few days to keep yourself hydrated.

## 2021-07-08 NOTE — ED Notes (Signed)
Daughter pressed the call light to tell this writer that the patient is vomiting more and feeling nauseous. This Probation officer stated that the nurse was off the floor at this time, but will let the provider know. Dr.Trifan aware.

## 2021-07-08 NOTE — ED Notes (Signed)
Discharge instructions reviewed with patient and family . Patient and family verbalized understanding of instructions. Follow-up care and medications were reviewed. Patient ambulatory with steady gait. VSS upon discharge.

## 2021-07-08 NOTE — ED Notes (Signed)
Pt's daughter touching medical devices and telling staff how to do their jobs. Daughter educated multiple times about not touching equipment and that only medical staff can touch medical equipment.

## 2021-07-08 NOTE — ED Provider Notes (Signed)
[facility] EMERGENCY DEPARTMENT Provider Note   CSN: [id number] Arrival date & time: 07/08/21  7616     History CC: Abdominal pain   Keith Gassman. is a 75 y.o. adult with a history of umbilical hernia presenting to the ED with abdominal pain.  The patient ports he has had persistent lower abdominal pain for the past 4 days, but it acutely worsened this morning when he woke up.  He says the pain is mostly located in the left lower abdomen.  He denies nausea or vomiting.  He is having regular bowel movements including yesterday, no constipation or diarrhea issues.  He is passing gas.  He says the pain is currently quite significant.  He did not take any of his morning medications.  Denies any chest pain, fevers, chills.  He does report urinary frequency for several days.  He was seen in the Ed in June 2017 per record review for LLQ abdominal pain and found to have 3 mm left ureteral stone as likely cause of his symptoms (on CT at the time).  He had a noted supraumbilical hernia fat containing measuring 3.6 x 2.5 cm at the time.  He does have a significant coronary history of an NSTEMI earlier this year, but denies any active chest pain or pressure.  He takes 81 mg of aspirin, as well as blood pressure medications and a statin. Last LHC in July 2020 with severe multivessel disease, per Dr Harrell Gave End there were no good targets for intervention, recommending medical management moving forward.  Saw cardiologist Dr Bettina Gavia on 06/22/21 in the office, per office note: "Unfortunately Mr. Gobin is doing very poorly with revascularization he has occlusion of the native vessels vein graft stenosis patent left thoracic artery to left anterior descending coronary Mrs. Myocardial perfusion and has frequent and severe life destructive angina despite maximal medical therapy including oral nitrates beta-blocker OCD and calcium channel blocker.  I reviewed extensive records and I  agree that TMR is the best option.  I hope he will do some.  At this time I do not see him as a reasonable candidate for cardiac rehabilitation."  HPI     Past Medical History:  Diagnosis Date   Anxiety 06/27/2016   Bilateral lower extremity edema 01/10/2019   CAD (coronary artery disease)    s/p CABGX5 10/2018. LHC 02/03/2019: patent LIMA, all SVG occluded, no target for PCI.    Chest pain 02/12/2019   Coronary artery disease of native artery of native heart with stable angina pectoris ([place]) 12/19/2018   DDD (degenerative disc disease), lumbar 06/27/2016   Diabetes mellitus without complication ([place])    type 2   Dyslexia 06/27/2016   Essential hypertension 12/19/2018   Hyperlipidemia 12/19/2018   Hyperlipidemia LDL goal <70    Hypertension    Lumbosacral radiculopathy at L4 06/27/2016   NSTEMI (non-ST elevated myocardial infarction) ([place]) 11/19/2018   Osteoarthritis of multiple joints 06/27/2016   S/P CABG x 5 11/21/2018   Type 2 diabetes mellitus ([place]) 12/19/2018   Urinary frequency 01/10/2019    Patient Active Problem List   Diagnosis Date Noted   Hypertension    Diabetes mellitus without complication ([place])    CAD (coronary artery disease)    Chest pain 02/12/2019   Hyperlipidemia LDL goal <70    Bilateral lower extremity edema 01/10/2019   Urinary frequency 01/10/2019   Coronary artery disease of native artery of native heart with stable angina pectoris ([place]) 12/19/2018  Essential hypertension 12/19/2018   Hyperlipidemia 12/19/2018   Type 2 diabetes mellitus ([place]) 12/19/2018   S/P CABG x 5 11/21/2018   NSTEMI (non-ST elevated myocardial infarction) ([place]) 11/19/2018   Anxiety 06/27/2016   DDD (degenerative disc disease), lumbar 06/27/2016   Dyslexia 06/27/2016   Lumbosacral radiculopathy at L4 06/27/2016   Osteoarthritis of multiple joints 06/27/2016    Past Surgical History:  Procedure Laterality Date   APPENDECTOMY     BACK SURGERY     CHOLECYSTECTOMY     CORONARY  ANGIOPLASTY WITH STENT PLACEMENT  1998   CORONARY ARTERY BYPASS GRAFT N/A 11/21/2018   Procedure: CORONARY ARTERY BYPASS GRAFTING (CABG) TIMES FIVE USING LEFT INTERNAL MAMMARY ARTERY AND LEFT SAPHENOUS VEIN HARVESTED ENDOSCOPICALLY;  Surgeon: Gaye Pollack, MD;  Location: [place];  Service: Open Heart Surgery;  Laterality: N/A;   LEFT HEART CATH AND CORONARY ANGIOGRAPHY N/A 11/19/2018   Procedure: LEFT HEART CATH AND CORONARY ANGIOGRAPHY;  Surgeon: [place] Sine, MD;  Location: [place] [facility];  Service: Cardiovascular;  Laterality: N/A;   LEFT HEART CATH AND CORS/GRAFTS ANGIOGRAPHY N/A 02/03/2019   Procedure: LEFT HEART CATH AND CORS/GRAFTS ANGIOGRAPHY;  Surgeon: Nelva Bush, MD;  Location: [place] [facility];  Service: Cardiovascular;  Laterality: N/A;   TEE WITHOUT CARDIOVERSION N/A 11/21/2018   Procedure: TRANSESOPHAGEAL ECHOCARDIOGRAM (TEE);  Surgeon: Gaye Pollack, MD;  Location: [place];  Service: Open Heart Surgery;  Laterality: N/A;     OB History   No obstetric history on file.     Family History  Problem Relation Age of Onset   Heart attack Mother    Stroke Mother    Diabetes Sister    Heart attack Sister    Stroke Sister    Cancer Sister     Social History   Tobacco Use   Smoking status: Never   Smokeless tobacco: Former    Types: Chew    Quit date: 2020  Vaping Use   Vaping Use: Never used  Substance Use Topics   Alcohol use: Not Currently   Drug use: Never    Home Medications Prior to Admission medications   Medication Sig Start Date End Date Taking? Authorizing Provider  amLODipine (NORVASC) 10 MG tablet Take 1 tablet (10 mg total) by mouth daily. 05/02/21  Yes Richardo Priest, MD  aspirin EC 81 MG tablet Take 1 tablet (81 mg total) by mouth daily. 04/15/20  Yes Richardo Priest, MD  cloNIDine (CATAPRES) 0.2 MG tablet TAKE ONE TABLET BY MOUTH EVERY NIGHT AT BEDTIME Patient taking differently: Take 0.2 mg by mouth at bedtime. 06/13/21  Yes Richardo Priest, MD  isosorbide mononitrate (IMDUR) 60 MG 24 hr tablet Take 3 tablets (180 mg total) by mouth daily. 10/29/20  Yes Richardo Priest, MD  metoprolol tartrate (LOPRESSOR) 25 MG tablet TAKE ONE TABLET TWICE DAILY Patient taking differently: Take 25 mg by mouth 2 (two) times daily. 06/28/21  Yes Richardo Priest, MD  nitroGLYCERIN (NITROSTAT) 0.4 MG SL tablet Place 1 tablet (0.4 mg total) under the tongue every 5 (five) minutes as needed for chest pain. 06/22/21  Yes Richardo Priest, MD  omeprazole (PRILOSEC) 20 MG capsule Take 20 mg by mouth every morning.   Yes [provider]  ondansetron (ZOFRAN-ODT) 4 MG disintegrating tablet Take 1 tablet (4 mg total) by mouth every 8 (eight) hours as needed for up to 15 doses for nausea or vomiting. 07/08/21  Yes Jermery Caratachea, Carola Rhine, MD  oxyCODONE-acetaminophen (PERCOCET/ROXICET) 5-325 MG tablet Take 1 tablet by mouth every 6 (six) hours as needed for up to 15 doses for severe pain. 07/08/21  Yes Wyvonnia Dusky, MD  rosuvastatin (CRESTOR) 40 MG tablet Take 1 tablet (40 mg total) by mouth daily. 05/18/21  Yes Richardo Priest, MD  tamsulosin (FLOMAX) 0.4 MG CAPS capsule Take 1 capsule (0.4 mg total) by mouth daily after breakfast for 15 days. 07/08/21 07/23/21 Yes Rubylee Zamarripa, Carola Rhine, MD    Allergies    Atorvastatin, Lipitor [atorvastatin calcium], and Penicillins  Review of Systems   Review of Systems  Constitutional:  Negative for chills and fever.  HENT:  Negative for ear pain and sore throat.   Eyes:  Negative for pain and visual disturbance.  Respiratory:  Negative for cough and shortness of breath.   Cardiovascular:  Negative for chest pain and palpitations.  Gastrointestinal:  Positive for abdominal pain. Negative for constipation, diarrhea, nausea and vomiting.  Genitourinary:  Positive for frequency. Negative for hematuria.  Musculoskeletal:  Negative for arthralgias and myalgias.  Skin:  Negative for color change and rash.  Neurological:   Negative for syncope and headaches.  All other systems reviewed and are negative.  Physical Exam Updated Vital Signs BP 127/63   Pulse (!) 57   Temp 98.1 F (36.7 C) (Oral)   Resp 20   SpO2 99%   Physical Exam Constitutional:      General: He is not in acute distress. HENT:     Head: Normocephalic and atraumatic.  Eyes:     Conjunctiva/sclera: Conjunctivae normal.     Pupils: Pupils are equal, round, and reactive to light.  Cardiovascular:     Rate and Rhythm: Normal rate and regular rhythm.  Pulmonary:     Effort: Pulmonary effort is normal. No respiratory distress.  Abdominal:     General: There is no distension.     Tenderness: There is abdominal tenderness in the periumbilical area and left upper quadrant.     Comments: Soft paraumbilical hernia that is reducible   Skin:    General: Skin is warm and dry.  Neurological:     General: No focal deficit present.     Mental Status: He is alert. Mental status is at baseline.  Psychiatric:        Mood and Affect: Mood normal.        Behavior: Behavior normal.    ED Results / Procedures / Treatments   Labs (all labs ordered are listed, but only abnormal results are displayed) Labs Reviewed  CBC WITH DIFFERENTIAL/PLATELET - Abnormal; Notable for the following components:      Result Value   WBC 13.3 (*)    Platelets 144 (*)    Neutro Abs 11.0 (*)    All other components within normal limits  BASIC METABOLIC PANEL - Abnormal; Notable for the following components:   Glucose, Bld 225 (*)    All other components within normal limits  HEPATIC FUNCTION PANEL - Abnormal; Notable for the following components:   Total Bilirubin 2.4 (*)    Bilirubin, Direct 0.4 (*)    Indirect Bilirubin 2.0 (*)    All other components within normal limits  URINALYSIS, ROUTINE W REFLEX MICROSCOPIC - Abnormal; Notable for the following components:   APPearance HAZY (*)    Glucose, UA 250 (*)    Hgb urine dipstick LARGE (*)    Protein, ur 100  (*)    All other components within normal limits  LACTIC ACID, PLASMA - Abnormal; Notable for the following components:   Lactic Acid, Venous 2.7 (*)    All other components within normal limits  LACTIC ACID, PLASMA - Abnormal; Notable for the following components:   Lactic Acid, Venous 3.0 (*)    All other components within normal limits  LIPASE, BLOOD  URINALYSIS, MICROSCOPIC (REFLEX)    EKG None  Radiology CT Angio Abd/Pel W and/or Wo Contrast  Result Date: 07/08/2021 CLINICAL DATA:  Acute worsening left lower quadrant abdominal pain, concern for mesenteric ischemia EXAM: CT ANGIOGRAPHY ABDOMEN AND PELVIS WITH CONTRAST AND WITHOUT CONTRAST TECHNIQUE: Multidetector CT imaging of the abdomen and pelvis was performed using the standard protocol during bolus administration of intravenous contrast. Multiplanar reconstructed images and MIPs were obtained and reviewed to evaluate the vascular anatomy. CONTRAST:  114mL OMNIPAQUE IOHEXOL 350 MG/ML SOLN COMPARISON:  01/20/2016 FINDINGS: VASCULAR Aorta: Aortic atherosclerosis without aneurysm, dissection, occlusive process or acute rupture. No retroperitoneal hemorrhage or hematoma. Celiac: Widely patent origin including its branches SMA: Widely patent origin including its branches in the central mesentery Renals: Atherosclerotic origins but remain patent. No accessory renal artery appreciated. IMA: Remains patent off the distal aorta including its branches. Inflow: Minor atherosclerotic change and tortuosity without inflow disease or occlusion. Common, internal and external iliac arteries all remain patent. Proximal Outflow: Atherosclerotic changes but the common femoral, proximal profunda femoral, proximal superficial femoral arteries all remain patent. Veins: No veno-occlusive process. Review of the MIP images confirms the above findings. NON-VASCULAR Lower chest: Minor dependent basilar atelectasis. Mild cardiac enlargement. No pericardial or pleural  effusion. Small hiatal hernia. Hepatobiliary: Anterior liver demonstrates surface nodularity compatible with a component of hepatic cirrhosis. No biliary dilatation. No focal hepatic abnormality. Hepatic and portal veins are patent. Remote cholecystectomy. Stable dilatation of the common bile duct, suspect postcholecystectomy related. Pancreas: Unremarkable. No pancreatic ductal dilatation or surrounding inflammatory changes. Spleen: Spleen is enlarged measuring 15 cm in length. No focal abnormality. Adrenals/Urinary Tract: Normal adrenal glands. Right kidney and ureter demonstrate lower pole cyst. No renal obstruction or hydronephrosis. No hydroureter. No right ureteral calculus. Left kidney demonstrates mild perinephric strandy edema/inflammation and mild left hydroureteronephrosis. This is secondary to an obstructing 4 mm left mid ureteral calculus as the ureter crosses the iliac vessels. This is confirmed on the coronal and sagittal reconstructions. Below this calculus, the left ureter is mildly prominent to the left UVJ. There is also a left mid to lower pole anterior hypodense cyst measuring 4.2 cm. Bladder unremarkable. Stomach/Bowel: Small hiatal hernia. Very small early esophageal varices noted. Negative for bowel obstruction, dilatation, ileus, or free air. Appendix not visualized. No acute inflammatory process in the right lower quadrant. Scattered colonic diverticulosis. No acute inflammatory process. No free fluid, fluid collection, hemorrhage, hematoma, abscess or ascites. Lymphatic: No adenopathy. Reproductive: Mild prostate enlargement. Prominent seminal vesicles. No acute finding by CT. Other: Small fat containing umbilical hernia. Musculoskeletal: Degenerative changes throughout the spine. Slight scoliosis. No acute osseous finding. Preserved vertebral body heights. IMPRESSION: VASCULAR Aortoiliac atherosclerosis without acute vascular process. Mesenteric and renal vasculature all appear patent. No  evidence of mesenteric vascular occlusive disease. NON-VASCULAR 4 mm left mid ureteral mildly obstructing calculus with associated left hydroureteronephrosis and perinephric inflammation/edema. Hepatic cirrhosis with evidence of portal hypertension with associated splenomegaly and small early esophageal varices. Small hiatal hernia Remote cholecystectomy Colonic diverticulosis Electronically Signed   By: Jerilynn Mages.  Shick M.D.   On: 07/08/2021 13:10    Procedures Procedures   Medications Ordered in ED  Medications  promethazine (PHENERGAN) 6.25 mg in sodium chloride 0.9 % 50 mL IVPB (0 mg Intravenous Stopped 07/08/21 1358)  morphine 4 MG/ML injection 4 mg (4 mg Intravenous Given 07/08/21 1021)  sodium chloride 0.9 % bolus 1,000 mL (0 mLs Intravenous Stopped 07/08/21 1222)  ondansetron (ZOFRAN) injection 4 mg (4 mg Intravenous Given 07/08/21 1021)  fentaNYL (SUBLIMAZE) injection 100 mcg (100 mcg Intravenous Given 07/08/21 1140)  iohexol (OMNIPAQUE) 350 MG/ML injection 100 mL (100 mLs Intravenous Contrast Given 07/08/21 1209)    ED Course  I have reviewed the triage vital signs and the nursing notes.  Pertinent labs & imaging results that were available during my care of the patient were reviewed by me and considered in my medical decision making (see chart for details).  Diagnosis for the patient's abdominal pain would include ureteral colic and kidney stone versus UTI versus diverticulitis versus colitis versus strangulated hernia vs mesenteric ischemia vs other  Patient will likely need a CT scan to help differentiate these etiologies.  He does have a history of lower left-sided kidney stones per my review of his medical records, this remains a possibility.   Based on his presentation, however, I cannot completely exclude concerns for mesenteric ischemia with worsening bowel pain.  He is not having nausea or vomiting or other GI symptoms, which is reassuring, but we will check a lactate level and consider  CTA imaging to assess the differential.  IV morphine and fluids ordered for pain and poor PO intake.  I personally reviewed his prior medical records.  I am familiar with his significant cardiac history, he has a candidate only for medical management at this time.  He has absolutely no chest pain or pressure or other cardiac symptoms, and I suspect this is an intra-abdominal process.  Less likely AAA with no history of aneurysm (none noted on June 2017 CT scan).  I personally reviewed and interpreted the patient's imaging and work-up today, notable for 4 mm left sided renal stone.  No AKI or evidence of infection.  Reactive leukocytosis likely 2/2 pain and vomiting.  Afebrile.  Doubt pyelonephritis or sepsis.  Clinical Course as of 07/08/21 1705  Fri Jul 08, 2021  1031 Hgb urine dipstick(!): LARGE [MT]  1031 No evidence of infection pursue a sample, but there is large hemoglobin, which could be consistent with a kidney stone [MT]  1120 Lactic Acid, Venous(!!): 2.7 [MT]  1136 Patient became nauseated after the morphine and several episodes of emesis, now is having worsening pain in his abdomen.  He is having difficulty holding still.  He has some generalized tenderness on my repeat exam, no guarding.  I have asked for additional IV pain medications will call CT to ask for expedited CT scan. [MT]  1315 NON-VASCULAR   4 mm left mid ureteral mildly obstructing calculus with associated left hydroureteronephrosis and perinephric inflammation/edema.   Hepatic cirrhosis with evidence of portal hypertension with associated splenomegaly and small early esophageal varices.   Small hiatal hernia   Remote cholecystectomy   Colonic diverticulosi [MT]  1329 Pain significantly improved, discussed renal stone finding as likely cause of symptoms.  Will reassess the patient in an hour or two to determine if he would require admisison for pain control vs home management. [MT]  2751 Pt remains pain free.   Lactic acid increased after vomiting.  They want to try to manage this at home.  I think that's reasonable.  Doubt AAA, mesenteric ischemia. [MT]    Clinical  Course User Index [MT] Adlee Paar, Carola Rhine, MD   Final Clinical Impression(s) / ED Diagnoses Final diagnoses:  Ureteral colic  Kidney stone    Rx / DC Orders ED Discharge Orders          Ordered    oxyCODONE-acetaminophen (PERCOCET/ROXICET) 5-325 MG tablet  Every 6 hours PRN        07/08/21 1517    tamsulosin (FLOMAX) 0.4 MG CAPS capsule  Daily after breakfast        07/08/21 1517    ondansetron (ZOFRAN-ODT) 4 MG disintegrating tablet  Every 8 hours PRN        07/08/21 1517             Wyvonnia Dusky, MD 07/08/21 1707

## 2021-07-12 DIAGNOSIS — M150 Primary generalized (osteo)arthritis: Secondary | ICD-10-CM | POA: Diagnosis not present

## 2021-07-12 DIAGNOSIS — E1165 Type 2 diabetes mellitus with hyperglycemia: Secondary | ICD-10-CM | POA: Diagnosis not present

## 2021-07-12 DIAGNOSIS — I10 Essential (primary) hypertension: Secondary | ICD-10-CM | POA: Diagnosis not present

## 2021-07-12 DIAGNOSIS — D518 Other vitamin B12 deficiency anemias: Secondary | ICD-10-CM | POA: Diagnosis not present

## 2021-07-12 DIAGNOSIS — N401 Enlarged prostate with lower urinary tract symptoms: Secondary | ICD-10-CM | POA: Diagnosis not present

## 2021-07-12 DIAGNOSIS — E559 Vitamin D deficiency, unspecified: Secondary | ICD-10-CM | POA: Diagnosis not present

## 2021-07-12 DIAGNOSIS — E782 Mixed hyperlipidemia: Secondary | ICD-10-CM | POA: Diagnosis not present

## 2021-07-12 DIAGNOSIS — E038 Other specified hypothyroidism: Secondary | ICD-10-CM | POA: Diagnosis not present

## 2021-08-04 DIAGNOSIS — I10 Essential (primary) hypertension: Secondary | ICD-10-CM | POA: Diagnosis not present

## 2021-08-04 DIAGNOSIS — E559 Vitamin D deficiency, unspecified: Secondary | ICD-10-CM | POA: Diagnosis not present

## 2021-08-04 DIAGNOSIS — E038 Other specified hypothyroidism: Secondary | ICD-10-CM | POA: Diagnosis not present

## 2021-08-04 DIAGNOSIS — E782 Mixed hyperlipidemia: Secondary | ICD-10-CM | POA: Diagnosis not present

## 2021-08-04 DIAGNOSIS — E1165 Type 2 diabetes mellitus with hyperglycemia: Secondary | ICD-10-CM | POA: Diagnosis not present

## 2021-08-04 DIAGNOSIS — N401 Enlarged prostate with lower urinary tract symptoms: Secondary | ICD-10-CM | POA: Diagnosis not present

## 2021-08-04 DIAGNOSIS — M150 Primary generalized (osteo)arthritis: Secondary | ICD-10-CM | POA: Diagnosis not present

## 2021-08-04 DIAGNOSIS — D518 Other vitamin B12 deficiency anemias: Secondary | ICD-10-CM | POA: Diagnosis not present

## 2021-08-10 ENCOUNTER — Other Ambulatory Visit: Payer: Self-pay | Admitting: Cardiology

## 2021-08-31 DIAGNOSIS — I10 Essential (primary) hypertension: Secondary | ICD-10-CM | POA: Diagnosis not present

## 2021-08-31 DIAGNOSIS — E559 Vitamin D deficiency, unspecified: Secondary | ICD-10-CM | POA: Diagnosis not present

## 2021-08-31 DIAGNOSIS — E038 Other specified hypothyroidism: Secondary | ICD-10-CM | POA: Diagnosis not present

## 2021-08-31 DIAGNOSIS — D518 Other vitamin B12 deficiency anemias: Secondary | ICD-10-CM | POA: Diagnosis not present

## 2021-08-31 DIAGNOSIS — E1165 Type 2 diabetes mellitus with hyperglycemia: Secondary | ICD-10-CM | POA: Diagnosis not present

## 2021-08-31 DIAGNOSIS — M150 Primary generalized (osteo)arthritis: Secondary | ICD-10-CM | POA: Diagnosis not present

## 2021-08-31 DIAGNOSIS — N401 Enlarged prostate with lower urinary tract symptoms: Secondary | ICD-10-CM | POA: Diagnosis not present

## 2021-08-31 DIAGNOSIS — E782 Mixed hyperlipidemia: Secondary | ICD-10-CM | POA: Diagnosis not present

## 2021-10-03 DIAGNOSIS — N401 Enlarged prostate with lower urinary tract symptoms: Secondary | ICD-10-CM | POA: Diagnosis not present

## 2021-10-03 DIAGNOSIS — E559 Vitamin D deficiency, unspecified: Secondary | ICD-10-CM | POA: Diagnosis not present

## 2021-10-03 DIAGNOSIS — M150 Primary generalized (osteo)arthritis: Secondary | ICD-10-CM | POA: Diagnosis not present

## 2021-10-03 DIAGNOSIS — I10 Essential (primary) hypertension: Secondary | ICD-10-CM | POA: Diagnosis not present

## 2021-10-03 DIAGNOSIS — D518 Other vitamin B12 deficiency anemias: Secondary | ICD-10-CM | POA: Diagnosis not present

## 2021-10-03 DIAGNOSIS — E1165 Type 2 diabetes mellitus with hyperglycemia: Secondary | ICD-10-CM | POA: Diagnosis not present

## 2021-10-03 DIAGNOSIS — E782 Mixed hyperlipidemia: Secondary | ICD-10-CM | POA: Diagnosis not present

## 2021-10-03 DIAGNOSIS — E038 Other specified hypothyroidism: Secondary | ICD-10-CM | POA: Diagnosis not present

## 2021-11-02 DIAGNOSIS — N401 Enlarged prostate with lower urinary tract symptoms: Secondary | ICD-10-CM | POA: Diagnosis not present

## 2021-11-02 DIAGNOSIS — E782 Mixed hyperlipidemia: Secondary | ICD-10-CM | POA: Diagnosis not present

## 2021-11-02 DIAGNOSIS — D518 Other vitamin B12 deficiency anemias: Secondary | ICD-10-CM | POA: Diagnosis not present

## 2021-11-02 DIAGNOSIS — M150 Primary generalized (osteo)arthritis: Secondary | ICD-10-CM | POA: Diagnosis not present

## 2021-11-02 DIAGNOSIS — E559 Vitamin D deficiency, unspecified: Secondary | ICD-10-CM | POA: Diagnosis not present

## 2021-11-02 DIAGNOSIS — E038 Other specified hypothyroidism: Secondary | ICD-10-CM | POA: Diagnosis not present

## 2021-11-02 DIAGNOSIS — I10 Essential (primary) hypertension: Secondary | ICD-10-CM | POA: Diagnosis not present

## 2021-11-02 DIAGNOSIS — E1165 Type 2 diabetes mellitus with hyperglycemia: Secondary | ICD-10-CM | POA: Diagnosis not present

## 2021-11-14 ENCOUNTER — Other Ambulatory Visit: Payer: Self-pay | Admitting: Cardiology

## 2021-11-18 ENCOUNTER — Other Ambulatory Visit: Payer: Self-pay | Admitting: Cardiology

## 2021-11-21 ENCOUNTER — Encounter: Payer: Self-pay | Admitting: Cardiology

## 2021-11-21 MED ORDER — ROSUVASTATIN CALCIUM 40 MG PO TABS: 40.0000 mg | ORAL_TABLET | Freq: Every day | ORAL | 1 refills | Status: DC

## 2021-11-29 DIAGNOSIS — E038 Other specified hypothyroidism: Secondary | ICD-10-CM | POA: Diagnosis not present

## 2021-11-29 DIAGNOSIS — M150 Primary generalized (osteo)arthritis: Secondary | ICD-10-CM | POA: Diagnosis not present

## 2021-11-29 DIAGNOSIS — E1165 Type 2 diabetes mellitus with hyperglycemia: Secondary | ICD-10-CM | POA: Diagnosis not present

## 2021-11-29 DIAGNOSIS — N401 Enlarged prostate with lower urinary tract symptoms: Secondary | ICD-10-CM | POA: Diagnosis not present

## 2021-11-29 DIAGNOSIS — D518 Other vitamin B12 deficiency anemias: Secondary | ICD-10-CM | POA: Diagnosis not present

## 2021-11-29 DIAGNOSIS — E559 Vitamin D deficiency, unspecified: Secondary | ICD-10-CM | POA: Diagnosis not present

## 2021-11-29 DIAGNOSIS — E782 Mixed hyperlipidemia: Secondary | ICD-10-CM | POA: Diagnosis not present

## 2021-11-29 DIAGNOSIS — I10 Essential (primary) hypertension: Secondary | ICD-10-CM | POA: Diagnosis not present

## 2021-12-02 ENCOUNTER — Other Ambulatory Visit: Payer: Self-pay | Admitting: Cardiology

## 2021-12-21 ENCOUNTER — Other Ambulatory Visit: Payer: Self-pay

## 2021-12-27 NOTE — Progress Notes (Unsigned)
Cardiology Office Note:    Date:  12/28/2021   ID:  Keith Lucero., DOB 10-26-1945, MRN [id number]  PCP:  Bonnita Nasuti, MD  Cardiologist:  Shirlee More, MD    Referring MD: Bonnita Nasuti, MD    ASSESSMENT:    1. Coronary artery disease of bypass graft of native heart with stable angina pectoris ([place])   2. Essential hypertension   3. Mixed hyperlipidemia    PLAN:    In order of problems listed above:  Unfortunately is a poor quality of life with graft occlusion markedly diminished exercise tolerance fortunately not having frequent angina he has declined TMR or gene therapy.  Continue antihypertensive medical regimen including aspirin calcium channel blocker beta-blocker oral nitrate and has been intolerant of ranolazine in the past. Stable continue his current regimen including amlodipine and clonidine Continue his high intensity statin check lipid labs including CMP lipid profile with his profound fatigue we will also add a CBC and a TSH   Next appointment: 6 months   Medication Adjustments/Labs and Tests Ordered: Current medicines are reviewed at length with the patient today.  Concerns regarding medicines are outlined above.  No orders of the defined types were placed in this encounter.  No orders of the defined types were placed in this encounter.   Chief Complaint  Patient presents with   Follow-up   Coronary Artery Disease    History of Present Illness:    Keith Groft. is a 76 y.o. adult with a hx of complex heart disease with coronary artery disease CABG April 2020 with early graft occlusion of all vein grafts and treated medically.  Other problems include hypertension hyper lipidemia he was last seen 12/28/2020  I reached out to [facility] he was a candidate for an angiogenesis trial and he declined.  He has had second opinion [place] 05/02/2021 CT surgery recommended TMR over redo CABG for quality of life.  He was last seen  08/22/2021.  He had repeat coronary angiography in 04/20/2021 showing that the left thoracic artery remained patent to the LAD total occlusion of all vein grafts and severe three-vessel coronary artery disease he was referred to CT surgery for consideration of TMR. He had an echocardiogram performed at [facility] on 04/21/2021 ejection fraction normal 55 to 60% right ventricle normal size and function and no significant valvular heart disease. He was seen in the emergency room at [facility] 04/19/2021 with chest pain with minor activity and his high-sensitivity troponins were elevated.  Although the emergency room note does not use the verbiage to discharge diagnosis was non-ST elevation MI.  He was admitted to the hospital underwent coronary angiography  Compliance with diet, lifestyle and medications: Yes  He is sedentary does not really get out of the house seems to be sad and depressed his primary complaint is fatigue  Fortunately does not have angina very often and when he does he is relieved with rest has not needed nitroglycerin  Tracks his blood pressure at home and remains in range he has not had any recurrent paroxysms  Past Medical History:  Diagnosis Date   Anxiety 06/27/2016   Bilateral lower extremity edema 01/10/2019   CAD (coronary artery disease)    s/p CABGX5 10/2018. LHC 02/03/2019: patent LIMA, all SVG occluded, no target for PCI.    Chest pain 02/12/2019   Coronary artery disease involving native coronary artery of native heart with unstable angina pectoris ([place]) 01/01/2020   Last  Assessment & Plan:  Formatting of this note might be different from the original. In summary the patient is a 76 year old man with severe three-vessel coronary artery disease.  Severe coronary artery disease is a life threatening medical problem.  Treatment of choice is revascularization either by stent or by bypass surgery particularly when there is 3 vessel disease.  In his case redo surger   Coronary  artery disease of native artery of native heart with stable angina pectoris ([place]) 12/19/2018   DDD (degenerative disc disease), lumbar 06/27/2016   Diabetes mellitus without complication ([place])    type 2   Dyslexia 06/27/2016   Essential hypertension 12/19/2018   Gastro-esophageal reflux disease without esophagitis 04/19/2021   Hyperlipidemia 12/19/2018   Hyperlipidemia LDL goal <70    Hypertension    Lumbosacral radiculopathy at L4 06/27/2016   Mixed hyperlipidemia 04/19/2021   NSTEMI (non-ST elevated myocardial infarction) ([place]) 11/19/2018   Osteoarthritis of multiple joints 06/27/2016   Prolonged Q-T interval on ECG 04/21/2021   Last Assessment & Plan:  Formatting of this note might be different from the original. Prolonged QT. Repeat EKG today reveals the QTC to be down to 432 milliseconds off Ranexa.  Plan: 1. Continue to hold Ranexa and medications that prolong the QTc.   S/P CABG x 5 11/21/2018   Type 2 diabetes mellitus ([place]) 12/19/2018   Urinary frequency 01/10/2019    Past Surgical History:  Procedure Laterality Date   APPENDECTOMY     BACK SURGERY     CHOLECYSTECTOMY     CORONARY ANGIOPLASTY WITH STENT PLACEMENT  1998   CORONARY ARTERY BYPASS GRAFT N/A 11/21/2018   Procedure: CORONARY ARTERY BYPASS GRAFTING (CABG) TIMES FIVE USING LEFT INTERNAL MAMMARY ARTERY AND LEFT SAPHENOUS VEIN HARVESTED ENDOSCOPICALLY;  Surgeon: Gaye Pollack, MD;  Location: [place];  Service: Open Heart Surgery;  Laterality: N/A;   LEFT HEART CATH AND CORONARY ANGIOGRAPHY N/A 11/19/2018   Procedure: LEFT HEART CATH AND CORONARY ANGIOGRAPHY;  Surgeon: Troy Sine, MD;  Location: [place] CV LAB;  Service: Cardiovascular;  Laterality: N/A;   LEFT HEART CATH AND CORS/GRAFTS ANGIOGRAPHY N/A 02/03/2019   Procedure: LEFT HEART CATH AND CORS/GRAFTS ANGIOGRAPHY;  Surgeon: Nelva Bush, MD;  Location: [place] CV LAB;  Service: Cardiovascular;  Laterality: N/A;   TEE WITHOUT CARDIOVERSION N/A 11/21/2018    Procedure: TRANSESOPHAGEAL ECHOCARDIOGRAM (TEE);  Surgeon: Gaye Pollack, MD;  Location: [place];  Service: Open Heart Surgery;  Laterality: N/A;    Current Medications: Current Meds  Medication Sig   amLODipine (NORVASC) 10 MG tablet Take 1 tablet (10 mg total) by mouth daily.   aspirin EC 81 MG tablet Take 1 tablet (81 mg total) by mouth daily.   cloNIDine (CATAPRES) 0.2 MG tablet TAKE ONE TABLET BY MOUTH EVERY NIGHT AT BEDTIME   isosorbide mononitrate (IMDUR) 60 MG 24 hr tablet TAKE 3 TABLETS BY MOUTH DAILY   metoprolol tartrate (LOPRESSOR) 25 MG tablet Take 25 mg by mouth 2 (two) times daily.   nitroGLYCERIN (NITROSTAT) 0.4 MG SL tablet Place 1 tablet (0.4 mg total) under the tongue every 5 (five) minutes as needed for chest pain.   omeprazole (PRILOSEC) 20 MG capsule Take 20 mg by mouth every morning.   rosuvastatin (CRESTOR) 40 MG tablet Take 1 tablet (40 mg total) by mouth daily.     Allergies:   Atorvastatin, Lipitor [atorvastatin calcium], and Penicillins   Social History   Socioeconomic History   Marital status: Married  Spouse name: Not on file   Number of children: Not on file   Years of education: Not on file   Highest education level: Not on file  Occupational History   Not on file  Tobacco Use   Smoking status: Never   Smokeless tobacco: Former    Types: Sarina Ser    Quit date: 2020  Vaping Use   Vaping Use: Never used  Substance and Sexual Activity   Alcohol use: Not Currently   Drug use: Never   Sexual activity: Not Currently  Other Topics Concern   Not on file  Social History Narrative   Not on file   Social Determinants of Health   Financial Resource Strain: Not on file  Food Insecurity: Not on file  Transportation Needs: Not on file  Physical Activity: Not on file  Stress: Not on file  Social Connections: Not on file     Family History: The patient's family history includes Cancer in his sister; Diabetes in his sister; Heart attack in his mother  and sister; Stroke in his mother and sister. ROS:   Please see the history of present illness.    All other systems reviewed and are negative.  EKGs/Labs/Other Studies Reviewed:    The following studies were reviewed today:   Recent Labs: 07/08/2021: ALT 22; BUN 17; Creatinine, Ser 1.16; Hemoglobin 16.5; Platelets 144; Potassium 3.9; Sodium 137  Recent Lipid Panel    Component Value Date/Time   CHOL 94 (L) 12/28/2020 1354   TRIG 261 (H) 12/28/2020 1354   HDL 26 (L) 12/28/2020 1354   CHOLHDL 3.6 12/28/2020 1354   CHOLHDL 3.4 01/31/2019 0637   VLDL 28 01/31/2019 0637   LDLCALC 28 12/28/2020 1354    Physical Exam:    VS:  BP (!) 154/68   Pulse (!) 54   Ht '5\' 9"'$  (1.753 m)   Wt 166 lb 12.8 oz (75.7 kg)   SpO2 99%   BMI 24.63 kg/m     Wt Readings from Last 3 Encounters:  12/28/21 166 lb 12.8 oz (75.7 kg)  06/22/21 175 lb 6.4 oz (79.6 kg)  12/28/20 177 lb 12.8 oz (80.6 kg)     GEN: He has lost weight and muscle well nourished, well developed in no acute distress HEENT: Normal NECK: No JVD; No carotid bruits LYMPHATICS: No lymphadenopathy CARDIAC: RRR, no murmurs, rubs, gallops RESPIRATORY:  Clear to auscultation without rales, wheezing or rhonchi  ABDOMEN: Soft, non-tender, non-distended MUSCULOSKELETAL:  No edema; No deformity  SKIN: Warm and dry NEUROLOGIC:  Alert and oriented x 3 PSYCHIATRIC:  Normal affect    Signed, Shirlee More, MD  12/28/2021 8:33 AM    [place]

## 2021-12-28 ENCOUNTER — Ambulatory Visit: Payer: PPO | Admitting: Cardiology

## 2021-12-28 ENCOUNTER — Encounter: Payer: Self-pay | Admitting: Cardiology

## 2021-12-28 VITALS — BP 154/68 | HR 54 | Ht 69.0 in | Wt 166.8 lb

## 2021-12-28 DIAGNOSIS — I10 Essential (primary) hypertension: Secondary | ICD-10-CM

## 2021-12-28 DIAGNOSIS — E782 Mixed hyperlipidemia: Secondary | ICD-10-CM

## 2021-12-28 DIAGNOSIS — R5383 Other fatigue: Secondary | ICD-10-CM | POA: Diagnosis not present

## 2021-12-28 DIAGNOSIS — I25708 Atherosclerosis of coronary artery bypass graft(s), unspecified, with other forms of angina pectoris: Secondary | ICD-10-CM

## 2021-12-28 NOTE — Patient Instructions (Signed)
Medication Instructions:  Your physician recommends that you continue on your current medications as directed. Please refer to the Current Medication list given to you today.  *If you need a refill on your cardiac medications before your next appointment, please call your pharmacy*   Lab Work: Your physician recommends that you return for lab work in:   Labs today: CMP, TSH, CBC, Lipid  If you have labs (blood work) drawn today and your tests are completely normal, you will receive your results only by: [place] (if you have [place]) OR A paper copy in the mail If you have any lab test that is abnormal or we need to change your treatment, we will call you to review the results.   Testing/Procedures: None   Follow-Up: At [facility], you and your health needs are our priority.  As part of our continuing mission to provide you with exceptional heart care, we have created designated Provider Care Teams.  These Care Teams include your primary Cardiologist (physician) and Advanced Practice Providers (APPs -  Physician Assistants and Nurse Practitioners) who all work together to provide you with the care you need, when you need it.  We recommend signing up for the patient portal called "MyChart".  Sign up information is provided on this After Visit Summary.  MyChart is used to connect with patients for Virtual Visits (Telemedicine).  Patients are able to view lab/test results, encounter notes, upcoming appointments, etc.  Non-urgent messages can be sent to your provider as well.   To learn more about what you can do with MyChart, go to [email or web address].    Your next appointment:   6 month(s)  The format for your next appointment:   In Person  Provider:   Shirlee More, MD    Other Instructions None  Important Information About [place]

## 2021-12-29 LAB — CBC
Hematocrit: 44.6 % (ref 37.5–51.0)
Hemoglobin: 15.2 g/dL (ref 13.0–17.7)
MCH: 30 pg (ref 26.6–33.0)
MCHC: 34.1 g/dL (ref 31.5–35.7)
MCV: 88 fL (ref 79–97)
Platelets: 106 10*3/uL — ABNORMAL LOW (ref 150–450)
RBC: 5.06 x10E6/uL (ref 4.14–5.80)
RDW: 11.9 % (ref 11.6–15.4)
WBC: 5.4 10*3/uL (ref 3.4–10.8)

## 2021-12-29 LAB — COMPREHENSIVE METABOLIC PANEL
ALT: 19 IU/L (ref 0–44)
AST: 29 IU/L (ref 0–40)
Albumin/Globulin Ratio: 1.8 (ref 1.2–2.2)
Albumin: 4 g/dL (ref 3.7–4.7)
Alkaline Phosphatase: 99 IU/L (ref 44–121)
BUN/Creatinine Ratio: 16 (ref 10–24)
BUN: 14 mg/dL (ref 8–27)
Bilirubin Total: 1.5 mg/dL — ABNORMAL HIGH (ref 0.0–1.2)
CO2: 25 mmol/L (ref 20–29)
Calcium: 8.9 mg/dL (ref 8.6–10.2)
Chloride: 106 mmol/L (ref 96–106)
Creatinine, Ser: 0.86 mg/dL (ref 0.76–1.27)
Globulin, Total: 2.2 g/dL (ref 1.5–4.5)
Glucose: 150 mg/dL — ABNORMAL HIGH (ref 70–99)
Potassium: 4 mmol/L (ref 3.5–5.2)
Sodium: 141 mmol/L (ref 134–144)
Total Protein: 6.2 g/dL (ref 6.0–8.5)
eGFR: 89 mL/min/{1.73_m2} (ref >59)

## 2021-12-29 LAB — LIPID PANEL
Chol/HDL Ratio: 2.8 ratio (ref 0.0–5.0)
Cholesterol, Total: 74 mg/dL — ABNORMAL LOW (ref 100–199)
HDL: 26 mg/dL — ABNORMAL LOW (ref >39)
LDL Chol Calc (NIH): 24 mg/dL (ref 0–99)
Triglycerides: 140 mg/dL (ref 0–149)
VLDL Cholesterol Cal: 24 mg/dL (ref 5–40)

## 2021-12-29 LAB — TSH: TSH: 3.85 u[IU]/mL (ref 0.450–4.500)

## 2022-01-18 DIAGNOSIS — N401 Enlarged prostate with lower urinary tract symptoms: Secondary | ICD-10-CM | POA: Diagnosis not present

## 2022-01-18 DIAGNOSIS — M150 Primary generalized (osteo)arthritis: Secondary | ICD-10-CM | POA: Diagnosis not present

## 2022-01-18 DIAGNOSIS — D518 Other vitamin B12 deficiency anemias: Secondary | ICD-10-CM | POA: Diagnosis not present

## 2022-01-18 DIAGNOSIS — E782 Mixed hyperlipidemia: Secondary | ICD-10-CM | POA: Diagnosis not present

## 2022-01-18 DIAGNOSIS — F411 Generalized anxiety disorder: Secondary | ICD-10-CM | POA: Diagnosis not present

## 2022-01-18 DIAGNOSIS — Z79899 Other long term (current) drug therapy: Secondary | ICD-10-CM | POA: Diagnosis not present

## 2022-01-18 DIAGNOSIS — I10 Essential (primary) hypertension: Secondary | ICD-10-CM | POA: Diagnosis not present

## 2022-01-18 DIAGNOSIS — E559 Vitamin D deficiency, unspecified: Secondary | ICD-10-CM | POA: Diagnosis not present

## 2022-01-18 DIAGNOSIS — R0602 Shortness of breath: Secondary | ICD-10-CM | POA: Diagnosis not present

## 2022-01-18 DIAGNOSIS — E038 Other specified hypothyroidism: Secondary | ICD-10-CM | POA: Diagnosis not present

## 2022-01-18 DIAGNOSIS — E1165 Type 2 diabetes mellitus with hyperglycemia: Secondary | ICD-10-CM | POA: Diagnosis not present

## 2022-01-18 DIAGNOSIS — Z125 Encounter for screening for malignant neoplasm of prostate: Secondary | ICD-10-CM | POA: Diagnosis not present

## 2022-01-18 DIAGNOSIS — Z0000 Encounter for general adult medical examination without abnormal findings: Secondary | ICD-10-CM | POA: Diagnosis not present

## 2022-01-19 DIAGNOSIS — R011 Cardiac murmur, unspecified: Secondary | ICD-10-CM | POA: Diagnosis not present

## 2022-01-20 DIAGNOSIS — I6523 Occlusion and stenosis of bilateral carotid arteries: Secondary | ICD-10-CM | POA: Diagnosis not present

## 2022-01-20 DIAGNOSIS — E782 Mixed hyperlipidemia: Secondary | ICD-10-CM | POA: Diagnosis not present

## 2022-01-20 DIAGNOSIS — R0989 Other specified symptoms and signs involving the circulatory and respiratory systems: Secondary | ICD-10-CM | POA: Diagnosis not present

## 2022-01-20 DIAGNOSIS — E559 Vitamin D deficiency, unspecified: Secondary | ICD-10-CM | POA: Diagnosis not present

## 2022-01-24 DIAGNOSIS — E042 Nontoxic multinodular goiter: Secondary | ICD-10-CM | POA: Diagnosis not present

## 2022-01-24 DIAGNOSIS — R221 Localized swelling, mass and lump, neck: Secondary | ICD-10-CM | POA: Diagnosis not present

## 2022-01-25 DIAGNOSIS — E038 Other specified hypothyroidism: Secondary | ICD-10-CM | POA: Diagnosis not present

## 2022-01-25 DIAGNOSIS — I10 Essential (primary) hypertension: Secondary | ICD-10-CM | POA: Diagnosis not present

## 2022-01-25 DIAGNOSIS — D518 Other vitamin B12 deficiency anemias: Secondary | ICD-10-CM | POA: Diagnosis not present

## 2022-01-25 DIAGNOSIS — E559 Vitamin D deficiency, unspecified: Secondary | ICD-10-CM | POA: Diagnosis not present

## 2022-01-25 DIAGNOSIS — E1165 Type 2 diabetes mellitus with hyperglycemia: Secondary | ICD-10-CM | POA: Diagnosis not present

## 2022-01-25 DIAGNOSIS — M150 Primary generalized (osteo)arthritis: Secondary | ICD-10-CM | POA: Diagnosis not present

## 2022-01-25 DIAGNOSIS — E782 Mixed hyperlipidemia: Secondary | ICD-10-CM | POA: Diagnosis not present

## 2022-01-25 DIAGNOSIS — N401 Enlarged prostate with lower urinary tract symptoms: Secondary | ICD-10-CM | POA: Diagnosis not present

## 2022-01-27 DIAGNOSIS — I77811 Abdominal aortic ectasia: Secondary | ICD-10-CM | POA: Diagnosis not present

## 2022-02-03 DIAGNOSIS — I70223 Atherosclerosis of native arteries of extremities with rest pain, bilateral legs: Secondary | ICD-10-CM | POA: Diagnosis not present

## 2022-02-07 DIAGNOSIS — R2231 Localized swelling, mass and lump, right upper limb: Secondary | ICD-10-CM | POA: Diagnosis not present

## 2022-02-10 DIAGNOSIS — R590 Localized enlarged lymph nodes: Secondary | ICD-10-CM | POA: Diagnosis not present

## 2022-02-21 DIAGNOSIS — E559 Vitamin D deficiency, unspecified: Secondary | ICD-10-CM | POA: Diagnosis not present

## 2022-02-21 DIAGNOSIS — M150 Primary generalized (osteo)arthritis: Secondary | ICD-10-CM | POA: Diagnosis not present

## 2022-02-21 DIAGNOSIS — I10 Essential (primary) hypertension: Secondary | ICD-10-CM | POA: Diagnosis not present

## 2022-02-21 DIAGNOSIS — E782 Mixed hyperlipidemia: Secondary | ICD-10-CM | POA: Diagnosis not present

## 2022-02-21 DIAGNOSIS — E038 Other specified hypothyroidism: Secondary | ICD-10-CM | POA: Diagnosis not present

## 2022-02-21 DIAGNOSIS — N401 Enlarged prostate with lower urinary tract symptoms: Secondary | ICD-10-CM | POA: Diagnosis not present

## 2022-02-21 DIAGNOSIS — E1165 Type 2 diabetes mellitus with hyperglycemia: Secondary | ICD-10-CM | POA: Diagnosis not present

## 2022-02-21 DIAGNOSIS — D518 Other vitamin B12 deficiency anemias: Secondary | ICD-10-CM | POA: Diagnosis not present

## 2022-02-22 DIAGNOSIS — I10 Essential (primary) hypertension: Secondary | ICD-10-CM | POA: Diagnosis not present

## 2022-03-11 DIAGNOSIS — D518 Other vitamin B12 deficiency anemias: Secondary | ICD-10-CM | POA: Diagnosis not present

## 2022-03-11 DIAGNOSIS — E1165 Type 2 diabetes mellitus with hyperglycemia: Secondary | ICD-10-CM | POA: Diagnosis not present

## 2022-03-11 DIAGNOSIS — N401 Enlarged prostate with lower urinary tract symptoms: Secondary | ICD-10-CM | POA: Diagnosis not present

## 2022-03-11 DIAGNOSIS — E559 Vitamin D deficiency, unspecified: Secondary | ICD-10-CM | POA: Diagnosis not present

## 2022-03-11 DIAGNOSIS — E782 Mixed hyperlipidemia: Secondary | ICD-10-CM | POA: Diagnosis not present

## 2022-03-11 DIAGNOSIS — M150 Primary generalized (osteo)arthritis: Secondary | ICD-10-CM | POA: Diagnosis not present

## 2022-03-11 DIAGNOSIS — I10 Essential (primary) hypertension: Secondary | ICD-10-CM | POA: Diagnosis not present

## 2022-03-11 DIAGNOSIS — E038 Other specified hypothyroidism: Secondary | ICD-10-CM | POA: Diagnosis not present

## 2022-03-25 DIAGNOSIS — I10 Essential (primary) hypertension: Secondary | ICD-10-CM | POA: Diagnosis not present

## 2022-04-25 DIAGNOSIS — N401 Enlarged prostate with lower urinary tract symptoms: Secondary | ICD-10-CM | POA: Diagnosis not present

## 2022-04-25 DIAGNOSIS — I10 Essential (primary) hypertension: Secondary | ICD-10-CM | POA: Diagnosis not present

## 2022-04-25 DIAGNOSIS — E782 Mixed hyperlipidemia: Secondary | ICD-10-CM | POA: Diagnosis not present

## 2022-04-25 DIAGNOSIS — E038 Other specified hypothyroidism: Secondary | ICD-10-CM | POA: Diagnosis not present

## 2022-04-25 DIAGNOSIS — E1165 Type 2 diabetes mellitus with hyperglycemia: Secondary | ICD-10-CM | POA: Diagnosis not present

## 2022-04-25 DIAGNOSIS — M150 Primary generalized (osteo)arthritis: Secondary | ICD-10-CM | POA: Diagnosis not present

## 2022-04-25 DIAGNOSIS — E559 Vitamin D deficiency, unspecified: Secondary | ICD-10-CM | POA: Diagnosis not present

## 2022-04-25 DIAGNOSIS — D518 Other vitamin B12 deficiency anemias: Secondary | ICD-10-CM | POA: Diagnosis not present

## 2022-04-26 DIAGNOSIS — I10 Essential (primary) hypertension: Secondary | ICD-10-CM | POA: Diagnosis not present

## 2022-04-29 ENCOUNTER — Other Ambulatory Visit: Payer: Self-pay | Admitting: Cardiology

## 2022-05-10 DIAGNOSIS — M150 Primary generalized (osteo)arthritis: Secondary | ICD-10-CM | POA: Diagnosis not present

## 2022-05-10 DIAGNOSIS — K219 Gastro-esophageal reflux disease without esophagitis: Secondary | ICD-10-CM | POA: Diagnosis not present

## 2022-05-10 DIAGNOSIS — I10 Essential (primary) hypertension: Secondary | ICD-10-CM | POA: Diagnosis not present

## 2022-05-10 DIAGNOSIS — E782 Mixed hyperlipidemia: Secondary | ICD-10-CM | POA: Diagnosis not present

## 2022-05-16 ENCOUNTER — Other Ambulatory Visit: Payer: Self-pay | Admitting: Cardiology

## 2022-05-26 DIAGNOSIS — I10 Essential (primary) hypertension: Secondary | ICD-10-CM | POA: Diagnosis not present

## 2022-05-29 DIAGNOSIS — D518 Other vitamin B12 deficiency anemias: Secondary | ICD-10-CM | POA: Diagnosis not present

## 2022-05-29 DIAGNOSIS — M150 Primary generalized (osteo)arthritis: Secondary | ICD-10-CM | POA: Diagnosis not present

## 2022-05-29 DIAGNOSIS — E559 Vitamin D deficiency, unspecified: Secondary | ICD-10-CM | POA: Diagnosis not present

## 2022-05-29 DIAGNOSIS — I10 Essential (primary) hypertension: Secondary | ICD-10-CM | POA: Diagnosis not present

## 2022-05-29 DIAGNOSIS — E1165 Type 2 diabetes mellitus with hyperglycemia: Secondary | ICD-10-CM | POA: Diagnosis not present

## 2022-05-29 DIAGNOSIS — N401 Enlarged prostate with lower urinary tract symptoms: Secondary | ICD-10-CM | POA: Diagnosis not present

## 2022-05-29 DIAGNOSIS — E038 Other specified hypothyroidism: Secondary | ICD-10-CM | POA: Diagnosis not present

## 2022-05-29 DIAGNOSIS — E782 Mixed hyperlipidemia: Secondary | ICD-10-CM | POA: Diagnosis not present

## 2022-06-08 ENCOUNTER — Other Ambulatory Visit: Payer: Self-pay | Admitting: Cardiology

## 2022-06-26 DIAGNOSIS — I10 Essential (primary) hypertension: Secondary | ICD-10-CM | POA: Diagnosis not present

## 2022-06-28 DIAGNOSIS — E1165 Type 2 diabetes mellitus with hyperglycemia: Secondary | ICD-10-CM | POA: Diagnosis not present

## 2022-06-28 DIAGNOSIS — M150 Primary generalized (osteo)arthritis: Secondary | ICD-10-CM | POA: Diagnosis not present

## 2022-06-28 DIAGNOSIS — D518 Other vitamin B12 deficiency anemias: Secondary | ICD-10-CM | POA: Diagnosis not present

## 2022-06-28 DIAGNOSIS — E559 Vitamin D deficiency, unspecified: Secondary | ICD-10-CM | POA: Diagnosis not present

## 2022-06-28 DIAGNOSIS — E038 Other specified hypothyroidism: Secondary | ICD-10-CM | POA: Diagnosis not present

## 2022-06-28 DIAGNOSIS — E782 Mixed hyperlipidemia: Secondary | ICD-10-CM | POA: Diagnosis not present

## 2022-06-28 DIAGNOSIS — I10 Essential (primary) hypertension: Secondary | ICD-10-CM | POA: Diagnosis not present

## 2022-06-28 DIAGNOSIS — N401 Enlarged prostate with lower urinary tract symptoms: Secondary | ICD-10-CM | POA: Diagnosis not present

## 2022-07-26 DIAGNOSIS — I10 Essential (primary) hypertension: Secondary | ICD-10-CM | POA: Diagnosis not present

## 2022-07-28 DIAGNOSIS — D518 Other vitamin B12 deficiency anemias: Secondary | ICD-10-CM | POA: Diagnosis not present

## 2022-07-28 DIAGNOSIS — N401 Enlarged prostate with lower urinary tract symptoms: Secondary | ICD-10-CM | POA: Diagnosis not present

## 2022-07-28 DIAGNOSIS — E038 Other specified hypothyroidism: Secondary | ICD-10-CM | POA: Diagnosis not present

## 2022-07-28 DIAGNOSIS — E782 Mixed hyperlipidemia: Secondary | ICD-10-CM | POA: Diagnosis not present

## 2022-07-28 DIAGNOSIS — E1165 Type 2 diabetes mellitus with hyperglycemia: Secondary | ICD-10-CM | POA: Diagnosis not present

## 2022-07-28 DIAGNOSIS — I10 Essential (primary) hypertension: Secondary | ICD-10-CM | POA: Diagnosis not present

## 2022-07-28 DIAGNOSIS — E559 Vitamin D deficiency, unspecified: Secondary | ICD-10-CM | POA: Diagnosis not present

## 2022-07-28 DIAGNOSIS — M150 Primary generalized (osteo)arthritis: Secondary | ICD-10-CM | POA: Diagnosis not present

## 2022-08-24 ENCOUNTER — Other Ambulatory Visit: Payer: Self-pay | Admitting: Cardiology

## 2022-08-24 NOTE — Telephone Encounter (Signed)
Rx refill sent to pharmacy. 

## 2022-08-26 DIAGNOSIS — I10 Essential (primary) hypertension: Secondary | ICD-10-CM | POA: Diagnosis not present

## 2022-08-30 DIAGNOSIS — E559 Vitamin D deficiency, unspecified: Secondary | ICD-10-CM | POA: Diagnosis not present

## 2022-08-30 DIAGNOSIS — N401 Enlarged prostate with lower urinary tract symptoms: Secondary | ICD-10-CM | POA: Diagnosis not present

## 2022-08-30 DIAGNOSIS — E782 Mixed hyperlipidemia: Secondary | ICD-10-CM | POA: Diagnosis not present

## 2022-08-30 DIAGNOSIS — I10 Essential (primary) hypertension: Secondary | ICD-10-CM | POA: Diagnosis not present

## 2022-08-30 DIAGNOSIS — E1165 Type 2 diabetes mellitus with hyperglycemia: Secondary | ICD-10-CM | POA: Diagnosis not present

## 2022-08-30 DIAGNOSIS — D518 Other vitamin B12 deficiency anemias: Secondary | ICD-10-CM | POA: Diagnosis not present

## 2022-08-30 DIAGNOSIS — M150 Primary generalized (osteo)arthritis: Secondary | ICD-10-CM | POA: Diagnosis not present

## 2022-08-30 DIAGNOSIS — E038 Other specified hypothyroidism: Secondary | ICD-10-CM | POA: Diagnosis not present

## 2022-09-10 NOTE — Progress Notes (Unsigned)
Cardiology Office Note:    Date:  09/11/2022   ID:  Keith Amble., DOB 08-Feb-1946, MRN [id number]  PCP:  Bonnita Nasuti, MD  Cardiologist:  Shirlee More, MD    Referring MD: Bonnita Nasuti, MD    ASSESSMENT:    1. Coronary artery disease of bypass graft of native heart with stable angina pectoris ([place])   2. S/P CABG x 5   3. Essential hypertension   4. Mixed hyperlipidemia    PLAN:    In order of problems listed above:  Severe CAD with early graft occlusion clinically stable on current medical therapy including aspirin high intensity statin calcium channel blocker beta-blocker and as needed nitroglycerin Controlled continue treatment including the calcium channel blocker Will continue his high intensity statin check lipids and CMP today's   Next appointment: 6 months   Medication Adjustments/Labs and Tests Ordered: Current medicines are reviewed at length with the patient today.  Concerns regarding medicines are outlined above.  Orders Placed This Encounter  Procedures   Comp Met (CMET)   Lipid Profile   EKG 12-Lead   No orders of the defined types were placed in this encounter. FU for CAD   History of Present Illness:    Keith Birky. is a 77 y.o. adult with a hx of CAD with CABG April 2020 with early graft occlusion of all his vein grafts treated medically hypertension hyperlipidemia he had second opinion at [place] recommended TMR he declined and after discussing with [facility] the potential to enter their angiogenesis trial he also declined.  He was last seen 12/28/2021.  He had repeat coronary angiography in 04/20/2021 showing that the left thoracic artery remained patent to the LAD total occlusion of all vein grafts and severe three-vessel coronary artery disease he was referred to CT surgery for consideration of TMR.  He had an echocardiogram [facility] at [facility] on 04/21/2021 ejection fraction normal 55 to 60% right ventricle  normal size and function and no significant valvular heart disease.  He was seen in the emergency room at [facility] 04/19/2021 with chest pain with minor activity and his high-sensitivity troponins were elevated.  Although the emergency room note the discharge diagnosis was non-ST elevation MI.  He was admitted to the hospital and underwent coronary angiography as detailed above.  Compliance with diet, lifestyle and medications: Yes  He is grieving since the death of his mother office but there is element of depression has been taking Xanax prescription at home aspirin for complaint I fell and is beyond the scope of my practice I think depression is an issue and I advised him and his daughter he should be seen by his primary care physician His daughter is supervises him at home is present. He is having frequent angina edema shortness of breath palpitation or syncope    Past Medical History:  Diagnosis Date   Anxiety 06/27/2016   Bilateral lower extremity edema 01/10/2019   CAD (coronary artery disease)    s/p CABGX5 10/2018. LHC 02/03/2019: patent LIMA, all SVG occluded, no target for PCI.    Chest pain 02/12/2019   Coronary artery disease involving native coronary artery of native heart with unstable angina pectoris ([place]) 01/01/2020   Last Assessment & Plan:  Formatting of this note might be different from the original. In summary the patient is a 77 year old man with severe three-vessel coronary artery disease.  Severe coronary artery disease is a life threatening medical problem.  Treatment  of choice is revascularization either by stent or by bypass surgery particularly when there is 3 vessel disease.  In his case redo surger   Coronary artery disease of native artery of native heart with stable angina pectoris ([place]) 12/19/2018   DDD (degenerative disc disease), lumbar 06/27/2016   Diabetes mellitus without complication ([place])    type 2   Dyslexia 06/27/2016   Essential hypertension 12/19/2018    Gastro-esophageal reflux disease without esophagitis 04/19/2021   Hyperlipidemia 12/19/2018   Hyperlipidemia LDL goal <70    Hypertension    Lumbosacral radiculopathy at L4 06/27/2016   Mixed hyperlipidemia 04/19/2021   NSTEMI (non-ST elevated myocardial infarction) ([place]) 11/19/2018   Osteoarthritis of multiple joints 06/27/2016   Prolonged Q-T interval on ECG 04/21/2021   Last Assessment & Plan:  Formatting of this note might be different from the original. Prolonged QT. Repeat EKG today reveals the QTC to be down to 432 milliseconds off Ranexa.  Plan: 1. Continue to hold Ranexa and medications that prolong the QTc.   S/P CABG x 5 11/21/2018   Type 2 diabetes mellitus ([place]) 12/19/2018   Urinary frequency 01/10/2019    Past Surgical History:  Procedure Laterality Date   APPENDECTOMY     BACK SURGERY     CHOLECYSTECTOMY     CORONARY ANGIOPLASTY WITH STENT PLACEMENT  1998   CORONARY ARTERY BYPASS GRAFT N/A 11/21/2018   Procedure: CORONARY ARTERY BYPASS GRAFTING (CABG) TIMES FIVE USING LEFT INTERNAL MAMMARY ARTERY AND LEFT SAPHENOUS VEIN HARVESTED ENDOSCOPICALLY;  Surgeon: Gaye Pollack, MD;  Location: [place];  Service: Open Heart Surgery;  Laterality: N/A;   LEFT HEART CATH AND CORONARY ANGIOGRAPHY N/A 11/19/2018   Procedure: LEFT HEART CATH AND CORONARY ANGIOGRAPHY;  Surgeon: Troy Sine, MD;  Location: [place] CV LAB;  Service: Cardiovascular;  Laterality: N/A;   LEFT HEART CATH AND CORS/GRAFTS ANGIOGRAPHY N/A 02/03/2019   Procedure: LEFT HEART CATH AND CORS/GRAFTS ANGIOGRAPHY;  Surgeon: Nelva Bush, MD;  Location: [place] CV LAB;  Service: Cardiovascular;  Laterality: N/A;   TEE WITHOUT CARDIOVERSION N/A 11/21/2018   Procedure: TRANSESOPHAGEAL ECHOCARDIOGRAM (TEE);  Surgeon: Gaye Pollack, MD;  Location: [place];  Service: Open Heart Surgery;  Laterality: N/A;    Current Medications: Current Meds  Medication Sig   amLODipine (NORVASC) 10 MG tablet TAKE ONE(1) TABLET ONCE DAILY    aspirin EC 81 MG tablet Take 1 tablet (81 mg total) by mouth daily.   cloNIDine (CATAPRES) 0.2 MG tablet TAKE 1 TABLET BY MOUTH EVERY NIGHT AT BEDTIME   isosorbide mononitrate (IMDUR) 60 MG 24 hr tablet Take 3 tablets (180 mg total) by mouth daily.   metoprolol tartrate (LOPRESSOR) 25 MG tablet TAKE ONE TABLET TWICE DAILY   nitroGLYCERIN (NITROSTAT) 0.4 MG SL tablet Place 1 tablet (0.4 mg total) under the tongue every 5 (five) minutes as needed for chest pain.   omeprazole (PRILOSEC) 20 MG capsule Take 20 mg by mouth every morning.   rosuvastatin (CRESTOR) 40 MG tablet Take 1 tablet (40 mg total) by mouth daily.     Allergies:   Atorvastatin, Lipitor [atorvastatin calcium], and Penicillins   Social History   Socioeconomic History   Marital status: Married    Spouse name: Not on file   Number of children: Not on file   Years of education: Not on file   Highest education level: Not on file  Occupational History   Not on file  Tobacco Use   Smoking status: Never  Smokeless tobacco: Former    Types: Chew    Quit date: 2020  Vaping Use   Vaping Use: Never used  Substance and Sexual Activity   Alcohol use: Not Currently   Drug use: Never   Sexual activity: Not Currently  Other Topics Concern   Not on file  Social History Narrative   Not on file   Social Determinants of Health   Financial Resource Strain: Not on file  Food Insecurity: Not on file  Transportation Needs: Not on file  Physical Activity: Not on file  Stress: Not on file  Social Connections: Not on file     Family History: The patient's family history includes Cancer in his sister; Diabetes in his sister; Heart attack in his mother and sister; Stroke in his mother and sister. ROS:   Please see the history of present illness.    All other systems reviewed and are negative.  EKGs/Labs/Other Studies Reviewed:    The following studies were reviewed today:  EKG:  EKG ordered today and personally reviewed.   The ekg ordered today demonstrates sinus bradycardia 54 bpm left anterior hemiblock pattern of infarction  Recent Labs: 12/28/2021: ALT 19; BUN 14; Creatinine, Ser 0.86; Hemoglobin 15.2; Platelets 106; Potassium 4.0; Sodium 141; TSH 3.850  Recent Lipid Panel    Component Value Date/Time   CHOL 74 (L) 12/28/2021 0904   TRIG 140 12/28/2021 0904   HDL 26 (L) 12/28/2021 0904   CHOLHDL 2.8 12/28/2021 0904   CHOLHDL 3.4 01/31/2019 0637   VLDL 28 01/31/2019 0637   LDLCALC 24 12/28/2021 0904    Physical Exam:    VS:  BP 122/60 (BP Location: Right Arm, Patient Position: Sitting)   Pulse (!) 54   Ht 5' 9"$  (1.753 m)   Wt 163 lb (73.9 kg)   SpO2 98%   BMI 24.07 kg/m     Wt Readings from Last 3 Encounters:  09/11/22 163 lb (73.9 kg)  12/28/21 166 lb 12.8 oz (75.7 kg)  06/22/21 175 lb 6.4 oz (79.6 kg)     GEN: He looks sad depressed flat affect well nourished, well developed in no acute distress HEENT: Normal NECK: No JVD; No carotid bruits LYMPHATICS: No lymphadenopathy CARDIAC: RRR, no murmurs, rubs, gallops RESPIRATORY:  Clear to auscultation without rales, wheezing or rhonchi  ABDOMEN: Soft, non-tender, non-distended MUSCULOSKELETAL:  No edema; No deformity  SKIN: Warm and dry NEUROLOGIC:  Alert and oriented x 3 PSYCHIATRIC:  Normal affect   Seen with Jacobo Forest, RN chaperone   Signed, Shirlee More, MD  09/11/2022 8:08 AM    [place]

## 2022-09-11 ENCOUNTER — Encounter: Payer: Self-pay | Admitting: Cardiology

## 2022-09-11 ENCOUNTER — Ambulatory Visit: Payer: PPO | Attending: Cardiology | Admitting: Cardiology

## 2022-09-11 VITALS — BP 122/60 | HR 54 | Ht 69.0 in | Wt 163.0 lb

## 2022-09-11 DIAGNOSIS — E782 Mixed hyperlipidemia: Secondary | ICD-10-CM | POA: Diagnosis not present

## 2022-09-11 DIAGNOSIS — I25708 Atherosclerosis of coronary artery bypass graft(s), unspecified, with other forms of angina pectoris: Secondary | ICD-10-CM

## 2022-09-11 DIAGNOSIS — Z951 Presence of aortocoronary bypass graft: Secondary | ICD-10-CM | POA: Diagnosis not present

## 2022-09-11 DIAGNOSIS — I10 Essential (primary) hypertension: Secondary | ICD-10-CM | POA: Diagnosis not present

## 2022-09-11 NOTE — Patient Instructions (Addendum)
Medication Instructions:  Your physician recommends that you continue on your current medications as directed. Please refer to the Current Medication list given to you today.  *If you need a refill on your cardiac medications before your next appointment, please call your pharmacy*   Lab Work: Your physician recommends that you return for lab work in:   Labs today: CMP, Lipids  If you have labs (blood work) drawn today and your tests are completely normal, you will receive your results only by: [place] (if you have [place]) OR A paper copy in the mail If you have any lab test that is abnormal or we need to change your treatment, we will call you to review the results.   Testing/Procedures: None   Follow-Up: At [facility], you and your health needs are our priority.  As part of our continuing mission to provide you with exceptional heart care, we have created designated Provider Care Teams.  These Care Teams include your primary Cardiologist (physician) and Advanced Practice Providers (APPs -  Physician Assistants and Nurse Practitioners) who all work together to provide you with the care you need, when you need it.  We recommend signing up for the patient portal called "MyChart".  Sign up information is provided on this After Visit Summary.  MyChart is used to connect with patients for Virtual Visits (Telemedicine).  Patients are able to view lab/test results, encounter notes, upcoming appointments, etc.  Non-urgent messages can be sent to your provider as well.   To learn more about what you can do with MyChart, go to [email or web address].    Your next appointment:   6 month(s)  Provider:   Shirlee More, MD    Other Instructions None  Accompanied physician for entire patient appointment.  Suann Larry. Kenton Kingfisher, RN

## 2022-09-12 LAB — LIPID PANEL
Chol/HDL Ratio: 3 ratio (ref 0.0–5.0)
Cholesterol, Total: 74 mg/dL — ABNORMAL LOW (ref 100–199)
HDL: 25 mg/dL — ABNORMAL LOW (ref >39)
LDL Chol Calc (NIH): 15 mg/dL (ref 0–99)
Triglycerides: 217 mg/dL — ABNORMAL HIGH (ref 0–149)
VLDL Cholesterol Cal: 34 mg/dL (ref 5–40)

## 2022-09-12 LAB — COMPREHENSIVE METABOLIC PANEL WITH GFR
ALT: 20 IU/L (ref 0–44)
AST: 26 IU/L (ref 0–40)
Albumin/Globulin Ratio: 2.1 (ref 1.2–2.2)
Albumin: 3.9 g/dL (ref 3.8–4.8)
Alkaline Phosphatase: 95 IU/L (ref 44–121)
BUN/Creatinine Ratio: 23 (ref 10–24)
BUN: 22 mg/dL (ref 8–27)
Bilirubin Total: 0.9 mg/dL (ref 0.0–1.2)
CO2: 20 mmol/L (ref 20–29)
Calcium: 8.6 mg/dL (ref 8.6–10.2)
Chloride: 108 mmol/L — ABNORMAL HIGH (ref 96–106)
Creatinine, Ser: 0.94 mg/dL (ref 0.76–1.27)
Globulin, Total: 1.9 g/dL (ref 1.5–4.5)
Glucose: 181 mg/dL — ABNORMAL HIGH (ref 70–99)
Potassium: 3.9 mmol/L (ref 3.5–5.2)
Sodium: 142 mmol/L (ref 134–144)
Total Protein: 5.8 g/dL — ABNORMAL LOW (ref 6.0–8.5)
eGFR: 83 mL/min/1.73 (ref >59)

## 2022-09-27 DIAGNOSIS — E038 Other specified hypothyroidism: Secondary | ICD-10-CM | POA: Diagnosis not present

## 2022-09-27 DIAGNOSIS — D518 Other vitamin B12 deficiency anemias: Secondary | ICD-10-CM | POA: Diagnosis not present

## 2022-09-27 DIAGNOSIS — E1165 Type 2 diabetes mellitus with hyperglycemia: Secondary | ICD-10-CM | POA: Diagnosis not present

## 2022-09-27 DIAGNOSIS — I10 Essential (primary) hypertension: Secondary | ICD-10-CM | POA: Diagnosis not present

## 2022-09-27 DIAGNOSIS — N401 Enlarged prostate with lower urinary tract symptoms: Secondary | ICD-10-CM | POA: Diagnosis not present

## 2022-09-27 DIAGNOSIS — E782 Mixed hyperlipidemia: Secondary | ICD-10-CM | POA: Diagnosis not present

## 2022-09-27 DIAGNOSIS — M150 Primary generalized (osteo)arthritis: Secondary | ICD-10-CM | POA: Diagnosis not present

## 2022-09-27 DIAGNOSIS — E559 Vitamin D deficiency, unspecified: Secondary | ICD-10-CM | POA: Diagnosis not present

## 2022-10-09 DIAGNOSIS — D518 Other vitamin B12 deficiency anemias: Secondary | ICD-10-CM | POA: Diagnosis not present

## 2022-10-09 DIAGNOSIS — M150 Primary generalized (osteo)arthritis: Secondary | ICD-10-CM | POA: Diagnosis not present

## 2022-10-09 DIAGNOSIS — E559 Vitamin D deficiency, unspecified: Secondary | ICD-10-CM | POA: Diagnosis not present

## 2022-10-09 DIAGNOSIS — I70223 Atherosclerosis of native arteries of extremities with rest pain, bilateral legs: Secondary | ICD-10-CM | POA: Diagnosis not present

## 2022-10-09 DIAGNOSIS — E782 Mixed hyperlipidemia: Secondary | ICD-10-CM | POA: Diagnosis not present

## 2022-10-09 DIAGNOSIS — N401 Enlarged prostate with lower urinary tract symptoms: Secondary | ICD-10-CM | POA: Diagnosis not present

## 2022-10-09 DIAGNOSIS — E038 Other specified hypothyroidism: Secondary | ICD-10-CM | POA: Diagnosis not present

## 2022-10-09 DIAGNOSIS — I10 Essential (primary) hypertension: Secondary | ICD-10-CM | POA: Diagnosis not present

## 2022-10-09 DIAGNOSIS — E1165 Type 2 diabetes mellitus with hyperglycemia: Secondary | ICD-10-CM | POA: Diagnosis not present

## 2022-10-09 DIAGNOSIS — F411 Generalized anxiety disorder: Secondary | ICD-10-CM | POA: Diagnosis not present

## 2022-10-31 DIAGNOSIS — I70223 Atherosclerosis of native arteries of extremities with rest pain, bilateral legs: Secondary | ICD-10-CM | POA: Diagnosis not present

## 2022-10-31 DIAGNOSIS — M150 Primary generalized (osteo)arthritis: Secondary | ICD-10-CM | POA: Diagnosis not present

## 2022-10-31 DIAGNOSIS — D518 Other vitamin B12 deficiency anemias: Secondary | ICD-10-CM | POA: Diagnosis not present

## 2022-10-31 DIAGNOSIS — E1165 Type 2 diabetes mellitus with hyperglycemia: Secondary | ICD-10-CM | POA: Diagnosis not present

## 2022-10-31 DIAGNOSIS — I10 Essential (primary) hypertension: Secondary | ICD-10-CM | POA: Diagnosis not present

## 2022-10-31 DIAGNOSIS — E559 Vitamin D deficiency, unspecified: Secondary | ICD-10-CM | POA: Diagnosis not present

## 2022-10-31 DIAGNOSIS — N401 Enlarged prostate with lower urinary tract symptoms: Secondary | ICD-10-CM | POA: Diagnosis not present

## 2022-10-31 DIAGNOSIS — F411 Generalized anxiety disorder: Secondary | ICD-10-CM | POA: Diagnosis not present

## 2022-10-31 DIAGNOSIS — E038 Other specified hypothyroidism: Secondary | ICD-10-CM | POA: Diagnosis not present

## 2022-10-31 DIAGNOSIS — E782 Mixed hyperlipidemia: Secondary | ICD-10-CM | POA: Diagnosis not present

## 2022-12-05 ENCOUNTER — Other Ambulatory Visit: Payer: Self-pay | Admitting: Cardiology

## 2022-12-07 DIAGNOSIS — I70223 Atherosclerosis of native arteries of extremities with rest pain, bilateral legs: Secondary | ICD-10-CM | POA: Diagnosis not present

## 2022-12-07 DIAGNOSIS — E1165 Type 2 diabetes mellitus with hyperglycemia: Secondary | ICD-10-CM | POA: Diagnosis not present

## 2022-12-07 DIAGNOSIS — E782 Mixed hyperlipidemia: Secondary | ICD-10-CM | POA: Diagnosis not present

## 2022-12-07 DIAGNOSIS — E038 Other specified hypothyroidism: Secondary | ICD-10-CM | POA: Diagnosis not present

## 2022-12-07 DIAGNOSIS — I10 Essential (primary) hypertension: Secondary | ICD-10-CM | POA: Diagnosis not present

## 2022-12-07 DIAGNOSIS — D518 Other vitamin B12 deficiency anemias: Secondary | ICD-10-CM | POA: Diagnosis not present

## 2022-12-07 DIAGNOSIS — E559 Vitamin D deficiency, unspecified: Secondary | ICD-10-CM | POA: Diagnosis not present

## 2022-12-07 DIAGNOSIS — N401 Enlarged prostate with lower urinary tract symptoms: Secondary | ICD-10-CM | POA: Diagnosis not present

## 2022-12-07 DIAGNOSIS — M150 Primary generalized (osteo)arthritis: Secondary | ICD-10-CM | POA: Diagnosis not present

## 2022-12-07 DIAGNOSIS — F411 Generalized anxiety disorder: Secondary | ICD-10-CM | POA: Diagnosis not present

## 2022-12-31 DIAGNOSIS — R61 Generalized hyperhidrosis: Secondary | ICD-10-CM | POA: Diagnosis not present

## 2022-12-31 DIAGNOSIS — J189 Pneumonia, unspecified organism: Secondary | ICD-10-CM | POA: Diagnosis not present

## 2022-12-31 DIAGNOSIS — R0600 Dyspnea, unspecified: Secondary | ICD-10-CM | POA: Diagnosis not present

## 2023-01-12 DIAGNOSIS — K219 Gastro-esophageal reflux disease without esophagitis: Secondary | ICD-10-CM | POA: Diagnosis not present

## 2023-01-12 DIAGNOSIS — I2510 Atherosclerotic heart disease of native coronary artery without angina pectoris: Secondary | ICD-10-CM | POA: Diagnosis not present

## 2023-01-12 DIAGNOSIS — R5383 Other fatigue: Secondary | ICD-10-CM | POA: Diagnosis not present

## 2023-01-12 DIAGNOSIS — E1159 Type 2 diabetes mellitus with other circulatory complications: Secondary | ICD-10-CM | POA: Diagnosis not present

## 2023-01-12 DIAGNOSIS — M159 Polyosteoarthritis, unspecified: Secondary | ICD-10-CM | POA: Diagnosis not present

## 2023-01-12 DIAGNOSIS — I10 Essential (primary) hypertension: Secondary | ICD-10-CM | POA: Diagnosis not present

## 2023-01-12 DIAGNOSIS — E7800 Pure hypercholesterolemia, unspecified: Secondary | ICD-10-CM | POA: Diagnosis not present

## 2023-01-12 DIAGNOSIS — Z6824 Body mass index (BMI) 24.0-24.9, adult: Secondary | ICD-10-CM | POA: Diagnosis not present

## 2023-01-12 DIAGNOSIS — J069 Acute upper respiratory infection, unspecified: Secondary | ICD-10-CM | POA: Diagnosis not present

## 2023-03-10 ENCOUNTER — Other Ambulatory Visit: Payer: Self-pay | Admitting: Cardiology

## 2023-04-07 IMAGING — CT CT CTA ABD/PEL W/CM AND/OR W/O CM
2 of 9 series · 9 of 46 positions shown, 15 images · IV contrast (Omni 300)
Comparison: 01/20/2016

CLINICAL DATA: Acute worsening left lower quadrant abdominal pain,
concern for mesenteric ischemia

EXAM:
CT ANGIOGRAPHY ABDOMEN AND PELVIS WITH CONTRAST AND WITHOUT CONTRAST
TECHNIQUE: Multidetector CT imaging of the abdomen and pelvis was performed
using the standard protocol during bolus administration of
intravenous contrast. Multiplanar reconstructed images and MIPs were
obtained and reviewed to evaluate the vascular anatomy.
CONTRAST:  100mL OMNIPAQUE IOHEXOL 350 MG/ML SOLN

[Series 7: renal cta cor · coronal · 0.71mm/px · 2 of 149 slices shown, 3 images]
[im 50/149  soft-tissue]
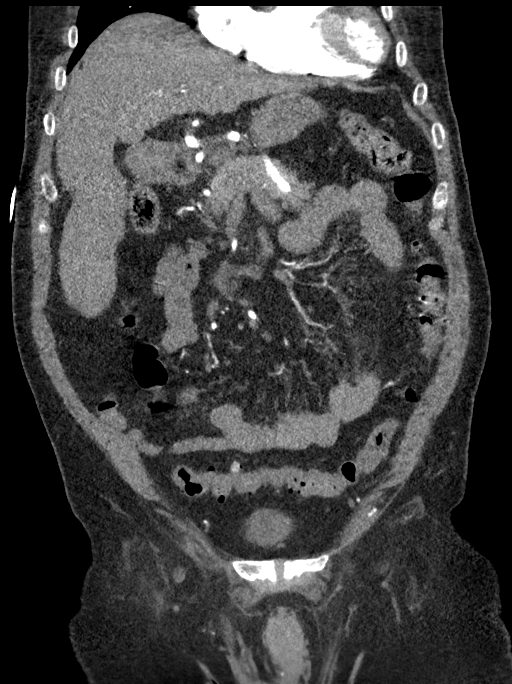
[im 50/149  bone]
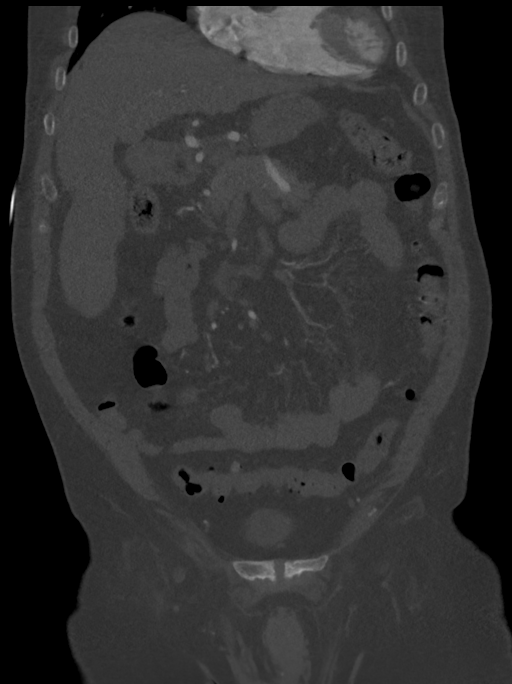
[im 99/149  soft-tissue]
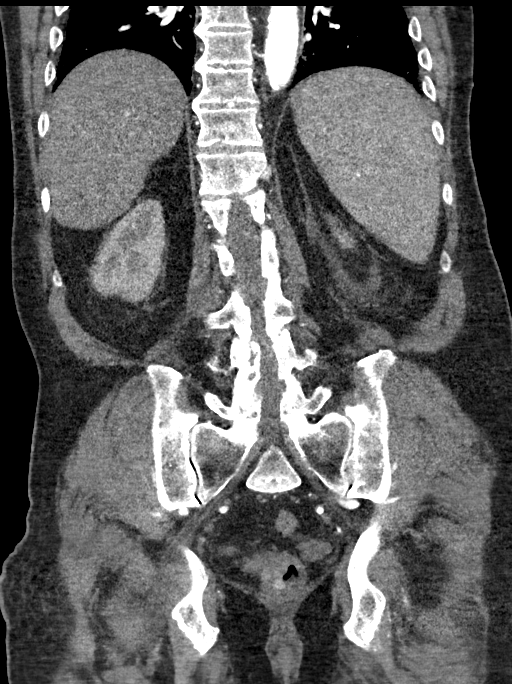

[Series 11: venous 5.0 · axial · portal-venous · 0.82mm/px · z∈[-434,-74]mm · 7 of 97 slices shown, 12 images]
[im 13/97  soft-tissue]
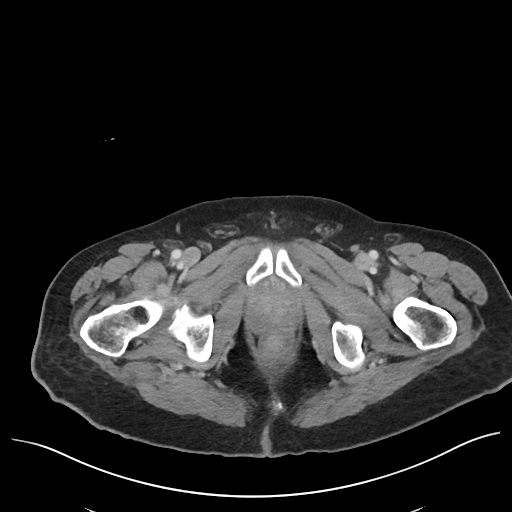
[im 13/97  bone]
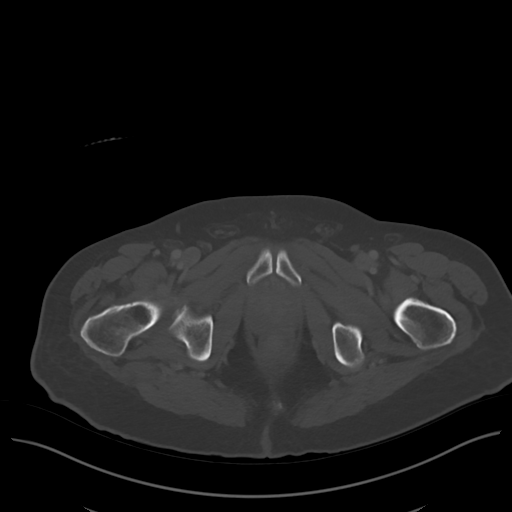
[im 25/97  soft-tissue]
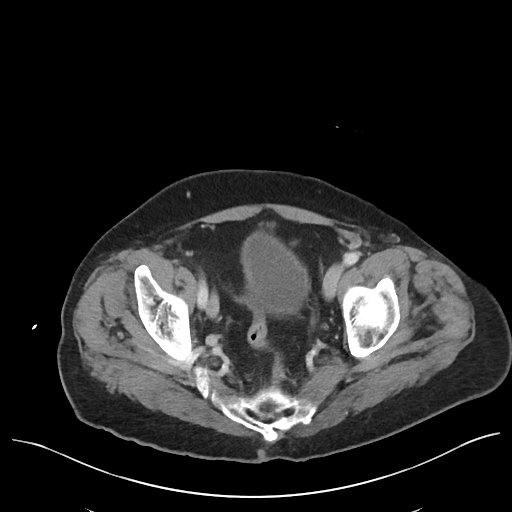
[im 37/97  soft-tissue]
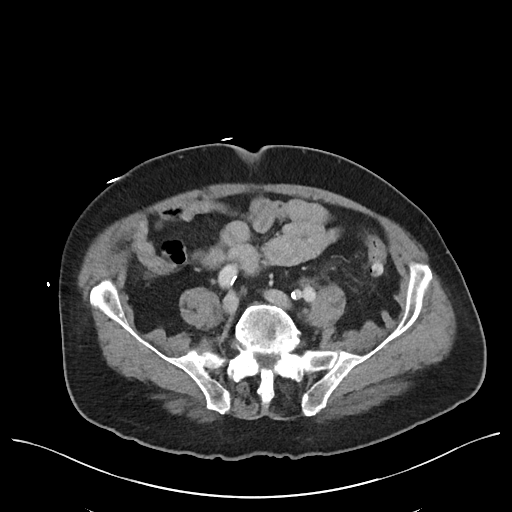
[im 49/97  soft-tissue]
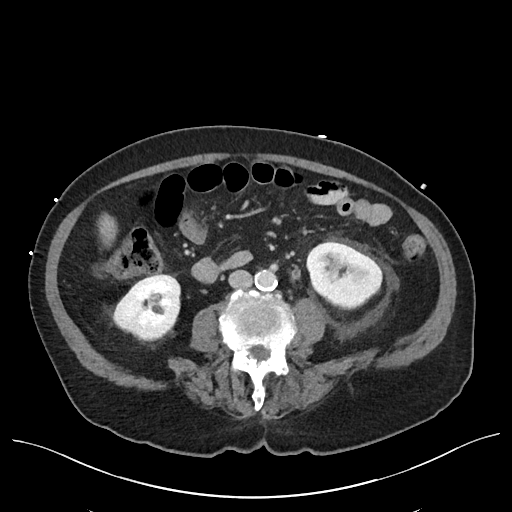
[im 49/97  lung]
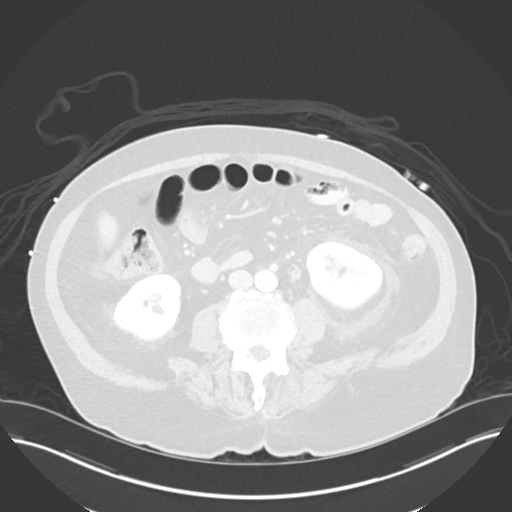
[im 61/97  soft-tissue]
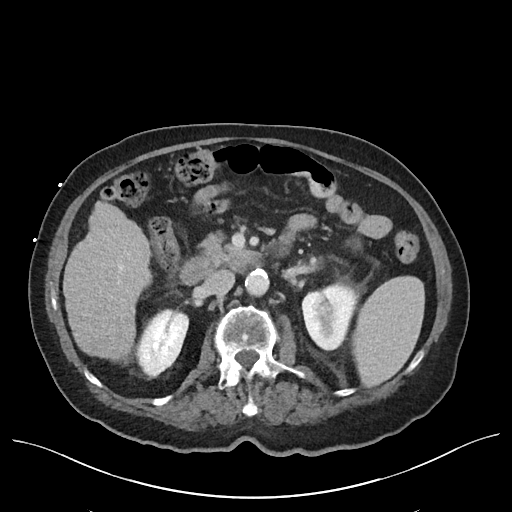
[im 61/97  lung]
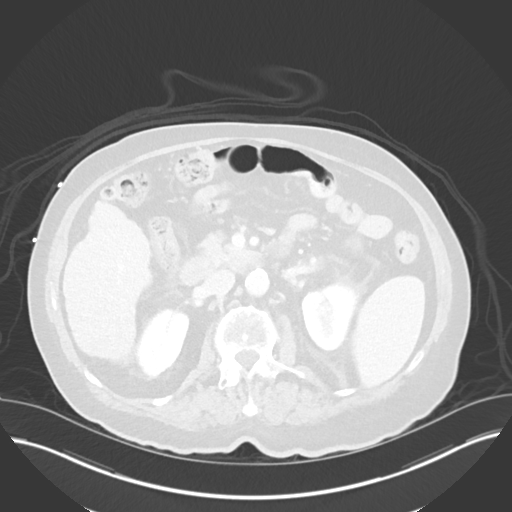
[im 73/97  soft-tissue]
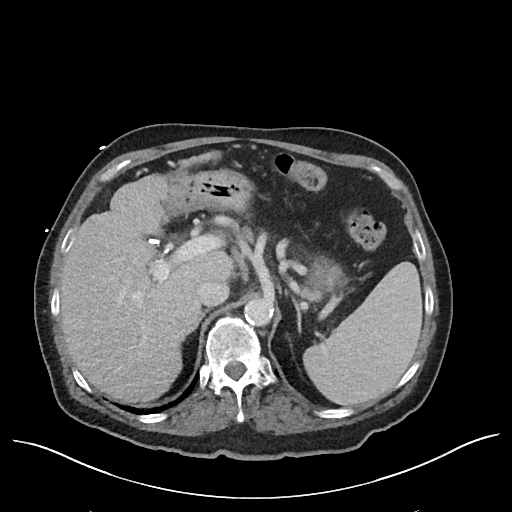
[im 73/97  lung]
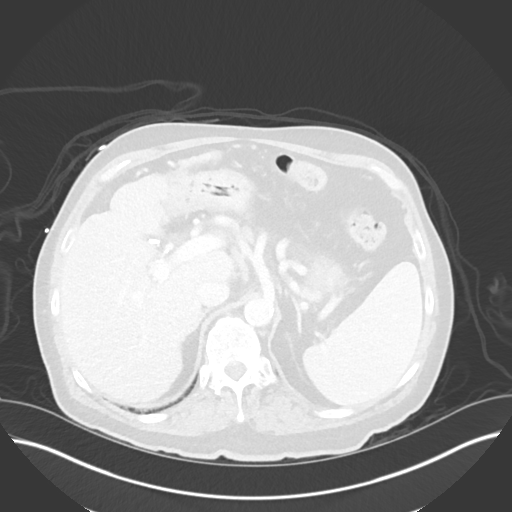
[im 85/97  soft-tissue]
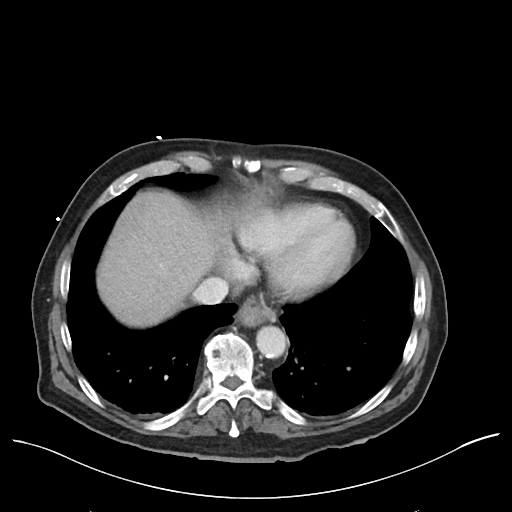
[im 85/97  lung]
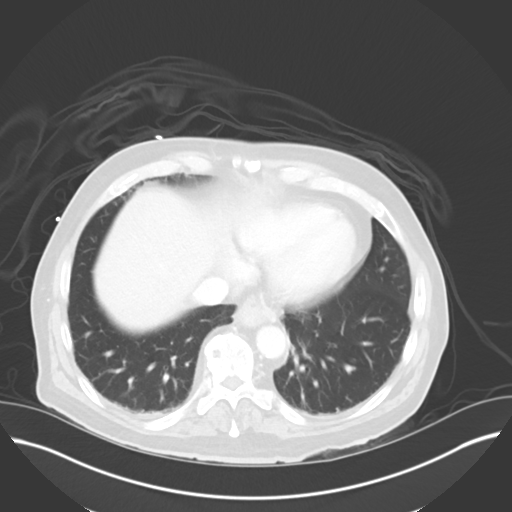

[9 of 46 positions shown; findings below may reference images not displayed]

FINDINGS: VASCULAR

Aorta: Aortic atherosclerosis without aneurysm, dissection,
occlusive process or acute rupture. No retroperitoneal hemorrhage or
hematoma.

Celiac: Widely patent origin including its branches

SMA: Widely patent origin including its branches in the central
mesentery

Renals: Atherosclerotic origins but remain patent. No accessory
renal artery appreciated.

IMA: Remains patent off the distal aorta including its branches.

Inflow: Minor atherosclerotic change and tortuosity without inflow
disease or occlusion. Common, internal and external iliac arteries
all remain patent.

Proximal Outflow: Atherosclerotic changes but the common femoral,
proximal profunda femoral, proximal superficial femoral arteries all
remain patent.

Veins: No Tes process.

Review of the MIP images confirms the above findings.

NON-VASCULAR

Lower chest: Minor dependent basilar atelectasis. Mild cardiac
enlargement. No pericardial or pleural effusion. Small hiatal
hernia.

Hepatobiliary: Anterior liver demonstrates surface nodularity
compatible with a component of hepatic cirrhosis. No biliary
dilatation. No focal hepatic abnormality. Hepatic and portal veins
are patent. Remote cholecystectomy. Stable dilatation of the common
bile duct, suspect postcholecystectomy related.

Pancreas: Unremarkable. No pancreatic ductal dilatation or
surrounding inflammatory changes.

Spleen: Spleen is enlarged measuring 15 cm in length. No focal
abnormality.

Adrenals/Urinary Tract: Normal adrenal glands.

Right kidney and ureter demonstrate lower pole cyst. No renal
obstruction or hydronephrosis. No hydroureter. No right ureteral
calculus.

Left kidney demonstrates mild perinephric strandy edema/inflammation
and mild left hydroureteronephrosis. This is secondary to an
obstructing 4 mm left mid ureteral calculus as the ureter crosses
the iliac vessels. This is confirmed on the coronal and sagittal
reconstructions. Below this calculus, the left ureter is mildly
prominent to the left UVJ. There is also a left mid to lower pole
anterior hypodense cyst measuring 4.2 cm.

Bladder unremarkable.

Stomach/Bowel: Small hiatal hernia. Very small early esophageal
varices noted. Negative for bowel obstruction, dilatation, ileus, or
free air. Appendix not visualized. No acute inflammatory process in
the right lower quadrant. Scattered colonic diverticulosis. No acute
inflammatory process.

No free fluid, fluid collection, hemorrhage, hematoma, abscess or
ascites.

Lymphatic: No adenopathy.

Reproductive: Mild prostate enlargement. Prominent seminal vesicles.
No acute finding by CT.

Other: Small fat containing umbilical hernia.

Musculoskeletal: Degenerative changes throughout the spine. Slight
scoliosis. No acute osseous finding. Preserved vertebral body
heights.
IMPRESSION: VASCULAR

Aortoiliac atherosclerosis without acute vascular process.
Mesenteric and renal vasculature all appear patent. No evidence of
mesenteric vascular occlusive disease.

NON-VASCULAR

4 mm left mid ureteral mildly obstructing calculus with associated
left hydroureteronephrosis and perinephric inflammation/edema.

Hepatic cirrhosis with evidence of portal hypertension with
associated splenomegaly and small early esophageal varices.

Small hiatal hernia

Remote cholecystectomy

Colonic diverticulosis

## 2023-05-08 ENCOUNTER — Other Ambulatory Visit: Payer: Self-pay | Admitting: Cardiology

## 2023-05-08 NOTE — Telephone Encounter (Signed)
Rx refill sent to pharmacy. 

## 2023-07-30 ENCOUNTER — Telehealth: Payer: Self-pay | Admitting: Cardiology

## 2023-07-30 NOTE — Telephone Encounter (Signed)
   Pt c/o of Chest Pain: STAT if active CP, including tightness, pressure, jaw pain, radiating pain to shoulder/upper arm/back, CP unrelieved by Nitro. Symptoms reported of SOB, nausea, vomiting, sweating.  1. Are you having CP right now? No    2. Are you experiencing any other symptoms (ex. SOB, nausea, vomiting, sweating)? Dizziness    3. Is your CP continuous or coming and going? Coming and going   4. Have you taken Nitroglycerin? Yes   5. How long have you been experiencing CP? yesterday    6. If NO CP at time of call then end call with telling Pt to call back or call 911 if Chest pain returns prior to return call from triage team.

## 2023-07-30 NOTE — Telephone Encounter (Signed)
Patient daughter Elita Quick is calling reporting that the patient has reported he felt like he has had "mini heart attacks over the last two days". Per daughter he has had chest heaviness and chest tightness along with arm pain and weakness. Pam reports that patient has taken Nitroglycerin with no relief. Daughter was advised for patient to get medical attention, as he may be having a stroke or a heart attack. Daughter stated that the patient refused to go to ER and wants to make an appointment to see someone in the [place] office. Daughter made aware that our recommendation is for the patient to go to ER. Pam stated that she would talk to the patient regarding going to the ER but he would most likely refuse.  Appointment made for 08/08/2023

## 2023-08-07 ENCOUNTER — Other Ambulatory Visit: Payer: Self-pay | Admitting: Cardiology

## 2023-08-08 ENCOUNTER — Ambulatory Visit: Payer: PPO

## 2023-08-08 VITALS — BP 130/60 | HR 62 | Ht 69.0 in | Wt 159.2 lb

## 2023-08-08 DIAGNOSIS — I25118 Atherosclerotic heart disease of native coronary artery with other forms of angina pectoris: Secondary | ICD-10-CM | POA: Diagnosis not present

## 2023-08-08 DIAGNOSIS — R079 Chest pain, unspecified: Secondary | ICD-10-CM | POA: Diagnosis not present

## 2023-08-08 MED ORDER — ISOSORBIDE MONONITRATE ER 30 MG PO TB24: 60.0000 mg | ORAL_TABLET | Freq: Every day | ORAL | 3 refills | Status: DC

## 2023-08-08 MED ORDER — ISOSORBIDE MONONITRATE ER 120 MG PO TB24: 120.0000 mg | ORAL_TABLET | Freq: Every day | ORAL | 3 refills | Status: AC

## 2023-08-08 MED ORDER — RANOLAZINE ER 500 MG PO TB12: 500.0000 mg | ORAL_TABLET | Freq: Two times a day (BID) | ORAL | 3 refills | Status: DC

## 2023-08-08 NOTE — Patient Instructions (Signed)
 Medication Instructions:  Your physician has recommended you make the following change in your medication:   START: Ranexa  500 mg two times daily START: Imdur  120 mg daily  *If you need a refill on your cardiac medications before your next appointment, please call your pharmacy*   Lab Work: None If you have labs (blood work) drawn today and your tests are completely normal, you will receive your results only by: MyChart Message (if you have MyChart) OR A paper copy in the mail If you have any lab test that is abnormal or we need to change your treatment, we will call you to review the results.   Testing/Procedures: Your physician has requested that you have an echocardiogram. Echocardiography is a painless test that uses sound waves to create images of your heart. It provides your doctor with information about the size and shape of your heart and how well your heart's chambers and valves are working. This procedure takes approximately one hour. There are no restrictions for this procedure. Please do NOT wear cologne, perfume, aftershave, or lotions (deodorant is allowed). Please arrive 15 minutes prior to your appointment time.  Please note: We ask at that you not bring children with you during ultrasound (echo/ vascular) testing. Due to room size and safety concerns, children are not allowed in the ultrasound rooms during exams. Our front office staff cannot provide observation of children in our lobby area while testing is being conducted. An adult accompanying a patient to their appointment will only be allowed in the ultrasound room at the discretion of the ultrasound technician under special circumstances. We apologize for any inconvenience.    Follow-Up: At [facility], you and your health needs are our priority.  As part of our continuing mission to provide you with exceptional heart care, we have created designated Provider Care Teams.  These Care Teams include your  primary Cardiologist (physician) and Advanced Practice Providers (APPs -  Physician Assistants and Nurse Practitioners) who all work together to provide you with the care you need, when you need it.  We recommend signing up for the patient portal called MyChart.  Sign up information is provided on this After Visit Summary.  MyChart is used to connect with patients for Virtual Visits (Telemedicine).  Patients are able to view lab/test results, encounter notes, upcoming appointments, etc.  Non-urgent messages can be sent to your provider as well.   To learn more about what you can do with MyChart, go to [email or web address].    Your next appointment:   4 week(s)  Provider:   Alean Kobus, MD    Other Instructions None

## 2023-08-08 NOTE — Assessment & Plan Note (Addendum)
 Extensive history of native vessel disease of the coronaries, unfortunate situation with failure of venous grafts within months after the CABG. Last in September 2022 at [facility] showed patent LIMA-LAD with distal LAD disease post anastomosis.  Extensive disease of the native vessels.  Now with symptoms of angina on exertion.  No symptoms at rest.  Continue with antianginal regimen and escalate therapy Continue amlodipine  10 mg once daily Educated about isosorbide  mononitrate, and switch the dose to 120 mg 1 time in the morning. Continue metoprolol  tartrate 25 mg twice daily. Add Ranexa  500 mg twice daily. Will follow-up in the office in 1 month.  If his symptoms progress or having symptoms at rest advised him to call 911 or go to the ER.  Educated about ways to use sublingual nitroglycerin , if he has to use 2 sublingual nitroglycerin  pills within 5 minutes at symptoms persist he should head to the ER or call 911. Advised him to abstain from driving himself in that situation.   Unfortunately he does not have any good targets given severe native vessel disease. Will try to escalate his antianginal regimen. Will obtain transthoracic echocardiogram to rule out any significant LV dysfunction or changes. Depending on his follow-up visit, we will revisit any further need for ischemic workup by noninvasive versus invasive methods.

## 2023-08-08 NOTE — Progress Notes (Signed)
 Cardiology Consultation:    Date:  08/08/2023   ID:  Isaiah KATHEE Nancey Mickey., DOB 05-10-1946, MRN [id number]  PCP:  Jama Chow, MD  Cardiologist:  Alean SAUNDERS Sulamita Lafountain, MD   Referring MD: Dawayne Kerney SQUIBB, MD   Chief Complaint  Patient presents with   Chest Pain     ASSESSMENT AND PLAN:   Mr Chesnut 78 year old male with history of severe coronary artery disease s/p 5 vessel bypass in April 2020 with early occlusion of all venous grafts observed by cath in July 2020, last cath in September 2022 with similar disease, fortunately with stable biventricular function and no significant valve abnormality on last echocardiogram September 2022, also has hypertension and hyperlipidemia now presenting with symptoms of chest discomfort with exertion over the last couple months.  No symptoms at rest.  No significant EKG changes.  Problem List Items Addressed This Visit     Coronary artery disease of native artery of native heart with stable angina pectoris (HCC)   Extensive history of native vessel disease of the coronaries, unfortunate situation with failure of venous grafts within months after the CABG. Last in September 2022 at [facility] showed patent LIMA-LAD with distal LAD disease post anastomosis.  Extensive disease of the native vessels.  Now with symptoms of angina on exertion.  No symptoms at rest.  Continue with antianginal regimen and escalate therapy Continue amlodipine  10 mg once daily Educated about isosorbide  mononitrate, and switch the dose to 120 mg 1 time in the morning. Continue metoprolol  tartrate 25 mg twice daily. Add Ranexa  500 mg twice daily. Will follow-up in the office in 1 month.  If his symptoms progress or having symptoms at rest advised him to call 911 or go to the ER.  Educated about ways to use sublingual nitroglycerin , if he has to use 2 sublingual nitroglycerin  pills within 5 minutes at symptoms persist he should head to the ER or call  911. Advised him to abstain from driving himself in that situation.         Relevant Medications   ranolazine  (RANEXA ) 500 MG 12 hr tablet   isosorbide  mononitrate (IMDUR ) 120 MG 24 hr tablet   Chest pain - Primary   Relevant Orders   EKG 12-Lead (Completed)   ECHOCARDIOGRAM COMPLETE      History of Present Illness:    Casy Brunetto. is a 78 y.o. adult who is being seen today for follow-up visit. PCP is Hague, Kerney SQUIBB, MD. Last visit at our office was with Dr. Monetta 09-11-2022. Here for follow-up visit as he reached out to our office on 07-30-2023 for symptoms of chest discomfort and declined to go to the ER.  Pleasant man here for the visit accompanied by his daughter.  He himself is illiterate and unable to read labels.  He lives at home with his grandson who helps manage his medications.  Has history of CAD, s/p CABG 5 vessel (LIMA-LAD, SVG-diagonal, SVG-OM1, sequential SVG graft between OM 2 and L PDA] in  April 2020, subsequently in July 2020 Showed occluded venous grafts with patent LIMA-LAD but apical LAD with severe stenosis beyond the anastomosis.  Last cardiac cath was September 2022 at [facility] in the setting of NSTEMI at [facility] showed  patent LIMA-LAD but occluded venous grafts.  Native LCx with diffuse disease, RCA chronically obstructed at its origin.  Her ramus small vessel with diffuse disease. Also has history of hypertension, hyperlipidemia.  Mentions over  the past couple months has noticed symptoms of chest discomfort and left upper extremity pain with exertion.  Denies any symptoms at rest. Mentions he has been taking isosorbide  mononitrate 60 mg 2 times a day.  This was prescribed for antianginal effect by Dr. Monetta  to be taken 60 mg tablets 3 pills 1 time in the morning.  He also reports episodes of falls, which based on his description appear to be mechanical but loss of balance and tripping over stuff.  He sometimes is not able  to recollect the events leading to the fall.  Reports 5 falls in the past 6 months.  Able to pull himself up.  Denies any significant injuries.  He has been dealing with grief of loss of his wife last January.  They were married for 60 years.  Both him and his daughter got emotional talking about their loss.  EKG in the clinic today shows sinus rhythm heart rate 62/min, PR interval 164 ms, QRS 90 ms with left anterior fascicle block morphology.  T wave inversion in lead I and aVL.  In comparison to prior EKG from February 01, 2019 T wave changes in lateral precordial leads are less prominent.  Last echocardiogram to review is from September 2022 LVEF 55 to 60%, hypokinesis of interventricular septum consistent with prior surgery and CABG, normal RV function, no significant valve abnormality.   Last lipid panel 09-11-2022 total cholesterol 74, triglycerides 217, HDL 25, LDL 15. Past Medical History:  Diagnosis Date   Anxiety 06/27/2016   Bilateral lower extremity edema 01/10/2019   CAD (coronary artery disease)    s/p CABGX5 10/2018. LHC 02/03/2019: patent LIMA, all SVG occluded, no target for PCI.    Chest pain 02/12/2019   Coronary artery disease involving native coronary artery of native heart with unstable angina pectoris (HCC) 01/01/2020   Last Assessment & Plan:  Formatting of this note might be different from the original. In summary the patient is a 78 year old man with severe three-vessel coronary artery disease.  Severe coronary artery disease is a life threatening medical problem.  Treatment of choice is revascularization either by stent or by bypass surgery particularly when there is 3 vessel disease.  In his case redo surger   Coronary artery disease of native artery of native heart with stable angina pectoris (HCC) 12/19/2018   DDD (degenerative disc disease), lumbar 06/27/2016   Diabetes mellitus without complication (HCC)    type 2   Dyslexia 06/27/2016   Essential hypertension 12/19/2018    Gastro-esophageal reflux disease without esophagitis 04/19/2021   Hyperlipidemia 12/19/2018   Hyperlipidemia LDL goal <70    Hypertension    Lumbosacral radiculopathy at L4 06/27/2016   Mixed hyperlipidemia 04/19/2021   NSTEMI (non-ST elevated myocardial infarction) (HCC) 11/19/2018   Osteoarthritis of multiple joints 06/27/2016   Prolonged Q-T interval on ECG 04/21/2021   Last Assessment & Plan:  Formatting of this note might be different from the original. Prolonged QT. Repeat EKG today reveals the QTC to be down to 432 milliseconds off Ranexa .  Plan: 1. Continue to hold Ranexa  and medications that prolong the QTc.   S/P CABG x 5 11/21/2018   Type 2 diabetes mellitus (HCC) 12/19/2018   Urinary frequency 01/10/2019    Past Surgical History:  Procedure Laterality Date   APPENDECTOMY     BACK SURGERY     CHOLECYSTECTOMY     CORONARY ANGIOPLASTY WITH STENT PLACEMENT  1998   CORONARY ARTERY BYPASS GRAFT N/A 11/21/2018  Procedure: CORONARY ARTERY BYPASS GRAFTING (CABG) TIMES FIVE USING LEFT INTERNAL MAMMARY ARTERY AND LEFT SAPHENOUS VEIN HARVESTED ENDOSCOPICALLY;  Surgeon: Lucas Dorise POUR, MD;  Location: [facility] OR;  Service: Open Heart Surgery;  Laterality: N/A;   LEFT HEART CATH AND CORONARY ANGIOGRAPHY N/A 11/19/2018   Procedure: LEFT HEART CATH AND CORONARY ANGIOGRAPHY;  Surgeon: Burnard Debby LABOR, MD;  Location: [facility] INVASIVE CV LAB;  Service: Cardiovascular;  Laterality: N/A;   LEFT HEART CATH AND CORS/GRAFTS ANGIOGRAPHY N/A 02/03/2019   Procedure: LEFT HEART CATH AND CORS/GRAFTS ANGIOGRAPHY;  Surgeon: Mady Bruckner, MD;  Location: [facility] INVASIVE CV LAB;  Service: Cardiovascular;  Laterality: N/A;   TEE WITHOUT CARDIOVERSION N/A 11/21/2018   Procedure: TRANSESOPHAGEAL ECHOCARDIOGRAM (TEE);  Surgeon: Lucas Dorise POUR, MD;  Location: [facility] OR;  Service: Open Heart Surgery;  Laterality: N/A;    Current Medications: Current Meds  Medication Sig   amLODipine  (NORVASC ) 10 MG tablet Take 1 tablet (10 mg total)  by mouth daily.   aspirin  EC 81 MG tablet Take 1 tablet (81 mg total) by mouth daily.   cloNIDine  (CATAPRES ) 0.2 MG tablet Take 1 tablet (0.2 mg total) by mouth at bedtime.   isosorbide  mononitrate (IMDUR ) 120 MG 24 hr tablet Take 1 tablet (120 mg total) by mouth daily.   metoprolol  tartrate (LOPRESSOR ) 25 MG tablet TAKE 1 TABLET BY MOUTH TWICE DAILY   nitroGLYCERIN  (NITROSTAT ) 0.4 MG SL tablet Place 1 tablet (0.4 mg total) under the tongue every 5 (five) minutes as needed for chest pain.   omeprazole (PRILOSEC) 20 MG capsule Take 20 mg by mouth every morning.   ranolazine  (RANEXA ) 500 MG 12 hr tablet Take 1 tablet (500 mg total) by mouth 2 (two) times daily.   rosuvastatin  (CRESTOR ) 40 MG tablet Take 1 tablet (40 mg total) by mouth daily.   [DISCONTINUED] isosorbide  mononitrate (IMDUR ) 30 MG 24 hr tablet Take 2 tablets (60 mg total) by mouth daily after breakfast.   [DISCONTINUED] isosorbide  mononitrate (IMDUR ) 60 MG 24 hr tablet Take 3 tablets (180 mg total) by mouth daily.     Allergies:   Atorvastatin , Lipitor  [atorvastatin  calcium ], and Penicillins   Social History   Socioeconomic History   Marital status: Married    Spouse name: Not on file   Number of children: Not on file   Years of education: Not on file   Highest education level: Not on file  Occupational History   Not on file  Tobacco Use   Smoking status: Never   Smokeless tobacco: Former    Types: Chew    Quit date: 2020  Vaping Use   Vaping status: Never Used  Substance and Sexual Activity   Alcohol use: Not Currently   Drug use: Never   Sexual activity: Not Currently  Other Topics Concern   Not on file  Social History Narrative   Not on file   Social Drivers of Health   Financial Resource Strain: Not on file  Food Insecurity: Not on file  Transportation Needs: Not on file  Physical Activity: Not on file  Stress: Not on file  Social Connections: Not on file     Family History: The patient's family  history includes Cancer in his sister; Diabetes in his sister; Heart attack in his mother and sister; Stroke in his mother and sister. ROS:   Please see the history of present illness.    All 14 point review of systems negative except as described per history of present illness.  EKGs/Labs/Other Studies  Reviewed:    The following studies were reviewed today:   EKG:  EKG Interpretation Date/Time:  Wednesday August 08 2023 15:20:49 EST Ventricular Rate:  62 PR Interval:  164 QRS Duration:  98 QT Interval:  448 QTC Calculation: 454 R Axis:   -66  Text Interpretation: Normal sinus rhythm Left anterior fascicular block Minimal voltage criteria for LVH, may be normal variant ST & T wave abnormality, consider lateral ischemia Abnormal ECG When compared with ECG of 01-Feb-2019 06:47, T wave inversion less evident in Lateral leads Confirmed by Liborio Hai reddy [phone]) on 08/08/2023 3:45:35 PM    Recent Labs: 09/11/2022: ALT 20; BUN 22; Creatinine, Ser 0.94; Potassium 3.9; Sodium 142  Recent Lipid Panel    Component Value Date/Time   CHOL 74 (L) 09/11/2022 0813   TRIG 217 (H) 09/11/2022 0813   HDL 25 (L) 09/11/2022 0813   CHOLHDL 3.0 09/11/2022 0813   CHOLHDL 3.4 01/31/2019 0637   VLDL 28 01/31/2019 0637   LDLCALC 15 09/11/2022 0813    Physical Exam:    VS:  BP 130/60 (BP Location: Right Arm, Patient Position: Sitting, Cuff Size: Normal)   Pulse 62   Ht 5' 9 (1.753 m)   Wt 159 lb 3.2 oz (72.2 kg)   SpO2 97%   BMI 23.51 kg/m     Wt Readings from Last 3 Encounters:  08/08/23 159 lb 3.2 oz (72.2 kg)  09/11/22 163 lb (73.9 kg)  12/28/21 166 lb 12.8 oz (75.7 kg)     GENERAL:  Well nourished, well developed in no acute distress NECK: No JVD; No carotid bruits CARDIAC: RRR, S1 and S2 present, no murmurs, no rubs, no gallops CHEST:  Clear to auscultation without rales, wheezing or rhonchi  Extremities: No pitting pedal edema. Pulses bilaterally symmetric with radial 2+  and dorsalis pedis 2+ NEUROLOGIC:  Alert and oriented x 3  Medication Adjustments/Labs and Tests Ordered: Current medicines are reviewed at length with the patient today.  Concerns regarding medicines are outlined above.  Orders Placed This Encounter  Procedures   EKG 12-Lead   ECHOCARDIOGRAM COMPLETE   Meds ordered this encounter  Medications   ranolazine  (RANEXA ) 500 MG 12 hr tablet    Sig: Take 1 tablet (500 mg total) by mouth 2 (two) times daily.    Dispense:  180 tablet    Refill:  3   DISCONTD: isosorbide  mononitrate (IMDUR ) 30 MG 24 hr tablet    Sig: Take 2 tablets (60 mg total) by mouth daily after breakfast.    Dispense:  180 tablet    Refill:  3   isosorbide  mononitrate (IMDUR ) 120 MG 24 hr tablet    Sig: Take 1 tablet (120 mg total) by mouth daily.    Dispense:  90 tablet    Refill:  3    Signed, Kyandre Okray reddy Frederich Montilla, MD, MPH, [facility]. 08/08/2023 4:43 PM    [facility]

## 2023-08-28 ENCOUNTER — Ambulatory Visit: Payer: PPO

## 2023-09-01 ENCOUNTER — Other Ambulatory Visit: Payer: Self-pay | Admitting: Cardiology

## 2023-09-05 ENCOUNTER — Ambulatory Visit: Payer: PPO

## 2023-09-08 ENCOUNTER — Other Ambulatory Visit: Payer: Self-pay | Admitting: Cardiology

## 2023-09-10 NOTE — Telephone Encounter (Signed)
 Prescription sent to pharmacy.

## 2023-10-01 ENCOUNTER — Other Ambulatory Visit: Payer: Self-pay | Admitting: Cardiology

## 2023-11-17 ENCOUNTER — Other Ambulatory Visit: Payer: Self-pay

## 2023-11-19 NOTE — Telephone Encounter (Signed)
 Rx refill sent to pharmacy.

## 2023-11-28 ENCOUNTER — Other Ambulatory Visit: Payer: Self-pay

## 2023-11-28 ENCOUNTER — Other Ambulatory Visit: Payer: Self-pay | Admitting: Cardiology

## 2024-01-31 DIAGNOSIS — E1159 Type 2 diabetes mellitus with other circulatory complications: Secondary | ICD-10-CM | POA: Diagnosis not present

## 2024-01-31 DIAGNOSIS — M159 Polyosteoarthritis, unspecified: Secondary | ICD-10-CM | POA: Diagnosis not present

## 2024-01-31 DIAGNOSIS — I10 Essential (primary) hypertension: Secondary | ICD-10-CM | POA: Diagnosis not present

## 2024-01-31 DIAGNOSIS — I2510 Atherosclerotic heart disease of native coronary artery without angina pectoris: Secondary | ICD-10-CM | POA: Diagnosis not present

## 2024-01-31 DIAGNOSIS — Z6824 Body mass index (BMI) 24.0-24.9, adult: Secondary | ICD-10-CM | POA: Diagnosis not present

## 2024-01-31 DIAGNOSIS — E7800 Pure hypercholesterolemia, unspecified: Secondary | ICD-10-CM | POA: Diagnosis not present

## 2024-01-31 DIAGNOSIS — R5383 Other fatigue: Secondary | ICD-10-CM | POA: Diagnosis not present

## 2024-01-31 DIAGNOSIS — K219 Gastro-esophageal reflux disease without esophagitis: Secondary | ICD-10-CM | POA: Diagnosis not present

## 2024-02-28 DIAGNOSIS — D693 Immune thrombocytopenic purpura: Secondary | ICD-10-CM | POA: Diagnosis not present

## 2024-02-28 DIAGNOSIS — E7800 Pure hypercholesterolemia, unspecified: Secondary | ICD-10-CM | POA: Diagnosis not present

## 2024-02-28 DIAGNOSIS — I10 Essential (primary) hypertension: Secondary | ICD-10-CM | POA: Diagnosis not present

## 2024-02-28 DIAGNOSIS — K219 Gastro-esophageal reflux disease without esophagitis: Secondary | ICD-10-CM | POA: Diagnosis not present

## 2024-02-28 DIAGNOSIS — Z6822 Body mass index (BMI) 22.0-22.9, adult: Secondary | ICD-10-CM | POA: Diagnosis not present

## 2024-02-28 DIAGNOSIS — I2510 Atherosclerotic heart disease of native coronary artery without angina pectoris: Secondary | ICD-10-CM | POA: Diagnosis not present

## 2024-02-28 DIAGNOSIS — M159 Polyosteoarthritis, unspecified: Secondary | ICD-10-CM | POA: Diagnosis not present

## 2024-02-28 DIAGNOSIS — E1159 Type 2 diabetes mellitus with other circulatory complications: Secondary | ICD-10-CM | POA: Diagnosis not present

## 2024-03-04 ENCOUNTER — Other Ambulatory Visit: Payer: Self-pay | Admitting: Hematology and Oncology

## 2024-03-04 DIAGNOSIS — D696 Thrombocytopenia, unspecified: Secondary | ICD-10-CM

## 2024-03-05 ENCOUNTER — Inpatient Hospital Stay

## 2024-03-05 ENCOUNTER — Inpatient Hospital Stay: Attending: Hematology and Oncology | Admitting: Hematology and Oncology

## 2024-03-05 ENCOUNTER — Encounter: Payer: Self-pay | Admitting: Hematology and Oncology

## 2024-03-05 ENCOUNTER — Other Ambulatory Visit: Payer: Self-pay

## 2024-03-05 ENCOUNTER — Telehealth: Payer: Self-pay | Admitting: Hematology and Oncology

## 2024-03-05 VITALS — BP 138/70 | HR 50 | Temp 97.6°F | Resp 16 | Ht 69.0 in | Wt 152.8 lb

## 2024-03-05 DIAGNOSIS — H9191 Unspecified hearing loss, right ear: Secondary | ICD-10-CM | POA: Insufficient documentation

## 2024-03-05 DIAGNOSIS — R059 Cough, unspecified: Secondary | ICD-10-CM | POA: Diagnosis not present

## 2024-03-05 DIAGNOSIS — I2510 Atherosclerotic heart disease of native coronary artery without angina pectoris: Secondary | ICD-10-CM | POA: Diagnosis not present

## 2024-03-05 DIAGNOSIS — R531 Weakness: Secondary | ICD-10-CM | POA: Insufficient documentation

## 2024-03-05 DIAGNOSIS — R0602 Shortness of breath: Secondary | ICD-10-CM | POA: Insufficient documentation

## 2024-03-05 DIAGNOSIS — I252 Old myocardial infarction: Secondary | ICD-10-CM | POA: Insufficient documentation

## 2024-03-05 DIAGNOSIS — I10 Essential (primary) hypertension: Secondary | ICD-10-CM | POA: Diagnosis not present

## 2024-03-05 DIAGNOSIS — D696 Thrombocytopenia, unspecified: Secondary | ICD-10-CM | POA: Diagnosis not present

## 2024-03-05 DIAGNOSIS — R079 Chest pain, unspecified: Secondary | ICD-10-CM | POA: Diagnosis not present

## 2024-03-05 DIAGNOSIS — M549 Dorsalgia, unspecified: Secondary | ICD-10-CM | POA: Diagnosis not present

## 2024-03-05 DIAGNOSIS — Z951 Presence of aortocoronary bypass graft: Secondary | ICD-10-CM | POA: Insufficient documentation

## 2024-03-05 DIAGNOSIS — R197 Diarrhea, unspecified: Secondary | ICD-10-CM | POA: Insufficient documentation

## 2024-03-05 LAB — CBC WITH DIFFERENTIAL (CANCER CENTER ONLY)
Abs Immature Granulocytes: 0.03 K/uL (ref 0.00–0.07)
Basophils Absolute: 0 K/uL (ref 0.0–0.1)
Basophils Relative: 1 %
Eosinophils Absolute: 0.3 K/uL (ref 0.0–0.5)
Eosinophils Relative: 7 %
HCT: 39.7 % (ref 39.0–52.0)
Hemoglobin: 13.6 g/dL (ref 13.0–17.0)
Immature Granulocytes: 1 %
Lymphocytes Relative: 24 %
Lymphs Abs: 1.2 K/uL (ref 0.7–4.0)
MCH: 29.8 pg (ref 26.0–34.0)
MCHC: 34.3 g/dL (ref 30.0–36.0)
MCV: 87.1 fL (ref 80.0–100.0)
Monocytes Absolute: 0.4 K/uL (ref 0.1–1.0)
Monocytes Relative: 7 %
Neutro Abs: 3 K/uL (ref 1.7–7.7)
Neutrophils Relative %: 60 %
Platelet Count: 89 K/uL — ABNORMAL LOW (ref 150–400)
RBC: 4.56 MIL/uL (ref 4.22–5.81)
RDW: 12.3 % (ref 11.5–15.5)
WBC Count: 5 K/uL (ref 4.0–10.5)
nRBC: 0 % (ref 0.0–0.2)

## 2024-03-05 LAB — VITAMIN B12: Vitamin B-12: 161 pg/mL — ABNORMAL LOW (ref 180–914)

## 2024-03-05 LAB — CMP (CANCER CENTER ONLY)
ALT: 24 U/L (ref 0–44)
AST: 38 U/L (ref 15–41)
Albumin: 3.9 g/dL (ref 3.5–5.0)
Alkaline Phosphatase: 100 U/L (ref 38–126)
Anion gap: 10 (ref 5–15)
BUN: 14 mg/dL (ref 8–23)
CO2: 24 mmol/L (ref 22–32)
Calcium: 8.9 mg/dL (ref 8.9–10.3)
Chloride: 107 mmol/L (ref 98–111)
Creatinine: 0.85 mg/dL (ref 0.61–1.24)
GFR, Estimated: >60 mL/min (ref >60)
Glucose, Bld: 139 mg/dL — ABNORMAL HIGH (ref 70–99)
Potassium: 4.1 mmol/L (ref 3.5–5.1)
Sodium: 141 mmol/L (ref 135–145)
Total Bilirubin: 1.3 mg/dL — ABNORMAL HIGH (ref 0.0–1.2)
Total Protein: 6.4 g/dL — ABNORMAL LOW (ref 6.5–8.1)

## 2024-03-05 LAB — TSH: TSH: 3.542 u[IU]/mL (ref 0.350–4.500)

## 2024-03-05 LAB — FOLATE: Folate: 15.9 ng/mL (ref >5.9)

## 2024-03-05 NOTE — Telephone Encounter (Signed)
 Patient has been scheduled for follow-up visit per 03/05/24 LOS.  Pt given an appt calendar with date and time.

## 2024-03-05 NOTE — Progress Notes (Signed)
 [facility] [address] [place],  [place]  [address] [phone]  Clinic Day:  03/05/2024   Referring physician: Jama Chow, MD  Patient Care Team: Patient Care Team: Keith Chow, MD as PCP - General (Internal Medicine) Keith Redell PARAS, MD as PCP - Cardiology (Cardiology)   REASON FOR CONSULTATION:  Thrombocytopenia  HISTORY OF PRESENT ILLNESS:   Keith Lucero. is a 78 y.o. adult with a history of thrombocytopenia who is referred in consultation by [name], MD for assessment and management. He presents today with his daughter. He has had mild thrombocytopenia dating back to 2020. He does not believe he has had any workup for this, but does state he feels he has had a decline in his health since heart surgery in April 2020. He underwent CABG x 5. He also notes significant weight loss since his surgery and notes that over the last few years, his appetite has decreased. He recently lost his wife of 60 years in January and his grief is still evident and he notes this also makes it difficult for him to do day to day activities. He does have hearing and vision loss, but is unable to afford hearing aids or glasses. He notes shortness of breath and chest discomfort that radiates to his arm. This has been noted in the cardiologist note as well, so he is managing. He reports frequent diarrhea. He did have a colonoscopy in the past, unsure of when and was told to follow up, but did not. He denies fever, chills, nausea or vomiting. He denies issue with bladder. Medical history includes anxiety, CAD, chest pain, degenerative disc disease, Type 2 DM, dyslexia, hypertension, hyperlipidemia and GERD. Surgical history includes appendectomy, cholecystectomy, CABG x 5 and heart cath. Family history includes cancer, diabetes, heart attack and stroke. He is a widower and lives with his grandson. He formerly used smokeless tobacco (chew), but denies smoking, use of alcohol or other drugs.     REVIEW OF SYSTEMS:   Review of Systems  Constitutional:  Positive for appetite change and unexpected weight change.  HENT:   Positive for hearing loss.   Eyes:  Positive for eye problems.  Respiratory:  Positive for cough and shortness of breath.   Cardiovascular:  Positive for chest pain.  Gastrointestinal:  Positive for diarrhea.  Endocrine: Negative.   Genitourinary: Negative.    Musculoskeletal:  Positive for back pain and gait problem.  Skin: Negative.   Neurological:  Positive for extremity weakness and gait problem.  Hematological:  Bruises/bleeds easily.  Psychiatric/Behavioral:  The patient is nervous/anxious.      VITALS:   Blood pressure 138/70, pulse (!) 50, temperature 97.6 F (36.4 C), temperature source Oral, resp. rate 16, height 5' 9 (1.753 m), weight 152 lb 12.8 oz (69.3 kg), SpO2 100%.  Wt Readings from Last 3 Encounters:  03/05/24 152 lb 12.8 oz (69.3 kg)  08/08/23 159 lb 3.2 oz (72.2 kg)  09/11/22 163 lb (73.9 kg)    Body mass index is 22.56 kg/m.  Performance status (ECOG): 1 - Symptomatic but completely ambulatory  PHYSICAL EXAM:   Physical Exam Constitutional:      Appearance: Normal appearance.  HENT:     Head: Normocephalic and atraumatic.     Mouth/Throat:     Mouth: Mucous membranes are moist.  Cardiovascular:     Rate and Rhythm: Normal rate and regular rhythm.     Pulses: Normal pulses.     Heart sounds: Normal  heart sounds.  Pulmonary:     Effort: Pulmonary effort is normal.     Breath sounds: Normal breath sounds.  Abdominal:     General: Abdomen is flat. Bowel sounds are normal.     Palpations: Abdomen is soft.  Musculoskeletal:        General: Normal range of motion.     Cervical back: Normal range of motion.  Skin:    General: Skin is warm and dry.  Neurological:     General: No focal deficit present.     Mental Status: He is alert and oriented to person, place, and time. Mental status is at baseline.  Psychiatric:         Mood and Affect: Mood normal.        Behavior: Behavior normal.        Thought Content: Thought content normal.        Judgment: Judgment normal.      LABS:      Latest Ref Rng & Units 03/05/2024    8:17 AM 12/28/2021    9:04 AM 07/08/2021   10:13 AM  CBC  WBC 4.0 - 10.5 K/uL 5.0  5.4  13.3   Hemoglobin 13.0 - 17.0 g/dL 86.3  84.7  83.4   Hematocrit 39.0 - 52.0 % 39.7  44.6  47.9   Platelets 150 - 400 K/uL 89  106  144       Latest Ref Rng & Units 03/05/2024    8:17 AM 09/11/2022    8:13 AM 12/28/2021    9:04 AM  CMP  Glucose 70 - 99 mg/dL 860  818  849   BUN 8 - 23 mg/dL 14  22  14    Creatinine 0.61 - 1.24 mg/dL 9.14  9.05  9.13   Sodium 135 - 145 mmol/L 141  142  141   Potassium 3.5 - 5.1 mmol/L 4.1  3.9  4.0   Chloride 98 - 111 mmol/L 107  108  106   CO2 22 - 32 mmol/L 24  20  25    Calcium  8.9 - 10.3 mg/dL 8.9  8.6  8.9   Total Protein 6.5 - 8.1 g/dL 6.4  5.8  6.2   Total Bilirubin 0.0 - 1.2 mg/dL 1.3  0.9  1.5   Alkaline Phos 38 - 126 U/L 100  95  99   AST 15 - 41 U/L 38  26  29   ALT 0 - 44 U/L 24  20  19       No results found for: CEA1, CEA / No results found for: CEA1, CEA No results found for: PSA1 No results found for: CAN199 No results found for: CAN125  No results found for: TOTALPROTELP, ALBUMINELP, A1GS, A2GS, BETS, BETA2SER, GAMS, MSPIKE, SPEI No results found for: TIBC, FERRITIN, IRONPCTSAT No results found for: LDH  STUDIES:   No results found.    HISTORY:   Past Medical History:  Diagnosis Date   Anxiety 06/27/2016   Bilateral lower extremity edema 01/10/2019   CAD (coronary artery disease)    s/p CABGX5 10/2018. LHC 02/03/2019: patent LIMA, all SVG occluded, no target for PCI.    Chest pain 02/12/2019   Coronary artery disease involving native coronary artery of native heart with unstable angina pectoris (HCC) 01/01/2020   Last Assessment & Plan:  Formatting of this note might be different from the  original. In summary the patient is a 78 year old man with severe three-vessel coronary artery disease.  Severe coronary  artery disease is a life threatening medical problem.  Treatment of choice is revascularization either by stent or by bypass surgery particularly when there is 3 vessel disease.  In his case redo surger   Coronary artery disease of native artery of native heart with stable angina pectoris (HCC) 12/19/2018   DDD (degenerative disc disease), lumbar 06/27/2016   Diabetes mellitus without complication (HCC)    type 2   Dyslexia 06/27/2016   Essential hypertension 12/19/2018   Gastro-esophageal reflux disease without esophagitis 04/19/2021   Hyperlipidemia 12/19/2018   Hyperlipidemia LDL goal <70    Hypertension    Lumbosacral radiculopathy at L4 06/27/2016   Mixed hyperlipidemia 04/19/2021   NSTEMI (non-ST elevated myocardial infarction) (HCC) 11/19/2018   Osteoarthritis of multiple joints 06/27/2016   Prolonged Q-T interval on ECG 04/21/2021   Last Assessment & Plan:  Formatting of this note might be different from the original. Prolonged QT. Repeat EKG today reveals the QTC to be down to 432 milliseconds off Ranexa .  Plan: 1. Continue to hold Ranexa  and medications that prolong the QTc.   S/P CABG x 5 11/21/2018   Type 2 diabetes mellitus (HCC) 12/19/2018   Urinary frequency 01/10/2019    Past Surgical History:  Procedure Laterality Date   APPENDECTOMY     BACK SURGERY     CHOLECYSTECTOMY     CORONARY ANGIOPLASTY WITH STENT PLACEMENT  1998   CORONARY ARTERY BYPASS GRAFT N/A 11/21/2018   Procedure: CORONARY ARTERY BYPASS GRAFTING (CABG) TIMES FIVE USING LEFT INTERNAL MAMMARY ARTERY AND LEFT SAPHENOUS VEIN HARVESTED ENDOSCOPICALLY;  Surgeon: Lucas Dorise POUR, MD;  Location: [facility] OR;  Service: Open Heart Surgery;  Laterality: N/A;   LEFT HEART CATH AND CORONARY ANGIOGRAPHY N/A 11/19/2018   Procedure: LEFT HEART CATH AND CORONARY ANGIOGRAPHY;  Surgeon: Burnard Debby LABOR, MD;  Location: [facility]  INVASIVE CV LAB;  Service: Cardiovascular;  Laterality: N/A;   LEFT HEART CATH AND CORS/GRAFTS ANGIOGRAPHY N/A 02/03/2019   Procedure: LEFT HEART CATH AND CORS/GRAFTS ANGIOGRAPHY;  Surgeon: Mady Bruckner, MD;  Location: [facility] INVASIVE CV LAB;  Service: Cardiovascular;  Laterality: N/A;   TEE WITHOUT CARDIOVERSION N/A 11/21/2018   Procedure: TRANSESOPHAGEAL ECHOCARDIOGRAM (TEE);  Surgeon: Lucas Dorise POUR, MD;  Location[facility] OR;  Service: Open Heart Surgery;  Laterality: N/A;    Family History  Problem Relation Age of Onset   Heart attack Mother    Stroke Mother    Diabetes Sister    Heart attack Sister    Stroke Sister    Cancer Sister     Social History:  reports that he has never smoked. He quit smokeless tobacco use about 5 years ago.  His smokeless tobacco use included chew. He reports that he does not currently use alcohol. He reports that he does not use drugs.The patient is accompanied by daughter today.  Allergies:  Allergies  Allergen Reactions   Atorvastatin  Other (See Comments)    Pt states he gets sick to his stomach and nauseous.    Lipitor  [Atorvastatin  Calcium ] Diarrhea   Penicillins Itching    Did it involve swelling of the face/tongue/throat, SOB, or low BP? Unknown Did it involve sudden or severe rash/hives, skin peeling, or any reaction on the inside of your mouth or nose? Unknown Did you need to seek medical attention at a hospital or doctor's office? Unknown When did it last happen?      UNK If all above answers are "NO", may proceed with cephalosporin use.     Current  Medications: Current Outpatient Medications  Medication Sig Dispense Refill   amLODipine  (NORVASC ) 10 MG tablet TAKE 1 TABLET(10 MG) BY MOUTH DAILY 90 tablet 3   aspirin  EC 81 MG tablet Take 1 tablet (81 mg total) by mouth daily. 90 tablet 3   cloNIDine  (CATAPRES ) 0.2 MG tablet Take 1 tablet (0.2 mg total) by mouth at bedtime. 90 tablet 2   isosorbide  mononitrate (IMDUR ) 120 MG 24 hr tablet Take  1 tablet (120 mg total) by mouth daily. 90 tablet 3   metoprolol  tartrate (LOPRESSOR ) 25 MG tablet TAKE 1 TABLET BY MOUTH TWICE DAILY 180 tablet 2   nitroGLYCERIN  (NITROSTAT ) 0.4 MG SL tablet Place 1 tablet (0.4 mg total) under the tongue every 5 (five) minutes as needed for chest pain. 25 tablet 5   omeprazole (PRILOSEC) 20 MG capsule Take 20 mg by mouth every morning.     ranolazine  (RANEXA ) 500 MG 12 hr tablet Take 1 tablet (500 mg total) by mouth 2 (two) times daily. 180 tablet 2   rosuvastatin  (CRESTOR ) 40 MG tablet TAKE 1 TABLET(40 MG) BY MOUTH DAILY 90 tablet 3   No current facility-administered medications for this visit.     ASSESSMENT & PLAN:   Assessment:  Keith Lucero. is a 78 y.o. adult with long term history of mild thrombocytopenia as far back to at least 2020. He did undergo CABG x 5 in April 2020 and has found recovery to be difficult. He lost significant weight after surgery and has not been able to gain any back. He notes his appetite has decreased, especially since he lost his wife in January. They were married for 60 years. He lives with his grandson who helps to manage his medications. He tries to be very independent in his daily activities but is recently noticing instability when ambulating and has had several falls. He has access to a cane and walker, but is hesitant to use them. I urged him to try again as this may help him keep some of his independence. We discussed his visit today and that we are looking for any underlying condition that may be causing his thrombocytopenia. Although it is mild with CBC revealing platelet count 89 today, I did explain to him that he could be at increased risk for bleeds if he has a bad fall. I also encouraged him to continue close follow up with cardiology as he is experiencing chest discomfort for the last few months. Cardiology is aware of this.   Plan: 1.  Return to clinic in 2 weeks to discuss pending results and treatment  planning if needed.   I discussed the assessment and treatment plan with the patient.  The patient was provided an opportunity to ask questions and all were answered.  The patient agreed with the plan and demonstrated an understanding of the instructions.    Thank you for the referral    45 minutes was spent in patient care.  This included time spent preparing to see the patient (e.g., review of tests), obtaining and/or reviewing separately obtained history, counseling and educating the patient/family/caregiver, ordering medications, tests, or procedures; documenting clinical information in the electronic or other health record, independently interpreting results and communicating results to the patient/family/caregiver as well as coordination of care.      Eleanor DELENA Bach, NP   Family Nurse Practitioner - Board Certified [facility] [phone]

## 2024-03-18 ENCOUNTER — Other Ambulatory Visit: Payer: Self-pay | Admitting: Hematology and Oncology

## 2024-03-18 DIAGNOSIS — D696 Thrombocytopenia, unspecified: Secondary | ICD-10-CM

## 2024-03-19 ENCOUNTER — Inpatient Hospital Stay (HOSPITAL_BASED_OUTPATIENT_CLINIC_OR_DEPARTMENT_OTHER): Admitting: Hematology and Oncology

## 2024-03-19 ENCOUNTER — Inpatient Hospital Stay

## 2024-03-19 ENCOUNTER — Other Ambulatory Visit: Payer: Self-pay

## 2024-03-19 VITALS — BP 153/74 | HR 58 | Temp 97.9°F | Resp 14 | Ht 69.0 in | Wt 152.4 lb

## 2024-03-19 DIAGNOSIS — D696 Thrombocytopenia, unspecified: Secondary | ICD-10-CM

## 2024-03-19 LAB — CMP (CANCER CENTER ONLY)
ALT: 20 U/L (ref 0–44)
AST: 32 U/L (ref 15–41)
Albumin: 4.2 g/dL (ref 3.5–5.0)
Alkaline Phosphatase: 94 U/L (ref 38–126)
Anion gap: 13 (ref 5–15)
BUN: 14 mg/dL (ref 8–23)
CO2: 23 mmol/L (ref 22–32)
Calcium: 9.2 mg/dL (ref 8.9–10.3)
Chloride: 106 mmol/L (ref 98–111)
Creatinine: 0.84 mg/dL (ref 0.61–1.24)
GFR, Estimated: >60 mL/min (ref >60)
Glucose, Bld: 162 mg/dL — ABNORMAL HIGH (ref 70–99)
Potassium: 4.1 mmol/L (ref 3.5–5.1)
Sodium: 142 mmol/L (ref 135–145)
Total Bilirubin: 1.3 mg/dL — ABNORMAL HIGH (ref 0.0–1.2)
Total Protein: 6.9 g/dL (ref 6.5–8.1)

## 2024-03-19 LAB — CBC WITH DIFFERENTIAL (CANCER CENTER ONLY)
Abs Immature Granulocytes: 0.01 K/uL (ref 0.00–0.07)
Basophils Absolute: 0.1 K/uL (ref 0.0–0.1)
Basophils Relative: 1 %
Eosinophils Absolute: 0.3 K/uL (ref 0.0–0.5)
Eosinophils Relative: 5 %
HCT: 42.3 % (ref 39.0–52.0)
Hemoglobin: 14.4 g/dL (ref 13.0–17.0)
Immature Granulocytes: 0 %
Lymphocytes Relative: 19 %
Lymphs Abs: 1.2 K/uL (ref 0.7–4.0)
MCH: 29.6 pg (ref 26.0–34.0)
MCHC: 34 g/dL (ref 30.0–36.0)
MCV: 86.9 fL (ref 80.0–100.0)
Monocytes Absolute: 0.6 K/uL (ref 0.1–1.0)
Monocytes Relative: 9 %
Neutro Abs: 4.4 K/uL (ref 1.7–7.7)
Neutrophils Relative %: 66 %
Platelet Count: 96 K/uL — ABNORMAL LOW (ref 150–400)
RBC: 4.87 MIL/uL (ref 4.22–5.81)
RDW: 12.3 % (ref 11.5–15.5)
WBC Count: 6.7 K/uL (ref 4.0–10.5)
nRBC: 0 % (ref 0.0–0.2)

## 2024-03-19 LAB — TSH: TSH: 4.138 u[IU]/mL (ref 0.350–4.500)

## 2024-03-19 NOTE — Progress Notes (Signed)
 [facility] [address] [place],  [place]  [address] [phone]  Clinic Day:  03/19/2024   Referring physician: Jama Chow, MD  Patient Care Team: Patient Care Team: Keith Chow, MD as PCP - General (Internal Medicine) Keith Redell PARAS, MD as PCP - Cardiology (Cardiology)   REASON FOR CONSULTATION:  Thrombocytopenia  HISTORY OF PRESENT ILLNESS:   Keith Keith. is a 78 y.o. adult with a history of thrombocytopenia who is referred in consultation by Keith Jama, MD for assessment and management. He presents today with his daughter. He has had mild thrombocytopenia dating back to 2020. He does not believe he has had any workup for this, but does state he feels he has had a decline in his health since heart surgery in April 2020. He underwent CABG x 5. He also notes significant weight loss since his surgery and notes that over the last few years, his appetite has decreased. He recently lost his wife of 60 years in January and his grief is still evident and he notes this also makes it difficult for him to do day to day activities. He does have hearing and vision loss, but is unable to afford hearing aids or glasses. He notes shortness of breath and chest discomfort that radiates to his arm. This has been noted in the cardiologist note as well, so he is managing. He reports frequent diarrhea. He did have a colonoscopy in the past, unsure of when and was told to follow up, but did not. He denies fever, chills, nausea or vomiting. He denies issue with bladder. Medical history includes anxiety, CAD, chest pain, degenerative disc disease, Type 2 DM, dyslexia, hypertension, hyperlipidemia and GERD. Surgical history includes appendectomy, cholecystectomy, CABG x 5 and heart cath. Family history includes cancer, diabetes, heart attack and stroke. He is a widower and lives with his grandson. He formerly used smokeless tobacco (chew), but denies smoking, use of alcohol or other  drugs.    REVIEW OF SYSTEMS:   Review of Systems  Constitutional:  Positive for appetite change and unexpected weight change.  HENT:   Positive for hearing loss.   Eyes:  Positive for eye problems.  Respiratory:  Positive for cough and shortness of breath.   Cardiovascular:  Positive for chest pain.  Gastrointestinal:  Positive for diarrhea.  Endocrine: Negative.   Genitourinary: Negative.    Musculoskeletal:  Positive for back pain and gait problem.  Skin: Negative.   Neurological:  Positive for extremity weakness and gait problem.  Hematological:  Bruises/bleeds easily.  Psychiatric/Behavioral:  The patient is nervous/anxious.      VITALS:   There were no vitals taken for this visit.  Wt Readings from Last 3 Encounters:  03/05/24 152 lb 12.8 oz (69.3 kg)  08/08/23 159 lb 3.2 oz (72.2 kg)  09/11/22 163 lb (73.9 kg)    There is no height or weight on file to calculate BMI.  Performance status (ECOG): 1 - Symptomatic but completely ambulatory  PHYSICAL EXAM:   Physical Exam Constitutional:      Appearance: Normal appearance.  HENT:     Head: Normocephalic and atraumatic.     Mouth/Throat:     Mouth: Mucous membranes are moist.  Cardiovascular:     Rate and Rhythm: Normal rate and regular rhythm.     Pulses: Normal pulses.     Heart sounds: Normal heart sounds.  Pulmonary:     Effort: Pulmonary effort is normal.     Breath  sounds: Normal breath sounds.  Abdominal:     General: Abdomen is flat. Bowel sounds are normal.     Palpations: Abdomen is soft.  Musculoskeletal:        General: Normal range of motion.     Cervical back: Normal range of motion.  Skin:    General: Skin is warm and dry.  Neurological:     General: No focal deficit present.     Mental Status: He is alert and oriented to person, place, and time. Mental status is at baseline.  Psychiatric:        Mood and Affect: Mood normal.        Behavior: Behavior normal.        Thought Content:  Thought content normal.        Judgment: Judgment normal.      LABS:      Latest Ref Rng & Units 03/05/2024    8:17 AM 12/28/2021    9:04 AM 07/08/2021   10:13 AM  CBC  WBC 4.0 - 10.5 K/uL 5.0  5.4  13.3   Hemoglobin 13.0 - 17.0 g/dL 86.3  84.7  83.4   Hematocrit 39.0 - 52.0 % 39.7  44.6  47.9   Platelets 150 - 400 K/uL 89  106  144       Latest Ref Rng & Units 03/05/2024    8:17 AM 09/11/2022    8:13 AM 12/28/2021    9:04 AM  CMP  Glucose 70 - 99 mg/dL 860  818  849   BUN 8 - 23 mg/dL 14  22  14    Creatinine 0.61 - 1.24 mg/dL 9.14  9.05  9.13   Sodium 135 - 145 mmol/L 141  142  141   Potassium 3.5 - 5.1 mmol/L 4.1  3.9  4.0   Chloride 98 - 111 mmol/L 107  108  106   CO2 22 - 32 mmol/L 24  20  25    Calcium  8.9 - 10.3 mg/dL 8.9  8.6  8.9   Total Protein 6.5 - 8.1 g/dL 6.4  5.8  6.2   Total Bilirubin 0.0 - 1.2 mg/dL 1.3  0.9  1.5   Alkaline Phos 38 - 126 U/L 100  95  99   AST 15 - 41 U/L 38  26  29   ALT 0 - 44 U/L 24  20  19       No results found for: CEA1, CEA / No results found for: CEA1, CEA No results found for: PSA1 No results found for: CAN199 No results found for: CAN125  No results found for: TOTALPROTELP, ALBUMINELP, A1GS, A2GS, BETS, BETA2SER, GAMS, MSPIKE, SPEI No results found for: TIBC, FERRITIN, IRONPCTSAT No results found for: LDH  STUDIES:   No results found.    HISTORY:   Past Medical History:  Diagnosis Date   Anxiety 06/27/2016   Bilateral lower extremity edema 01/10/2019   CAD (coronary artery disease)    s/p CABGX5 10/2018. LHC 02/03/2019: patent LIMA, all SVG occluded, no target for PCI.    Chest pain 02/12/2019   Coronary artery disease involving native coronary artery of native heart with unstable angina pectoris (HCC) 01/01/2020   Last Assessment & Plan:  Formatting of this note might be different from the original. In summary the patient is a 78 year old man with severe three-vessel coronary artery  disease.  Severe coronary artery disease is a life threatening medical problem.  Treatment of choice is revascularization either by stent or  by bypass surgery particularly when there is 3 vessel disease.  In his case redo surger   Coronary artery disease of native artery of native heart with stable angina pectoris (HCC) 12/19/2018   DDD (degenerative disc disease), lumbar 06/27/2016   Diabetes mellitus without complication (HCC)    type 2   Dyslexia 06/27/2016   Essential hypertension 12/19/2018   Gastro-esophageal reflux disease without esophagitis 04/19/2021   Hyperlipidemia 12/19/2018   Hyperlipidemia LDL goal <70    Hypertension    Lumbosacral radiculopathy at L4 06/27/2016   Mixed hyperlipidemia 04/19/2021   NSTEMI (non-ST elevated myocardial infarction) (HCC) 11/19/2018   Osteoarthritis of multiple joints 06/27/2016   Prolonged Q-T interval on ECG 04/21/2021   Last Assessment & Plan:  Formatting of this note might be different from the original. Prolonged QT. Repeat EKG today reveals the QTC to be down to 432 milliseconds off Ranexa .  Plan: 1. Continue to hold Ranexa  and medications that prolong the QTc.   S/P CABG x 5 11/21/2018   Type 2 diabetes mellitus (HCC) 12/19/2018   Urinary frequency 01/10/2019    Past Surgical History:  Procedure Laterality Date   APPENDECTOMY     BACK SURGERY     CHOLECYSTECTOMY     CORONARY ANGIOPLASTY WITH STENT PLACEMENT  1998   CORONARY ARTERY BYPASS GRAFT N/A 11/21/2018   Procedure: CORONARY ARTERY BYPASS GRAFTING (CABG) TIMES FIVE USING LEFT INTERNAL MAMMARY ARTERY AND LEFT SAPHENOUS VEIN HARVESTED ENDOSCOPICALLY;  Surgeon: Lucas Dorise POUR, MD;  Location: [facility] OR;  Service: Open Heart Surgery;  Laterality: N/A;   LEFT HEART CATH AND CORONARY ANGIOGRAPHY N/A 11/19/2018   Procedure: LEFT HEART CATH AND CORONARY ANGIOGRAPHY;  Surgeon: Burnard Debby LABOR, MD;  Location: [facility] INVASIVE CV LAB;  Service: Cardiovascular;  Laterality: N/A;   LEFT HEART CATH AND CORS/GRAFTS  ANGIOGRAPHY N/A 02/03/2019   Procedure: LEFT HEART CATH AND CORS/GRAFTS ANGIOGRAPHY;  Surgeon: Mady Bruckner, MD;  Location: [facility] INVASIVE CV LAB;  Service: Cardiovascular;  Laterality: N/A;   TEE WITHOUT CARDIOVERSION N/A 11/21/2018   Procedure: TRANSESOPHAGEAL ECHOCARDIOGRAM (TEE);  Surgeon: Lucas Dorise POUR, MD;  Location: [facility] OR;  Service: Open Heart Surgery;  Laterality: N/A;    Family History  Problem Relation Age of Onset   Heart attack Mother    Stroke Mother    Diabetes Sister    Heart attack Sister    Stroke Sister    Cancer Sister     Social History:  reports that he has never smoked. He quit smokeless tobacco use about 5 years ago.  His smokeless tobacco use included chew. He reports that he does not currently use alcohol. He reports that he does not use drugs.The patient is accompanied by daughter today.  Allergies:  Allergies  Allergen Reactions   Atorvastatin  Other (See Comments)    Pt states he gets sick to his stomach and nauseous.    Lipitor  [Atorvastatin  Calcium ] Diarrhea   Penicillins Itching    Did it involve swelling of the face/tongue/throat, SOB, or low BP? Unknown Did it involve sudden or severe rash/hives, skin peeling, or any reaction on the inside of your mouth or nose? Unknown Did you need to seek medical attention at a hospital or doctor's office? Unknown When did it last happen?      UNK If all above answers are "NO", may proceed with cephalosporin use.     Current Medications: Current Outpatient Medications  Medication Sig Dispense Refill   amLODipine  (NORVASC ) 10 MG tablet TAKE 1  TABLET(10 MG) BY MOUTH DAILY 90 tablet 3   aspirin  EC 81 MG tablet Take 1 tablet (81 mg total) by mouth daily. 90 tablet 3   cloNIDine  (CATAPRES ) 0.2 MG tablet Take 1 tablet (0.2 mg total) by mouth at bedtime. 90 tablet 2   isosorbide  mononitrate (IMDUR ) 120 MG 24 hr tablet Take 1 tablet (120 mg total) by mouth daily. 90 tablet 3   metoprolol  tartrate (LOPRESSOR ) 25 MG  tablet TAKE 1 TABLET BY MOUTH TWICE DAILY 180 tablet 2   nitroGLYCERIN  (NITROSTAT ) 0.4 MG SL tablet Place 1 tablet (0.4 mg total) under the tongue every 5 (five) minutes as needed for chest pain. 25 tablet 5   omeprazole (PRILOSEC) 20 MG capsule Take 20 mg by mouth every morning.     ranolazine  (RANEXA ) 500 MG 12 hr tablet Take 1 tablet (500 mg total) by mouth 2 (two) times daily. 180 tablet 2   rosuvastatin  (CRESTOR ) 40 MG tablet TAKE 1 TABLET(40 MG) BY MOUTH DAILY 90 tablet 3   No current facility-administered medications for this visit.     ASSESSMENT & PLAN:   Assessment:  Murat Rideout. is a 78 y.o. adult with long term history of mild thrombocytopenia as far back to at least 2020. He did undergo CABG x 5 in April 2020 and has found recovery to be difficult. He lost significant weight after surgery and has not been able to gain any back. He notes his appetite has decreased, especially since he lost his wife in January. They were married for 60 years. He lives with his grandson who helps to manage his medications. He tries to be very independent in his daily activities but is recently noticing instability when ambulating and has had several falls. He has access to a cane and walker, but is hesitant to use them. I urged him to try again as this may help him keep some of his independence. Platelet count today is improving; 96 from 89. All other testing is negative. We discussed that he does not need to follow with us , as he has stated he really doesn't want to return for more appointments. I did encourage him to continue follow up with his PCP. Plan: 1.  Return PRN  I discussed the assessment and treatment plan with the patient.  The patient was provided an opportunity to ask questions and all were answered.  The patient agreed with the plan and demonstrated an understanding of the instructions.    Thank you for the referral    20 minutes was spent in patient care.  This included time  spent preparing to see the patient (e.g., review of tests), obtaining and/or reviewing separately obtained history, counseling and educating the patient/family/caregiver, ordering medications, tests, or procedures; documenting clinical information in the electronic or other health record, independently interpreting results and communicating results to the patient/family/caregiver as well as coordination of care.      Eleanor DELENA Bach, NP   Family Nurse Practitioner - Board Certified [facility]  [phone]

## 2024-04-21 DIAGNOSIS — R0981 Nasal congestion: Secondary | ICD-10-CM | POA: Diagnosis not present

## 2024-04-21 DIAGNOSIS — R519 Headache, unspecified: Secondary | ICD-10-CM | POA: Diagnosis not present

## 2024-04-21 DIAGNOSIS — R051 Acute cough: Secondary | ICD-10-CM | POA: Diagnosis not present

## 2024-04-21 DIAGNOSIS — R070 Pain in throat: Secondary | ICD-10-CM | POA: Diagnosis not present

## 2024-04-21 DIAGNOSIS — U071 COVID-19: Secondary | ICD-10-CM | POA: Diagnosis not present

## 2024-05-28 ENCOUNTER — Other Ambulatory Visit: Payer: Self-pay

## 2024-08-25 ENCOUNTER — Other Ambulatory Visit: Payer: Self-pay | Admitting: Cardiology

## 2024-08-25 ENCOUNTER — Other Ambulatory Visit: Payer: Self-pay

## 2024-08-26 ENCOUNTER — Other Ambulatory Visit: Payer: Self-pay | Admitting: Cardiology

## 2024-08-27 ENCOUNTER — Other Ambulatory Visit: Payer: Self-pay
